# Patient Record
Sex: Female | Born: 1965 | Race: White | Hispanic: No | Marital: Married | Smoking: Former smoker
Health system: Southern US, Community
[De-identification: ages and names within clinical notes are randomized; demographics above are authoritative.]

## PROBLEM LIST (undated history)

## (undated) DIAGNOSIS — Z9889 Other specified postprocedural states: Secondary | ICD-10-CM

## (undated) DIAGNOSIS — M1990 Unspecified osteoarthritis, unspecified site: Secondary | ICD-10-CM

## (undated) DIAGNOSIS — K219 Gastro-esophageal reflux disease without esophagitis: Secondary | ICD-10-CM

## (undated) DIAGNOSIS — T4145XA Adverse effect of unspecified anesthetic, initial encounter: Secondary | ICD-10-CM

## (undated) DIAGNOSIS — R112 Nausea with vomiting, unspecified: Secondary | ICD-10-CM

## (undated) DIAGNOSIS — F32A Depression, unspecified: Secondary | ICD-10-CM

## (undated) DIAGNOSIS — B019 Varicella without complication: Secondary | ICD-10-CM

## (undated) DIAGNOSIS — F329 Major depressive disorder, single episode, unspecified: Secondary | ICD-10-CM

## (undated) DIAGNOSIS — T8859XA Other complications of anesthesia, initial encounter: Secondary | ICD-10-CM

## (undated) DIAGNOSIS — R05 Cough: Secondary | ICD-10-CM

## (undated) DIAGNOSIS — G43909 Migraine, unspecified, not intractable, without status migrainosus: Secondary | ICD-10-CM

## (undated) HISTORY — DX: Varicella without complication: B01.9

## (undated) HISTORY — DX: Unspecified osteoarthritis, unspecified site: M19.90

## (undated) HISTORY — DX: Migraine, unspecified, not intractable, without status migrainosus: G43.909

## (undated) HISTORY — PX: APPENDECTOMY: SHX54

## (undated) HISTORY — PX: TUBAL LIGATION: SHX77

---
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
## 1998-01-12 ENCOUNTER — Encounter: Payer: Self-pay | Admitting: Emergency Medicine

## 1998-01-12 ENCOUNTER — Emergency Department (HOSPITAL_COMMUNITY): Admission: EM | Admit: 1998-01-12 | Discharge: 1998-01-12 | Payer: Self-pay | Admitting: Emergency Medicine

## 2000-03-28 ENCOUNTER — Other Ambulatory Visit: Admission: RE | Admit: 2000-03-28 | Discharge: 2000-03-28 | Payer: Self-pay | Admitting: Internal Medicine

## 2001-03-29 ENCOUNTER — Other Ambulatory Visit: Admission: RE | Admit: 2001-03-29 | Discharge: 2001-03-29 | Payer: Self-pay | Admitting: Internal Medicine

## 2002-09-26 ENCOUNTER — Other Ambulatory Visit: Admission: RE | Admit: 2002-09-26 | Discharge: 2002-09-26 | Payer: Self-pay | Admitting: Internal Medicine

## 2003-09-30 ENCOUNTER — Emergency Department (HOSPITAL_COMMUNITY): Admission: EM | Admit: 2003-09-30 | Discharge: 2003-09-30 | Payer: Self-pay | Admitting: Emergency Medicine

## 2003-11-27 ENCOUNTER — Other Ambulatory Visit: Admission: RE | Admit: 2003-11-27 | Discharge: 2003-11-27 | Payer: Self-pay | Admitting: Internal Medicine

## 2004-01-07 ENCOUNTER — Encounter: Admission: RE | Admit: 2004-01-07 | Discharge: 2004-01-07 | Payer: Self-pay | Admitting: Internal Medicine

## 2004-05-21 ENCOUNTER — Ambulatory Visit (HOSPITAL_COMMUNITY): Admission: RE | Admit: 2004-05-21 | Discharge: 2004-05-21 | Payer: Self-pay | Admitting: Obstetrics and Gynecology

## 2004-06-20 ENCOUNTER — Emergency Department (HOSPITAL_COMMUNITY): Admission: EM | Admit: 2004-06-20 | Discharge: 2004-06-20 | Payer: Self-pay | Admitting: Emergency Medicine

## 2004-08-02 ENCOUNTER — Emergency Department (HOSPITAL_COMMUNITY): Admission: EM | Admit: 2004-08-02 | Discharge: 2004-08-02 | Payer: Self-pay | Admitting: Emergency Medicine

## 2005-03-01 HISTORY — PX: CERVICAL ABLATION: SHX5771

## 2005-08-03 ENCOUNTER — Other Ambulatory Visit: Admission: RE | Admit: 2005-08-03 | Discharge: 2005-08-03 | Payer: Self-pay | Admitting: Internal Medicine

## 2005-08-30 ENCOUNTER — Other Ambulatory Visit: Admission: RE | Admit: 2005-08-30 | Discharge: 2005-08-30 | Payer: Self-pay | Admitting: Obstetrics and Gynecology

## 2005-10-03 ENCOUNTER — Emergency Department (HOSPITAL_COMMUNITY): Admission: EM | Admit: 2005-10-03 | Discharge: 2005-10-03 | Payer: Self-pay | Admitting: Emergency Medicine

## 2006-10-19 ENCOUNTER — Other Ambulatory Visit: Admission: RE | Admit: 2006-10-19 | Discharge: 2006-10-19 | Payer: Self-pay | Admitting: Internal Medicine

## 2007-07-11 ENCOUNTER — Encounter: Admission: RE | Admit: 2007-07-11 | Discharge: 2007-07-11 | Payer: Self-pay | Admitting: Internal Medicine

## 2007-09-27 ENCOUNTER — Encounter: Admission: RE | Admit: 2007-09-27 | Discharge: 2007-09-27 | Payer: Self-pay | Admitting: Internal Medicine

## 2007-10-10 ENCOUNTER — Encounter: Admission: RE | Admit: 2007-10-10 | Discharge: 2007-10-10 | Payer: Self-pay | Admitting: Internal Medicine

## 2007-11-20 ENCOUNTER — Emergency Department (HOSPITAL_COMMUNITY): Admission: EM | Admit: 2007-11-20 | Discharge: 2007-11-20 | Payer: Self-pay | Admitting: Emergency Medicine

## 2008-08-05 ENCOUNTER — Encounter: Admission: RE | Admit: 2008-08-05 | Discharge: 2008-08-05 | Payer: Self-pay | Admitting: Internal Medicine

## 2008-11-14 ENCOUNTER — Ambulatory Visit (HOSPITAL_BASED_OUTPATIENT_CLINIC_OR_DEPARTMENT_OTHER): Admission: RE | Admit: 2008-11-14 | Discharge: 2008-11-14 | Payer: Self-pay | Admitting: Urology

## 2009-01-06 ENCOUNTER — Emergency Department (HOSPITAL_BASED_OUTPATIENT_CLINIC_OR_DEPARTMENT_OTHER): Admission: EM | Admit: 2009-01-06 | Discharge: 2009-01-06 | Payer: Self-pay | Admitting: Emergency Medicine

## 2009-07-09 ENCOUNTER — Other Ambulatory Visit: Admission: RE | Admit: 2009-07-09 | Discharge: 2009-07-09 | Payer: Self-pay | Admitting: Internal Medicine

## 2010-02-09 ENCOUNTER — Encounter
Admission: RE | Admit: 2010-02-09 | Discharge: 2010-02-09 | Payer: Self-pay | Source: Home / Self Care | Attending: Internal Medicine | Admitting: Internal Medicine

## 2010-03-21 ENCOUNTER — Encounter: Payer: Self-pay | Admitting: Internal Medicine

## 2010-03-22 ENCOUNTER — Encounter: Payer: Self-pay | Admitting: Internal Medicine

## 2010-07-14 ENCOUNTER — Other Ambulatory Visit: Payer: Self-pay | Admitting: Internal Medicine

## 2010-07-14 ENCOUNTER — Other Ambulatory Visit (HOSPITAL_COMMUNITY)
Admission: RE | Admit: 2010-07-14 | Discharge: 2010-07-14 | Disposition: A | Discharge status: Home / Self Care | Payer: Self-pay | Source: Ambulatory Visit | Attending: Internal Medicine | Admitting: Internal Medicine

## 2010-07-14 DIAGNOSIS — Z01419 Encounter for gynecological examination (general) (routine) without abnormal findings: Secondary | ICD-10-CM | POA: Insufficient documentation

## 2010-07-17 NOTE — Op Note (Signed)
NAMEFLAVIA, BRUSS                ACCOUNT NO.:  [id number]   MEDICAL RECORD NO.:  [id number]          PATIENT TYPE:  AMB   LOCATION:  SDC                           FACILITY:  [facility]   PHYSICIAN:  Charles A. Delcambre, MDDATE OF BIRTH:  [birth year]   DATE OF PROCEDURE:  05/21/2004  DATE OF DISCHARGE:                                 OPERATIVE REPORT   PREOPERATIVE DIAGNOSIS:  Menorrhagia.   POSTOPERATIVE DIAGNOSIS:  Menorrhagia.   PROCEDURE:  Thermachoice endometrial ablation.   SURGEON:  Dr. Sydnee Cabal   ASSISTANT:  None.   COMPLICATIONS:  None.   ESTIMATED BLOOD LOSS:  Minimal.   SPECIMENS:  None.   ANESTHESIA:  General via endotracheal route.   COUNTS:  Sponge and instrument count correct.   DESCRIPTION OF PROCEDURE:  The patient was taken to the operating room and  placed in supine position and anesthesia given without difficulty.  She was  then placed in dorsolithotomy position in universal stirrups, and sterile  prep and draping was done.  A single-tooth tenaculum was placed on the  anterior lip of the cervix.  A weighted speculum was placed in the vagina.  Sound was noted to 8 cm.  Thermochoice instrument was used with Thermochoice  protocol.  With pressures between 160 and 180 mmHg, heating cycle was  followed, and then 8 minutes of treatment cycle was followed with pressures  noted to be maintained above 150 mmHg during this cycle.  The procedure was  terminated.  Cool down cycle was followed, and instruments were removed.  There was no evidence of perforation during sounding or treatment with the  Thermochoice.  All instruments were removed, and patient was awakened and  taken to recovery room with the physician in attendance, having tolerated  the procedure well.      CAD/MEDQ  D:  05/21/2004  T:  05/21/2004  Job:  [id number]

## 2010-07-17 NOTE — H&P (Signed)
Brittany Pham, Brittany Pham                ACCOUNT NO.:  [id number]   MEDICAL RECORD NO.:  [id number]           PATIENT TYPE:   LOCATION:                                 FACILITY:   PHYSICIAN:  Charles A. Delcambre, MDDATE OF BIRTH:  August 04, 1965   DATE OF ADMISSION:  05/21/2004  DATE OF DISCHARGE:                                HISTORY & PHYSICAL   REASON FOR ADMISSION:  ThermaChoice endometrial ablation for menorrhagia.   HISTORY OF PRESENT ILLNESS:  She is a 45 year old gravida 4, para 2-0-2-2,  periods regular every 28 days, gone to 21 days now over the last several  months and very heavy over the last two months.  She had two periods in  January, and then again after about 10-15 days in February lasting 9 days.  She has become increasingly fatigued over the last several months.  Hemoglobin 04/30/2004, however, was 14.1.  TSH was 1.48.   PAST MEDICAL HISTORY:  1.  Depression.  2.  Anxiety.  3.  Chronic back aches.   SURGICAL HISTORY:  Tubal ligation, nose surgery.   OBSTETRICAL HISTORY:  SVD x2.   MEDICATIONS:  1.  Citalopram 20 mg daily for depression.  2.  Xanax p.r.n., dose not specified.  3.  Ibuprofen p.r.n. for back ache.   ALLERGIES:  No known drug allergies.   SOCIAL HISTORY:  One half pack per day [place] for four years.  Denies  alcohol or drug use or STD exposure in the past.  Does not state sexual  activity or spouse.   FAMILY HISTORY:  Father deceased of colon question, questionable  hypertension.  Deceased, she states now, myocardial infarction, but also  colon cancer in the mid 72's.  Hypertension also.  Mother 78 with ruptured  disks in the back.  Sister 99 in good health.  Otherwise, negative family  history.   REVIEW OF SYSTEMS:  Some hot flashes and malaise in addition to things noted  positive above.  Otherwise, no fever, chills, rashes, lesions, headaches,  dizziness, chest pain, shortness of breath, wheezing, diarrhea,  constipation, bleeding,  melena, hematochezia, urgency, frequency, dysuria,  incontinence, hematuria, galacturia, emotional changes.   PHYSICAL EXAMINATION:  GENERAL APPEARANCE:  Alert and oriented x3.  Blood  pressure 110/70, respirations 16, pulse 76.  Weight 149 pounds, height 5  feet 4-1/2 inches.  HEENT:  Grossly within normal limits.  NECK:  Without thyromegaly or adenopathy.  LUNGS:  Clear bilaterally.  HEART:  Regular rate without murmurs, rubs or gallops.  BREASTS:  Symmetrical, otherwise, deferred.  Reports normal.  Exam last fall  with PCP on 10/2003, she has not had her mammogram.  ABDOMEN:  Soft but nontender.  No hepatosplenomegaly.  No other masses  noted.  PELVIC:  Normal external female genitalia.  Bartholin, urethral, Skene  normal.  Vault without discharge lesions.  No cervical motion tenderness  present.  Multipara cervix.  Uterus anteverted, not enlarged.  Adnexa not  tender without masses.  Bilateral ovaries normal size.  Ultrasound  examination consistent with physical findings.  Small follicular cyst.  Less  than 1.5  cm of the right ovary and left less than 1 cm.  Endometrial  thickness, 0.5 cm now in the week after menstrual cycle.   ASSESSMENT:  Menorrhagia.   PLAN:  ThermaChoice endometrial ablation.  Informed consent was given.  She  accepts risks of infection, bleeding, uterine perforation, failure of  endometrial ablation.  Failure of procedure, as well as, failure to control  heavy bleeding.  Preoperative UCG will be done.  All questions were  answered.  She will remain n.p.o. past midnight for solid foods.  Otherwise,  clear liquids have until 500.  Procedure scheduled for 05/21/2004.  ThermaChoice literature was given.  Questions were answered, and we will  proceed as outlined.  Endometrial biopsy is done today per protocol.   ADDENDUM:  Endometrial biopsy is done today per protocol.  Sound was to 8  cm.  No evidence perforation.  Patient tolerated procedure well.       CAD/MEDQ  D:  05/14/2004  T:  05/14/2004  Job:  [id number]

## 2010-11-30 LAB — URINALYSIS, ROUTINE W REFLEX MICROSCOPIC
Glucose, UA: NEGATIVE
Nitrite: NEGATIVE
pH: 6

## 2010-11-30 LAB — COMPREHENSIVE METABOLIC PANEL
ALT: 17
AST: 17
Alkaline Phosphatase: 53
CO2: 25
GFR calc Af Amer: >60
Glucose, Bld: 109 — ABNORMAL HIGH
Potassium: 3.5
Sodium: 133 — ABNORMAL LOW
Total Protein: 6.5

## 2010-11-30 LAB — CBC
Hemoglobin: 14.6
RBC: 4.64
RDW: 13.5

## 2010-11-30 LAB — DIFFERENTIAL
Basophils Relative: 0
Eosinophils Absolute: 0.1
Eosinophils Relative: 1
Lymphs Abs: 0.7
Monocytes Relative: 1 — ABNORMAL LOW
Neutrophils Relative %: 93 — ABNORMAL HIGH

## 2010-11-30 LAB — PREGNANCY, URINE: Preg Test, Ur: NEGATIVE

## 2011-02-17 ENCOUNTER — Other Ambulatory Visit: Payer: Self-pay | Admitting: Internal Medicine

## 2011-02-17 DIAGNOSIS — Z1231 Encounter for screening mammogram for malignant neoplasm of breast: Secondary | ICD-10-CM

## 2011-03-08 ENCOUNTER — Ambulatory Visit
Admission: RE | Admit: 2011-03-08 | Discharge: 2011-03-08 | Disposition: A | Discharge status: Home / Self Care | Payer: BC Managed Care – PPO | Source: Ambulatory Visit | Attending: Internal Medicine | Admitting: Internal Medicine

## 2011-03-08 DIAGNOSIS — Z1231 Encounter for screening mammogram for malignant neoplasm of breast: Secondary | ICD-10-CM

## 2011-09-09 ENCOUNTER — Other Ambulatory Visit: Payer: Self-pay | Admitting: Internal Medicine

## 2011-09-09 ENCOUNTER — Other Ambulatory Visit (HOSPITAL_COMMUNITY)
Admission: RE | Admit: 2011-09-09 | Discharge: 2011-09-09 | Disposition: A | Discharge status: Home / Self Care | Payer: BC Managed Care – PPO | Source: Ambulatory Visit | Attending: Internal Medicine | Admitting: Internal Medicine

## 2011-09-09 DIAGNOSIS — Z01419 Encounter for gynecological examination (general) (routine) without abnormal findings: Secondary | ICD-10-CM | POA: Insufficient documentation

## 2011-09-09 DIAGNOSIS — K30 Functional dyspepsia: Secondary | ICD-10-CM

## 2011-09-16 ENCOUNTER — Inpatient Hospital Stay: Admission: RE | Admit: 2011-09-16 | Payer: BC Managed Care – PPO | Source: Ambulatory Visit

## 2012-03-01 HISTORY — PX: CERVICAL SPINE SURGERY: SHX589

## 2012-04-20 ENCOUNTER — Other Ambulatory Visit: Payer: Self-pay | Admitting: Surgery

## 2012-06-19 ENCOUNTER — Other Ambulatory Visit: Payer: Self-pay

## 2012-06-19 DIAGNOSIS — Z1231 Encounter for screening mammogram for malignant neoplasm of breast: Secondary | ICD-10-CM

## 2012-07-14 ENCOUNTER — Ambulatory Visit
Admission: RE | Admit: 2012-07-14 | Discharge: 2012-07-14 | Disposition: A | Discharge status: Home / Self Care | Payer: BC Managed Care – PPO | Source: Ambulatory Visit

## 2012-07-14 DIAGNOSIS — Z1231 Encounter for screening mammogram for malignant neoplasm of breast: Secondary | ICD-10-CM

## 2012-09-14 ENCOUNTER — Other Ambulatory Visit (HOSPITAL_COMMUNITY)
Admission: RE | Admit: 2012-09-14 | Discharge: 2012-09-14 | Disposition: A | Discharge status: Home / Self Care | Payer: BC Managed Care – PPO | Source: Ambulatory Visit | Attending: Internal Medicine | Admitting: Internal Medicine

## 2012-09-14 ENCOUNTER — Other Ambulatory Visit: Payer: Self-pay | Admitting: Internal Medicine

## 2012-09-14 DIAGNOSIS — Z1151 Encounter for screening for human papillomavirus (HPV): Secondary | ICD-10-CM | POA: Insufficient documentation

## 2012-09-14 DIAGNOSIS — Z01419 Encounter for gynecological examination (general) (routine) without abnormal findings: Secondary | ICD-10-CM | POA: Insufficient documentation

## 2012-09-22 ENCOUNTER — Ambulatory Visit
Admission: RE | Admit: 2012-09-22 | Discharge: 2012-09-22 | Disposition: A | Discharge status: Home / Self Care | Payer: BC Managed Care – PPO | Source: Ambulatory Visit | Attending: Internal Medicine | Admitting: Internal Medicine

## 2012-09-22 DIAGNOSIS — K30 Functional dyspepsia: Secondary | ICD-10-CM

## 2012-12-25 ENCOUNTER — Emergency Department (HOSPITAL_BASED_OUTPATIENT_CLINIC_OR_DEPARTMENT_OTHER)
Admission: EM | Admit: 2012-12-25 | Discharge: 2012-12-25 | Disposition: A | Discharge status: Home / Self Care | Payer: BC Managed Care – PPO | Attending: Emergency Medicine | Admitting: Emergency Medicine

## 2012-12-25 ENCOUNTER — Emergency Department (HOSPITAL_BASED_OUTPATIENT_CLINIC_OR_DEPARTMENT_OTHER): Payer: BC Managed Care – PPO

## 2012-12-25 ENCOUNTER — Encounter (HOSPITAL_BASED_OUTPATIENT_CLINIC_OR_DEPARTMENT_OTHER): Payer: Self-pay | Admitting: Emergency Medicine

## 2012-12-25 DIAGNOSIS — R5381 Other malaise: Secondary | ICD-10-CM | POA: Insufficient documentation

## 2012-12-25 DIAGNOSIS — F3289 Other specified depressive episodes: Secondary | ICD-10-CM | POA: Insufficient documentation

## 2012-12-25 DIAGNOSIS — R531 Weakness: Secondary | ICD-10-CM

## 2012-12-25 DIAGNOSIS — R61 Generalized hyperhidrosis: Secondary | ICD-10-CM | POA: Insufficient documentation

## 2012-12-25 DIAGNOSIS — F17200 Nicotine dependence, unspecified, uncomplicated: Secondary | ICD-10-CM | POA: Insufficient documentation

## 2012-12-25 DIAGNOSIS — Z79899 Other long term (current) drug therapy: Secondary | ICD-10-CM | POA: Insufficient documentation

## 2012-12-25 DIAGNOSIS — R110 Nausea: Secondary | ICD-10-CM | POA: Insufficient documentation

## 2012-12-25 DIAGNOSIS — IMO0002 Reserved for concepts with insufficient information to code with codable children: Secondary | ICD-10-CM | POA: Insufficient documentation

## 2012-12-25 DIAGNOSIS — F329 Major depressive disorder, single episode, unspecified: Secondary | ICD-10-CM | POA: Insufficient documentation

## 2012-12-25 HISTORY — DX: Major depressive disorder, single episode, unspecified: F32.9

## 2012-12-25 HISTORY — DX: Depression, unspecified: F32.A

## 2012-12-25 LAB — URINE MICROSCOPIC-ADD ON

## 2012-12-25 LAB — CBC WITH DIFFERENTIAL/PLATELET
Basophils Absolute: 0 10*3/uL (ref 0.0–0.1)
Lymphs Abs: 6 10*3/uL — ABNORMAL HIGH (ref 0.7–4.0)
MCV: 89.6 fL (ref 78.0–100.0)
Monocytes Absolute: 0.9 10*3/uL (ref 0.1–1.0)
Monocytes Relative: 8 % (ref 3–12)
Neutrophils Relative %: 39 % — ABNORMAL LOW (ref 43–77)
Platelets: 403 10*3/uL — ABNORMAL HIGH (ref 150–400)
RDW: 13.7 % (ref 11.5–15.5)
WBC: 11.5 10*3/uL — ABNORMAL HIGH (ref 4.0–10.5)

## 2012-12-25 LAB — BASIC METABOLIC PANEL
BUN: 20 mg/dL (ref 6–23)
Calcium: 9.2 mg/dL (ref 8.4–10.5)
Chloride: 102 mEq/L (ref 96–112)
Creatinine, Ser: 1 mg/dL (ref 0.50–1.10)
GFR calc Af Amer: 78 mL/min — ABNORMAL LOW (ref >90)
GFR calc non Af Amer: 67 mL/min — ABNORMAL LOW (ref >90)
Potassium: 3.9 mEq/L (ref 3.5–5.1)
Sodium: 138 mEq/L (ref 135–145)

## 2012-12-25 LAB — URINALYSIS, ROUTINE W REFLEX MICROSCOPIC
Hgb urine dipstick: NEGATIVE
Protein, ur: NEGATIVE mg/dL
Urobilinogen, UA: 0.2 mg/dL (ref 0.0–1.0)
pH: 6 (ref 5.0–8.0)

## 2012-12-25 LAB — GLUCOSE, CAPILLARY: Glucose-Capillary: 69 mg/dL — ABNORMAL LOW (ref 70–99)

## 2012-12-25 MED ORDER — SODIUM CHLORIDE 0.9 % IV SOLN: 1000.0000 mL | INTRAVENOUS | Status: DC

## 2012-12-25 MED ORDER — SODIUM CHLORIDE 0.9 % IV SOLN
1000.0000 mL | Freq: Once | INTRAVENOUS | Status: AC
  Administered 2012-12-25: 1000 mL via INTRAVENOUS

## 2012-12-25 NOTE — ED Notes (Signed)
Patient transported to X-ray 

## 2012-12-25 NOTE — ED Notes (Signed)
Per EMS:  Pt went outside to smoke.  Got diaphoretic, dizzy.  Lasted about an hour.  Denies syncopal episode.  Pt being treated for sinus infection.  Pt is nauseated and has dry heaved.

## 2012-12-25 NOTE — ED Provider Notes (Signed)
CSN: [id number]     Arrival date & time 12/25/12  1036 History   First MD Initiated Contact with Patient 12/25/12 1145     Chief Complaint  Patient presents with  . Dizziness   (Consider location/radiation/quality/duration/timing/severity/associated sxs/prior Treatment) HPI A 47 year old female presents today after an episode of feeling lightheaded and sweaty. She has had upper respiratory infection symptoms have been treated for a sinus infection with moxifloxacin. She was at work today when she went outside to smoke and then began to feel generally weak, slightly nauseated, and became somewhat sweaty. She feels somewhat improved here but still feels generally weak. She's had no vision changes, difficulty speaking, lateralized weakness. She has no history of similar symptoms. Denies fever, chills, chest pain, dyspnea, or abdominal pain. Past Medical History  Diagnosis Date  . Depression    History reviewed. No pertinent past surgical history. History reviewed. No pertinent family history. History  Substance Use Topics  . Smoking status: Current Every Day Smoker  . Smokeless tobacco: Not on file  . Alcohol Use: No   OB History   Grav Para Term Preterm Abortions TAB SAB Ect Mult Living                 Review of Systems  All other systems reviewed and are negative.    Allergies  Review of patient's allergies indicates no known allergies.  Home Medications   Current Outpatient Rx  Name  Route  Sig  Dispense  Refill  . DULoxetine (CYMBALTA) 20 MG capsule   Oral   Take 20 mg by mouth daily.         . predniSONE (STERAPRED UNI-PAK) 10 MG tablet   Oral   Take 10 mg by mouth daily.          BP 115/79  Pulse 95  Temp(Src) 98 F (36.7 C) (Oral)  Resp 20  SpO2 98% Physical Exam  Nursing note and vitals reviewed. Constitutional: She is oriented to person, place, and time. She appears well-developed and well-nourished.  HENT:  Head: Normocephalic and atraumatic.   Right Ear: Tympanic membrane and external ear normal.  Left Ear: Tympanic membrane and external ear normal.  Nose: Nose normal. Right sinus exhibits no maxillary sinus tenderness and no frontal sinus tenderness. Left sinus exhibits no maxillary sinus tenderness and no frontal sinus tenderness.  Eyes: Conjunctivae and EOM are normal. Pupils are equal, round, and reactive to light. Right eye exhibits no nystagmus. Left eye exhibits no nystagmus.  Neck: Normal range of motion. Neck supple.  Cardiovascular: Normal rate, regular rhythm, normal heart sounds and intact distal pulses.   Pulmonary/Chest: Effort normal and breath sounds normal. No respiratory distress. She exhibits no tenderness.  Abdominal: Soft. Bowel sounds are normal. She exhibits no distension and no mass. There is no tenderness.  Musculoskeletal: Normal range of motion. She exhibits no edema and no tenderness.  Neurological: She is alert and oriented to person, place, and time. She has normal strength and normal reflexes. No sensory deficit. She displays a negative Romberg sign. GCS eye subscore is 4. GCS verbal subscore is 5. GCS motor subscore is 6.  Reflex Scores:      Tricep reflexes are 2+ on the right side and 2+ on the left side.      Bicep reflexes are 2+ on the right side and 2+ on the left side.      Brachioradialis reflexes are 2+ on the right side and 2+ on the left side.  Patellar reflexes are 2+ on the right side and 2+ on the left side.      Achilles reflexes are 2+ on the right side and 2+ on the left side. Patient with normal gait without ataxia, shuffling, spasm, or antalgia. Speech is normal without dysarthria, dysphasia, or aphasia. Muscle strength is 5/5 in bilateral shoulders, elbow flexor and extensors, wrist flexor and extensors, and intrinsic hand muscles. 5/5 bilateral lower extremity hip flexors, extensors, knee flexors and extensors, and ankle dorsi and plantar flexors.    Skin: Skin is warm and dry.  No rash noted.  Psychiatric: She has a normal mood and affect. Her behavior is normal. Judgment and thought content normal.    ED Course  Procedures (including critical care time) Labs Review Labs Reviewed  URINALYSIS, ROUTINE W REFLEX MICROSCOPIC - Abnormal; Notable for the following:    APPearance CLOUDY (*)    Leukocytes, UA LARGE (*)    All other components within normal limits  CBC WITH DIFFERENTIAL - Abnormal; Notable for the following:    WBC 11.5 (*)    Platelets 403 (*)    Neutrophils Relative % 39 (*)    Lymphocytes Relative 52 (*)    Lymphs Abs 6.0 (*)    All other components within normal limits  BASIC METABOLIC PANEL - Abnormal; Notable for the following:    GFR calc non Af Amer 67 (*)    GFR calc Af Amer 78 (*)    All other components within normal limits  GLUCOSE, CAPILLARY - Abnormal; Notable for the following:    Glucose-Capillary 69 (*)    All other components within normal limits  URINE MICROSCOPIC-ADD ON - Abnormal; Notable for the following:    Squamous Epithelial / LPF MANY (*)    Bacteria, UA FEW (*)    All other components within normal limits  URINE CULTURE  CBC  PATHOLOGIST SMEAR REVIEW  POCT CBG (FASTING - GLUCOSE)-MANUAL ENTRY   Imaging Review No results found.  EKG Interpretation     Ventricular Rate:  72 PR Interval:  132 QRS Duration: 78 QT Interval:  394 QTC Calculation: 431 R Axis:   27 Text Interpretation:  Normal sinus rhythm Normal ECG            MDM  No diagnosis found. Patient with generalized weakness with some orthostatic changes. EKG and labs all appear basically normal. The patient has received a normal saline and continues to feel improved from her prior episode although not her usual self. She denies any specific pain and has a normal neurological exam. He does not have any UTI symptoms and is on the seventh day of moxifloxacin. She's advised return if she feels worse anytime and to followup with her primary care  Dr. Given her atypical lymphocytosis I think this is possibly a viral syndrome, but again she is advised to return or she is worse at any time.   Hilario Quarry, MD 12/25/12 [phone]

## 2012-12-26 LAB — URINE CULTURE: Colony Count: 8000

## 2012-12-27 LAB — PATHOLOGIST SMEAR REVIEW

## 2013-03-28 ENCOUNTER — Other Ambulatory Visit: Payer: Self-pay | Admitting: Chiropractic Medicine

## 2013-03-28 ENCOUNTER — Ambulatory Visit
Admission: RE | Admit: 2013-03-28 | Discharge: 2013-03-28 | Disposition: A | Discharge status: Home / Self Care | Payer: BC Managed Care – PPO | Source: Ambulatory Visit | Attending: Chiropractic Medicine | Admitting: Chiropractic Medicine

## 2013-03-28 DIAGNOSIS — M542 Cervicalgia: Secondary | ICD-10-CM

## 2013-09-19 ENCOUNTER — Other Ambulatory Visit: Payer: Self-pay

## 2013-09-19 DIAGNOSIS — Z1231 Encounter for screening mammogram for malignant neoplasm of breast: Secondary | ICD-10-CM

## 2013-09-24 ENCOUNTER — Ambulatory Visit
Admission: RE | Admit: 2013-09-24 | Discharge: 2013-09-24 | Disposition: A | Discharge status: Home / Self Care | Payer: BC Managed Care – PPO | Source: Ambulatory Visit

## 2013-09-24 DIAGNOSIS — Z1231 Encounter for screening mammogram for malignant neoplasm of breast: Secondary | ICD-10-CM

## 2014-09-08 ENCOUNTER — Encounter (HOSPITAL_BASED_OUTPATIENT_CLINIC_OR_DEPARTMENT_OTHER): Payer: Self-pay | Admitting: Emergency Medicine

## 2014-09-08 ENCOUNTER — Emergency Department (HOSPITAL_BASED_OUTPATIENT_CLINIC_OR_DEPARTMENT_OTHER): Payer: BLUE CROSS/BLUE SHIELD

## 2014-09-08 ENCOUNTER — Emergency Department (HOSPITAL_BASED_OUTPATIENT_CLINIC_OR_DEPARTMENT_OTHER)
Admission: EM | Admit: 2014-09-08 | Discharge: 2014-09-08 | Disposition: A | Discharge status: Home / Self Care | Payer: BLUE CROSS/BLUE SHIELD | Attending: Emergency Medicine | Admitting: Emergency Medicine

## 2014-09-08 DIAGNOSIS — F329 Major depressive disorder, single episode, unspecified: Secondary | ICD-10-CM | POA: Diagnosis not present

## 2014-09-08 DIAGNOSIS — Z720 Tobacco use: Secondary | ICD-10-CM | POA: Insufficient documentation

## 2014-09-08 DIAGNOSIS — R55 Syncope and collapse: Secondary | ICD-10-CM | POA: Insufficient documentation

## 2014-09-08 DIAGNOSIS — R197 Diarrhea, unspecified: Secondary | ICD-10-CM

## 2014-09-08 DIAGNOSIS — K529 Noninfective gastroenteritis and colitis, unspecified: Secondary | ICD-10-CM

## 2014-09-08 DIAGNOSIS — R109 Unspecified abdominal pain: Secondary | ICD-10-CM | POA: Diagnosis present

## 2014-09-08 DIAGNOSIS — R05 Cough: Secondary | ICD-10-CM | POA: Insufficient documentation

## 2014-09-08 DIAGNOSIS — E869 Volume depletion, unspecified: Secondary | ICD-10-CM | POA: Insufficient documentation

## 2014-09-08 DIAGNOSIS — Z7952 Long term (current) use of systemic steroids: Secondary | ICD-10-CM | POA: Insufficient documentation

## 2014-09-08 DIAGNOSIS — Z79899 Other long term (current) drug therapy: Secondary | ICD-10-CM | POA: Diagnosis not present

## 2014-09-08 DIAGNOSIS — R059 Cough, unspecified: Secondary | ICD-10-CM

## 2014-09-08 DIAGNOSIS — R112 Nausea with vomiting, unspecified: Secondary | ICD-10-CM

## 2014-09-08 LAB — CBC WITH DIFFERENTIAL/PLATELET
BASOS PCT: 0 % (ref 0–1)
Basophils Absolute: 0 10*3/uL (ref 0.0–0.1)
Eosinophils Absolute: 0.4 10*3/uL (ref 0.0–0.7)
Eosinophils Relative: 5 % (ref 0–5)
HEMATOCRIT: 41.7 % (ref 36.0–46.0)
Hemoglobin: 14.1 g/dL (ref 12.0–15.0)
Lymphocytes Relative: 39 % (ref 12–46)
Lymphs Abs: 3.1 10*3/uL (ref 0.7–4.0)
MCH: 30.2 pg (ref 26.0–34.0)
MCHC: 33.8 g/dL (ref 30.0–36.0)
MCV: 89.3 fL (ref 78.0–100.0)
MONO ABS: 0.7 10*3/uL (ref 0.1–1.0)
Monocytes Relative: 8 % (ref 3–12)
Neutro Abs: 3.8 10*3/uL (ref 1.7–7.7)
Neutrophils Relative %: 48 % (ref 43–77)
Platelets: 347 10*3/uL (ref 150–400)
RBC: 4.67 MIL/uL (ref 3.87–5.11)
RDW: 12.9 % (ref 11.5–15.5)
WBC: 8 10*3/uL (ref 4.0–10.5)

## 2014-09-08 LAB — BASIC METABOLIC PANEL
Anion gap: 8 (ref 5–15)
BUN: 6 mg/dL (ref 6–20)
CALCIUM: 8.1 mg/dL — AB (ref 8.9–10.3)
CO2: 25 mmol/L (ref 22–32)
Chloride: 105 mmol/L (ref 101–111)
Creatinine, Ser: 0.77 mg/dL (ref 0.44–1.00)
GFR calc Af Amer: >60 mL/min (ref >60)
GFR calc non Af Amer: >60 mL/min (ref >60)
Glucose, Bld: 108 mg/dL — ABNORMAL HIGH (ref 65–99)
Potassium: 3.6 mmol/L (ref 3.5–5.1)
Sodium: 138 mmol/L (ref 135–145)

## 2014-09-08 MED ORDER — MORPHINE SULFATE 2 MG/ML IJ SOLN
2.0000 mg | Freq: Once | INTRAMUSCULAR | Status: AC
  Administered 2014-09-08: 2 mg via INTRAVENOUS
  Filled 2014-09-08: qty 1

## 2014-09-08 MED ORDER — SODIUM CHLORIDE 0.9 % IV BOLUS (SEPSIS)
1000.0000 mL | Freq: Once | INTRAVENOUS | Status: AC
  Administered 2014-09-08: 1000 mL via INTRAVENOUS

## 2014-09-08 MED ORDER — METOCLOPRAMIDE HCL 5 MG/ML IJ SOLN
10.0000 mg | Freq: Once | INTRAMUSCULAR | Status: AC
  Administered 2014-09-08: 10 mg via INTRAVENOUS
  Filled 2014-09-08: qty 2

## 2014-09-08 MED ORDER — PROMETHAZINE HCL 25 MG PO TABS: 25.0000 mg | ORAL_TABLET | Freq: Four times a day (QID) | ORAL | Status: DC | PRN

## 2014-09-08 MED ORDER — ONDANSETRON HCL 4 MG/2ML IJ SOLN
4.0000 mg | Freq: Once | INTRAMUSCULAR | Status: AC
  Administered 2014-09-08: 4 mg via INTRAVENOUS
  Filled 2014-09-08: qty 2

## 2014-09-08 NOTE — ED Notes (Signed)
Pt updated, denies pain or nausea at this time, pt alert, NAD, calm, interactive, no dyspnea noted, returned to monitor, VSS, family at [facility].

## 2014-09-08 NOTE — ED Provider Notes (Signed)
CSN: [id number]     Arrival date & time 09/08/14  1497 History  This chart was scribed for Pattricia Boss, MD by Chester Holstein, ED Scribe. This patient was seen in room MH11/MH11 and the patient's care was started at 6:50 PM.    Chief Complaint  Patient presents with  . Abdominal Pain     The history is provided by the patient and the spouse. No language interpreter was used.   HPI Comments: AUNIKA KIRSTEN is a 49 y.o. female who presents to the Emergency Department complaining of abdominal pain with onset 2 days ago. She notes productive cough with green sputum and sore throat with onset 4 days ago. Pt was seen at an urgent care 2 days ago and treated for URI with prednisone, inhaler, and Amoxicillin with resolution of sore throat at that time.  Husband reports syncopal episode following administration of prednisone. Pt presenting today with dysphonia, nausea, vomiting, and diarrhea with onset 2 days ago. Pt is a smoker. Pt denies any other recent Abx use. She denies fever and chills. No recent sick contacts.    Past Medical History  Diagnosis Date  . Depression    History reviewed. No pertinent past surgical history. History reviewed. No pertinent family history. History  Substance Use Topics  . Smoking status: Current Every Day Smoker  . Smokeless tobacco: Not on file  . Alcohol Use: No   OB History    No data available     Review of Systems  Constitutional: Negative for fever and chills.  HENT: Positive for sore throat (resolved) and voice change.   Respiratory: Positive for cough.   Gastrointestinal: Positive for nausea, vomiting, abdominal pain and diarrhea.  Neurological: Positive for syncope.  All other systems reviewed and are negative.     Allergies  Erythromycin and Prednisone  Home Medications   Prior to Admission medications   Medication Sig Start Date End Date Taking? Authorizing Provider  DULoxetine (CYMBALTA) 20 MG capsule Take 20 mg by mouth daily.     Historical Provider, MD  predniSONE (STERAPRED UNI-PAK) 10 MG tablet Take 10 mg by mouth daily.    Historical Provider, MD   BP 115/91 mmHg  Pulse 115  Temp(Src) 98.2 F (36.8 C) (Oral)  Resp 18  SpO2 97% Physical Exam  Constitutional: She is oriented to person, place, and time. She appears well-developed and well-nourished.  HENT:  Head: Normocephalic.  Eyes: Conjunctivae are normal.  Neck: Normal range of motion. Neck supple.  Pulmonary/Chest: Effort normal.  Musculoskeletal: Normal range of motion.  Neurological: She is alert and oriented to person, place, and time.  Skin: Skin is warm and dry.  Psychiatric: She has a normal mood and affect. Her behavior is normal.  Nursing note and vitals reviewed.   ED Course  Procedures (including critical care time) DIAGNOSTIC STUDIES: Oxygen Saturation is 97% on room air, normal by my interpretation.    COORDINATION OF CARE: 6:57 PM Discussed treatment plan with patient at beside, the patient agrees with the plan and has no further questions at this time.   Labs Review Labs Reviewed  CBC WITH DIFFERENTIAL/PLATELET  BASIC METABOLIC PANEL    Imaging Review No results found.   EKG Interpretation None      MDM   Final diagnoses:  Cough  Nausea vomiting and diarrhea  Gastroenteritis  Volume depletion   49 year old female who comes in today stating that she has had URI symptoms began on Thursday followed by nausea vomiting or  diarrhea. She was seen on Friday and started on amoxicillin. She has vomited multiple times today and had multiple episodes diarrhea. She feels generally weak. She reports a syncopal episode on Friday. She is given IV hydration here has labs and chest x-Tattiana Fakhouri that are normal. She is advised on oral rehydration, follow-up, and return precautions and voices understanding. I personally performed the services described in this documentation, which was scribed in my presence. The recorded information has been  reviewed and considered. t}     Pattricia Boss, MD 09/08/14 2249

## 2014-09-08 NOTE — ED Notes (Signed)
Up to b/r, steady gait, denies pain nausea dizziness or other sx. No changes, family at [facility], VSS.

## 2014-09-08 NOTE — ED Notes (Signed)
patioent was treated at an urgent care on Friday for an URI and syncopal episodes. The patient reports that she feels weak all over and is unable to "keep anything down"

## 2014-09-08 NOTE — Discharge Instructions (Signed)
Viral Gastroenteritis Viral gastroenteritis is also called stomach flu. This illness is caused by a certain type of germ (virus). It can cause sudden watery poop (diarrhea) and throwing up (vomiting). This can cause you to lose body fluids (dehydration). This illness usually lasts for 3 to 8 days. It usually goes away on its own. HOME CARE   Drink enough fluids to keep your pee (urine) clear or pale yellow. Drink small amounts of fluids often.  Ask your doctor how to replace body fluid losses (rehydration).  Avoid:  Foods high in sugar.  Alcohol.  Bubbly (carbonated) drinks.  Tobacco.  Juice.  Caffeine drinks.  Very hot or cold fluids.  Fatty, greasy foods.  Eating too much at one time.  Dairy products until 24 to 48 hours after your watery poop stops.  You may eat foods with active cultures (probiotics). They can be found in some yogurts and supplements.  Wash your hands well to avoid spreading the illness.  Only take medicines as told by your doctor. Do not give aspirin to children. Do not take medicines for watery poop (antidiarrheals).  Ask your doctor if you should keep taking your regular medicines.  Keep all doctor visits as told. GET HELP RIGHT AWAY IF:   You cannot keep fluids down.  You do not pee at least once every 6 to 8 hours.  You are short of breath.  You see blood in your poop or throw up. This may look like coffee grounds.  You have belly (abdominal) pain that gets worse or is just in one small spot (localized).  You keep throwing up or having watery poop.  You have a fever.  The patient is a child younger than 3 months, and he or she has a fever.  The patient is a child older than 3 months, and he or she has a fever and problems that do not go away.  The patient is a child older than 3 months, and he or she has a fever and problems that suddenly get worse.  The patient is a baby, and he or she has no tears when crying. MAKE SURE YOU:     Understand these instructions.  Will watch your condition.  Will get help right away if you are not doing well or get worse. Document Released: 08/04/2007 Document Revised: 05/10/2011 Document Reviewed: 12/02/2010 [facility] Patient Information 2015 [place], [place]. This information is not intended to replace advice given to you by your health care provider. Make sure you discuss any questions you have with your health care provider.

## 2014-09-08 NOTE — ED Notes (Signed)
Dr. Jeanell Sparrow at [facility].

## 2015-10-22 ENCOUNTER — Other Ambulatory Visit (HOSPITAL_COMMUNITY)
Admission: RE | Admit: 2015-10-22 | Discharge: 2015-10-22 | Disposition: A | Discharge status: Home / Self Care | Payer: Managed Care, Other (non HMO) | Source: Ambulatory Visit | Attending: Internal Medicine | Admitting: Internal Medicine

## 2015-10-22 ENCOUNTER — Other Ambulatory Visit: Payer: Self-pay | Admitting: Internal Medicine

## 2015-10-22 DIAGNOSIS — Z01419 Encounter for gynecological examination (general) (routine) without abnormal findings: Secondary | ICD-10-CM | POA: Diagnosis present

## 2015-10-22 DIAGNOSIS — Z1151 Encounter for screening for human papillomavirus (HPV): Secondary | ICD-10-CM | POA: Insufficient documentation

## 2015-10-24 LAB — CYTOLOGY - PAP

## 2016-08-27 ENCOUNTER — Other Ambulatory Visit: Payer: Self-pay | Admitting: Internal Medicine

## 2016-08-27 DIAGNOSIS — Z1231 Encounter for screening mammogram for malignant neoplasm of breast: Secondary | ICD-10-CM

## 2016-09-02 ENCOUNTER — Ambulatory Visit
Admission: RE | Admit: 2016-09-02 | Discharge: 2016-09-02 | Disposition: A | Discharge status: Home / Self Care | Payer: Commercial Managed Care - PPO | Source: Ambulatory Visit | Attending: Internal Medicine | Admitting: Internal Medicine

## 2016-09-02 DIAGNOSIS — Z1231 Encounter for screening mammogram for malignant neoplasm of breast: Secondary | ICD-10-CM

## 2016-09-06 ENCOUNTER — Other Ambulatory Visit: Payer: Self-pay | Admitting: Internal Medicine

## 2016-09-06 DIAGNOSIS — R928 Other abnormal and inconclusive findings on diagnostic imaging of breast: Secondary | ICD-10-CM

## 2016-09-07 ENCOUNTER — Other Ambulatory Visit: Payer: Self-pay | Admitting: Internal Medicine

## 2016-09-07 DIAGNOSIS — R928 Other abnormal and inconclusive findings on diagnostic imaging of breast: Secondary | ICD-10-CM

## 2016-09-08 ENCOUNTER — Other Ambulatory Visit: Payer: Commercial Managed Care - PPO

## 2016-09-08 ENCOUNTER — Ambulatory Visit
Admission: RE | Admit: 2016-09-08 | Discharge: 2016-09-08 | Disposition: A | Discharge status: Home / Self Care | Payer: Commercial Managed Care - PPO | Source: Ambulatory Visit | Attending: Internal Medicine | Admitting: Internal Medicine

## 2016-09-08 DIAGNOSIS — R928 Other abnormal and inconclusive findings on diagnostic imaging of breast: Secondary | ICD-10-CM

## 2016-09-14 ENCOUNTER — Other Ambulatory Visit: Payer: Self-pay | Admitting: Internal Medicine

## 2016-09-22 ENCOUNTER — Ambulatory Visit (INDEPENDENT_AMBULATORY_CARE_PROVIDER_SITE_OTHER): Payer: Commercial Managed Care - PPO | Admitting: Primary Care

## 2016-09-22 ENCOUNTER — Ambulatory Visit (INDEPENDENT_AMBULATORY_CARE_PROVIDER_SITE_OTHER)
Admission: RE | Admit: 2016-09-22 | Discharge: 2016-09-22 | Disposition: A | Discharge status: Home / Self Care | Payer: Commercial Managed Care - PPO | Source: Ambulatory Visit | Attending: Primary Care | Admitting: Primary Care

## 2016-09-22 ENCOUNTER — Encounter: Payer: Self-pay | Admitting: Primary Care

## 2016-09-22 VITALS — BP 120/78 | HR 72 | Temp 98.0°F | Ht 62.5 in | Wt 187.8 lb

## 2016-09-22 DIAGNOSIS — G43709 Chronic migraine without aura, not intractable, without status migrainosus: Secondary | ICD-10-CM

## 2016-09-22 DIAGNOSIS — G43909 Migraine, unspecified, not intractable, without status migrainosus: Secondary | ICD-10-CM | POA: Insufficient documentation

## 2016-09-22 DIAGNOSIS — Z0001 Encounter for general adult medical examination with abnormal findings: Secondary | ICD-10-CM | POA: Insufficient documentation

## 2016-09-22 DIAGNOSIS — Z0000 Encounter for general adult medical examination without abnormal findings: Secondary | ICD-10-CM

## 2016-09-22 DIAGNOSIS — G8929 Other chronic pain: Secondary | ICD-10-CM | POA: Diagnosis not present

## 2016-09-22 DIAGNOSIS — M542 Cervicalgia: Secondary | ICD-10-CM

## 2016-09-22 DIAGNOSIS — F419 Anxiety disorder, unspecified: Secondary | ICD-10-CM | POA: Diagnosis not present

## 2016-09-22 DIAGNOSIS — F329 Major depressive disorder, single episode, unspecified: Secondary | ICD-10-CM | POA: Diagnosis not present

## 2016-09-22 DIAGNOSIS — F32A Depression, unspecified: Secondary | ICD-10-CM

## 2016-09-22 DIAGNOSIS — IMO0002 Reserved for concepts with insufficient information to code with codable children: Secondary | ICD-10-CM

## 2016-09-22 MED ORDER — SUMATRIPTAN SUCCINATE 50 MG PO TABS: ORAL_TABLET | ORAL | 3 refills | Status: DC

## 2016-09-22 MED ORDER — DULOXETINE HCL 30 MG PO CPEP: 30.0000 mg | ORAL_CAPSULE | Freq: Every day | ORAL | 3 refills | Status: DC

## 2016-09-22 NOTE — Assessment & Plan Note (Signed)
Well managed on Cymbalta, refills provided today.

## 2016-09-22 NOTE — Patient Instructions (Addendum)
Complete lab work and xray prior to leaving today.   I sent refills of your Cymbalta and sumatriptan to your pharmacy.  It's important to improve your diet by reducing consumption of fast food, fried food, processed snack foods, sugary drinks. Increase consumption of fresh vegetables and fruits, whole grains, water.  Ensure you are drinking 64 ounces of water daily.  Start exercising. You should be getting 150 minutes of moderate intensity exercise weekly.  We will be in touch once we receive your lab results.  It was a pleasure to meet you today! Please don't hesitate to call me with any questions. Welcome to [facility]!

## 2016-09-22 NOTE — Assessment & Plan Note (Signed)
Immunizations UTD. Pap and mammogram UTD. Discussed the importance of a healthy diet and regular exercise in order for weight loss, and to reduce the risk of other medical problems. Exam overall unremarkable. Labs pending. Follow up in 1 year for annual exam.

## 2016-09-22 NOTE — Assessment & Plan Note (Signed)
Chronic. Muscle tension noted to left posterior neck. No spinal tenderness. Will check xray today to rule out abnormality of hardware. Recommended she follow up with her surgeon.

## 2016-09-22 NOTE — Assessment & Plan Note (Signed)
Using sumatriptan 3-4 times monthly, excedrin migraine weekly. Has had increased stress with family issues. Neuro exam today unremarkable.

## 2016-09-22 NOTE — Progress Notes (Signed)
Subjective:    Patient ID: Brittany Pham, female    DOB: 1966/01/19, 51 y.o.   MRN: [id number]  HPI  Brittany Pham is a 51 year old female who presents today to establish care, for complete physical, and discuss the problems mentioned below. Will obtain old records.  1) Migraines: Currently managed on Excedrin migraines and Sumatriptan. She takes the Sumatriptan 3-4 times monthly on average, sometimes takes the second dose. She has weekly headaches and will use the Excedrin migraine.   2) Anxiety and Depression: Currently managed on Cymbalta 30 mg for which she's taken for years. Overall feels well managed. Denies SI/HI.   Immunizations: -Tetanus: Completed in 2016 -Influenza: Did complete last season.   Diet: She endorses a poor diet. Breakfast: Skips Lunch: Skips mostly, crackers Dinner: Meat, vegetable, starch, cereal  Snacks: Protein bar Desserts: Daily, ice cream Beverages: Coffee, Coke-working on cutting back, some water.  Exercise: She is not exercising.  Eye exam: Completed in May 2018 Dental exam: Completes semi-annually Pap Smear: Completed in 2017, negative Mammogram: Completed in 2018, ultrasound completed. Will repeat in 6 months.   Review of Systems  Constitutional: Negative for unexpected weight change.  HENT: Negative for rhinorrhea.   Respiratory: Negative for cough and shortness of breath.   Cardiovascular: Negative for chest pain.  Gastrointestinal: Negative for constipation and diarrhea.  Genitourinary: Negative for difficulty urinating and menstrual problem.       Hot flashes  Musculoskeletal: Positive for arthralgias, myalgias and neck pain.       History of cervical neck surgery with plates and screws. Chronic neck pain since. Muscle tension.  Skin: Negative for rash.  Allergic/Immunologic: Negative for environmental allergies.  Neurological: Negative for dizziness, numbness and headaches.  Psychiatric/Behavioral:       Doing well on Cymbalta        Past Medical History:  Diagnosis Date  . Arthritis   . Chickenpox   . Depression   . Migraines      Social History   Social History  . Marital status: Married    Spouse name: N/A  . Number of children: N/A  . Years of education: N/A   Occupational History  . Not on file.   Social History Main Topics  . Smoking status: Current Every Day Smoker  . Smokeless tobacco: Never Used  . Alcohol use No  . Drug use: No  . Sexual activity: Not on file   Other Topics Concern  . Not on file   Social History Narrative   Married.   2 children, 4 step children, 1 grandchild.    Works in [employer].    Enjoys swimming, spending time with family.     Past Surgical History:  Procedure Laterality Date  . CERVICAL ABLATION  2007  . CERVICAL SPINE SURGERY  2014    Family History  Problem Relation Age of Onset  . Arthritis Mother   . Breast cancer Mother   . Heart disease Father   . Arthritis Maternal Grandfather   . Lung cancer Maternal Grandfather     Allergies  Allergen Reactions  . Erythromycin Nausea And Vomiting  . Prednisone Other (See Comments)    Passes out     No current outpatient prescriptions on file prior to visit.   No current facility-administered medications on file prior to visit.     BP 120/78   Pulse 72   Temp 98 F (36.7 C) (Oral)   Ht 5' 2.5" (1.588 m)   Wt  187 lb 12.8 oz (85.2 kg)   BMI 33.80 kg/m    Objective:   Physical Exam  Constitutional: She is oriented to person, place, and time. She appears well-nourished.  HENT:  Right Ear: Tympanic membrane and ear canal normal.  Left Ear: Tympanic membrane and ear canal normal.  Nose: Nose normal.  Mouth/Throat: Oropharynx is clear and moist.  Eyes: Pupils are equal, round, and reactive to light. Conjunctivae and EOM are normal.  Neck: Neck supple. No thyromegaly present.    Tenderness to left posterior neck. No spinal tenderness.  Cardiovascular: Normal rate and regular rhythm.    No murmur heard. Pulmonary/Chest: Effort normal and breath sounds normal. She has no rales.  Abdominal: Soft. Bowel sounds are normal. There is no tenderness.  Musculoskeletal: Normal range of motion.  Lymphadenopathy:    She has no cervical adenopathy.  Neurological: She is alert and oriented to person, place, and time. She has normal reflexes. No cranial nerve deficit.  Skin: Skin is warm and dry.  Psychiatric: She has a normal mood and affect.          Assessment & Plan:

## 2016-09-23 LAB — COMPREHENSIVE METABOLIC PANEL
ALBUMIN: 4.4 g/dL (ref 3.5–5.2)
ALK PHOS: 43 U/L (ref 39–117)
ALT: 17 U/L (ref 0–35)
AST: 14 U/L (ref 0–37)
BUN: 10 mg/dL (ref 6–23)
CALCIUM: 9.4 mg/dL (ref 8.4–10.5)
CO2: 26 mEq/L (ref 19–32)
Chloride: 105 mEq/L (ref 96–112)
Creatinine, Ser: 0.99 mg/dL (ref 0.40–1.20)
GFR: 63.28 mL/min (ref >60.00)
GLUCOSE: 82 mg/dL (ref 70–99)
POTASSIUM: 4.2 meq/L (ref 3.5–5.1)
Sodium: 137 mEq/L (ref 135–145)
TOTAL PROTEIN: 7.1 g/dL (ref 6.0–8.3)
Total Bilirubin: 0.3 mg/dL (ref 0.2–1.2)

## 2016-09-23 LAB — LIPID PANEL
CHOLESTEROL: 194 mg/dL (ref 0–200)
HDL: 46.3 mg/dL (ref >39.00)
LDL Cholesterol: 127 mg/dL — ABNORMAL HIGH (ref 0–99)
NonHDL: 148.01
TRIGLYCERIDES: 106 mg/dL (ref 0.0–149.0)
Total CHOL/HDL Ratio: 4
VLDL: 21.2 mg/dL (ref 0.0–40.0)

## 2016-09-23 LAB — HEMOGLOBIN A1C: Hgb A1c MFr Bld: 6 % (ref 4.6–6.5)

## 2016-09-24 ENCOUNTER — Encounter: Payer: Self-pay | Admitting: *Deleted

## 2016-09-27 ENCOUNTER — Encounter: Payer: Self-pay | Admitting: *Deleted

## 2017-01-07 ENCOUNTER — Ambulatory Visit: Payer: Self-pay

## 2017-01-07 NOTE — Telephone Encounter (Signed)
I spoke with Mr Brittany Pham and pt is dressing to go to ED now.FYI to Avie Echevaria NP.

## 2017-01-07 NOTE — Telephone Encounter (Signed)
Pt is wheezing, has central chest pain and cough and congestion has lasted >2 weeks.  Reason for Disposition . Chest pain  (Exception: MILD central chest pain, present only when coughing)  Answer Assessment - Initial Assessment Questions 1. ONSET: "When did the cough begin?"      2 weeks ago 2. SEVERITY: "How bad is the cough today?"      Severe cough with wheezing 3. RESPIRATORY DISTRESS: "Describe your breathing."      Husband thinks she is breathing faster wheezing 4. FEVER: "Do you have a fever?" If so, ask: "What is your temperature, how was it measured, and when did it start?"     No buts feels clammy 5. SPUTUM: "Describe the color of your sputum" (clear, white, yellow, green)     yellow 6. HEMOPTYSIS: "Are you coughing up any blood?" If so ask: "How much?" (flecks, streaks, tablespoons, etc.)     no 7. CARDIAC HISTORY: "Do you have any history of heart disease?" (e.g., heart attack, congestive heart failure)      no 8. LUNG HISTORY: "Do you have any history of lung disease?"  (e.g., pulmonary embolus, asthma, emphysema)     No  9. PE RISK FACTORS: "Do you have a history of blood clots?" (or: recent major surgery, recent prolonged travel, bedridden )     no 10. OTHER SYMPTOMS: "Do you have any other symptoms?" (e.g., runny nose, wheezing, chest pain)       Runny nose wheezing chest pain with cough and without coughing 11. PREGNANCY: "Is there any chance you are pregnant?" "When was your last menstrual period?"       No N/a 12. TRAVEL: "Have you traveled out of the country in the last month?" (e.g., travel history, exposures)       no  Protocols used: [place]

## 2017-07-04 DIAGNOSIS — M5414 Radiculopathy, thoracic region: Secondary | ICD-10-CM | POA: Diagnosis not present

## 2017-07-04 DIAGNOSIS — M791 Myalgia, unspecified site: Secondary | ICD-10-CM | POA: Diagnosis not present

## 2017-07-08 DIAGNOSIS — M791 Myalgia, unspecified site: Secondary | ICD-10-CM | POA: Diagnosis not present

## 2017-07-08 DIAGNOSIS — M5414 Radiculopathy, thoracic region: Secondary | ICD-10-CM | POA: Diagnosis not present

## 2017-07-14 ENCOUNTER — Other Ambulatory Visit: Payer: Self-pay | Admitting: Primary Care

## 2017-07-14 DIAGNOSIS — IMO0002 Reserved for concepts with insufficient information to code with codable children: Secondary | ICD-10-CM

## 2017-07-14 DIAGNOSIS — G43709 Chronic migraine without aura, not intractable, without status migrainosus: Secondary | ICD-10-CM

## 2017-07-18 ENCOUNTER — Other Ambulatory Visit: Payer: Self-pay | Admitting: Primary Care

## 2017-07-18 DIAGNOSIS — M5414 Radiculopathy, thoracic region: Secondary | ICD-10-CM | POA: Diagnosis not present

## 2017-07-18 DIAGNOSIS — N6310 Unspecified lump in the right breast, unspecified quadrant: Secondary | ICD-10-CM

## 2017-07-18 DIAGNOSIS — M791 Myalgia, unspecified site: Secondary | ICD-10-CM | POA: Diagnosis not present

## 2017-07-27 DIAGNOSIS — M791 Myalgia, unspecified site: Secondary | ICD-10-CM | POA: Diagnosis not present

## 2017-07-27 DIAGNOSIS — M5414 Radiculopathy, thoracic region: Secondary | ICD-10-CM | POA: Diagnosis not present

## 2017-08-01 DIAGNOSIS — M791 Myalgia, unspecified site: Secondary | ICD-10-CM | POA: Diagnosis not present

## 2017-08-01 DIAGNOSIS — M5414 Radiculopathy, thoracic region: Secondary | ICD-10-CM | POA: Diagnosis not present

## 2017-08-12 DIAGNOSIS — M791 Myalgia, unspecified site: Secondary | ICD-10-CM | POA: Diagnosis not present

## 2017-08-12 DIAGNOSIS — M5414 Radiculopathy, thoracic region: Secondary | ICD-10-CM | POA: Diagnosis not present

## 2017-08-19 DIAGNOSIS — M5414 Radiculopathy, thoracic region: Secondary | ICD-10-CM | POA: Diagnosis not present

## 2017-08-19 DIAGNOSIS — M791 Myalgia, unspecified site: Secondary | ICD-10-CM | POA: Diagnosis not present

## 2017-08-27 ENCOUNTER — Other Ambulatory Visit: Payer: Self-pay | Admitting: Primary Care

## 2017-08-27 DIAGNOSIS — IMO0002 Reserved for concepts with insufficient information to code with codable children: Secondary | ICD-10-CM

## 2017-08-27 DIAGNOSIS — G43709 Chronic migraine without aura, not intractable, without status migrainosus: Secondary | ICD-10-CM

## 2017-08-29 DIAGNOSIS — M791 Myalgia, unspecified site: Secondary | ICD-10-CM | POA: Diagnosis not present

## 2017-08-29 DIAGNOSIS — M5414 Radiculopathy, thoracic region: Secondary | ICD-10-CM | POA: Diagnosis not present

## 2017-08-29 NOTE — Telephone Encounter (Signed)
Refilled times one in pcp absence  

## 2017-08-29 NOTE — Telephone Encounter (Signed)
[facility] pt, she is out of the office... Rx last filled 07/15/2017 #10... Please advise

## 2017-09-05 DIAGNOSIS — M5414 Radiculopathy, thoracic region: Secondary | ICD-10-CM | POA: Diagnosis not present

## 2017-09-05 DIAGNOSIS — M791 Myalgia, unspecified site: Secondary | ICD-10-CM | POA: Diagnosis not present

## 2017-09-06 ENCOUNTER — Ambulatory Visit
Admission: RE | Admit: 2017-09-06 | Discharge: 2017-09-06 | Disposition: A | Discharge status: Home / Self Care | Payer: BLUE CROSS/BLUE SHIELD | Source: Ambulatory Visit | Attending: Primary Care | Admitting: Primary Care

## 2017-09-06 DIAGNOSIS — N6310 Unspecified lump in the right breast, unspecified quadrant: Secondary | ICD-10-CM

## 2017-09-06 DIAGNOSIS — R928 Other abnormal and inconclusive findings on diagnostic imaging of breast: Secondary | ICD-10-CM | POA: Diagnosis not present

## 2017-09-19 ENCOUNTER — Ambulatory Visit (INDEPENDENT_AMBULATORY_CARE_PROVIDER_SITE_OTHER): Payer: BLUE CROSS/BLUE SHIELD | Admitting: Primary Care

## 2017-09-19 ENCOUNTER — Encounter: Payer: Commercial Managed Care - PPO | Admitting: Primary Care

## 2017-09-19 ENCOUNTER — Other Ambulatory Visit: Payer: Self-pay | Admitting: Primary Care

## 2017-09-19 ENCOUNTER — Encounter: Payer: Self-pay | Admitting: Primary Care

## 2017-09-19 VITALS — BP 118/78 | HR 82 | Temp 98.1°F | Ht 62.5 in | Wt 202.5 lb

## 2017-09-19 DIAGNOSIS — R7303 Prediabetes: Secondary | ICD-10-CM

## 2017-09-19 DIAGNOSIS — IMO0002 Reserved for concepts with insufficient information to code with codable children: Secondary | ICD-10-CM

## 2017-09-19 DIAGNOSIS — F419 Anxiety disorder, unspecified: Secondary | ICD-10-CM

## 2017-09-19 DIAGNOSIS — M791 Myalgia, unspecified site: Secondary | ICD-10-CM | POA: Diagnosis not present

## 2017-09-19 DIAGNOSIS — Z1211 Encounter for screening for malignant neoplasm of colon: Secondary | ICD-10-CM

## 2017-09-19 DIAGNOSIS — F329 Major depressive disorder, single episode, unspecified: Secondary | ICD-10-CM

## 2017-09-19 DIAGNOSIS — M5414 Radiculopathy, thoracic region: Secondary | ICD-10-CM | POA: Diagnosis not present

## 2017-09-19 DIAGNOSIS — M542 Cervicalgia: Secondary | ICD-10-CM

## 2017-09-19 DIAGNOSIS — G43709 Chronic migraine without aura, not intractable, without status migrainosus: Secondary | ICD-10-CM | POA: Diagnosis not present

## 2017-09-19 DIAGNOSIS — R519 Headache, unspecified: Secondary | ICD-10-CM | POA: Insufficient documentation

## 2017-09-19 DIAGNOSIS — Z0000 Encounter for general adult medical examination without abnormal findings: Secondary | ICD-10-CM | POA: Diagnosis not present

## 2017-09-19 DIAGNOSIS — F32A Depression, unspecified: Secondary | ICD-10-CM

## 2017-09-19 DIAGNOSIS — G8929 Other chronic pain: Secondary | ICD-10-CM

## 2017-09-19 DIAGNOSIS — R51 Headache: Secondary | ICD-10-CM

## 2017-09-19 DIAGNOSIS — A6000 Herpesviral infection of urogenital system, unspecified: Secondary | ICD-10-CM

## 2017-09-19 DIAGNOSIS — N393 Stress incontinence (female) (male): Secondary | ICD-10-CM

## 2017-09-19 DIAGNOSIS — N951 Menopausal and female climacteric states: Secondary | ICD-10-CM | POA: Insufficient documentation

## 2017-09-19 LAB — COMPREHENSIVE METABOLIC PANEL
ALT: 22 U/L (ref 0–35)
AST: 13 U/L (ref 0–37)
Albumin: 4.3 g/dL (ref 3.5–5.2)
Alkaline Phosphatase: 50 U/L (ref 39–117)
BUN: 13 mg/dL (ref 6–23)
CO2: 29 mEq/L (ref 19–32)
Calcium: 9.3 mg/dL (ref 8.4–10.5)
Chloride: 103 mEq/L (ref 96–112)
Creatinine, Ser: 0.84 mg/dL (ref 0.40–1.20)
GFR: 76.18 mL/min (ref >60.00)
GLUCOSE: 106 mg/dL — AB (ref 70–99)
POTASSIUM: 4.2 meq/L (ref 3.5–5.1)
SODIUM: 139 meq/L (ref 135–145)
Total Bilirubin: 0.4 mg/dL (ref 0.2–1.2)
Total Protein: 7.2 g/dL (ref 6.0–8.3)

## 2017-09-19 LAB — TSH: TSH: 1.01 u[IU]/mL (ref 0.35–4.50)

## 2017-09-19 LAB — LIPID PANEL
CHOL/HDL RATIO: 5
Cholesterol: 202 mg/dL — ABNORMAL HIGH (ref 0–200)
HDL: 43.5 mg/dL (ref >39.00)
LDL CALC: 136 mg/dL — AB (ref 0–99)
NONHDL: 158.29
Triglycerides: 113 mg/dL (ref 0.0–149.0)
VLDL: 22.6 mg/dL (ref 0.0–40.0)

## 2017-09-19 LAB — HEMOGLOBIN A1C: HEMOGLOBIN A1C: 6.2 % (ref 4.6–6.5)

## 2017-09-19 MED ORDER — ACYCLOVIR 400 MG PO TABS
ORAL_TABLET | ORAL | 0 refills | Status: DC
Start: 2017-09-19 — End: 2018-01-21

## 2017-09-19 MED ORDER — TOPIRAMATE 50 MG PO TABS: ORAL_TABLET | ORAL | 1 refills | Status: DC

## 2017-09-19 MED ORDER — DULOXETINE HCL 30 MG PO CPEP: 30.0000 mg | ORAL_CAPSULE | Freq: Every day | ORAL | 3 refills | Status: DC

## 2017-09-19 NOTE — Assessment & Plan Note (Addendum)
Triggered by chronic neck pain. Located to left occipatal and behind left eye. Occurring 3-4 times monthly, using Sumatriptan once weekly. Daily headaches. Has never been on preventative treatment for headaches.    Rx for Topamax 50 mg tablets for preventative treatment provided today. Discussed potential side effects and directions for administration. She will update in a few weeks.

## 2017-09-19 NOTE — Assessment & Plan Note (Addendum)
Typical outbreaks occur every 6-8 months on average. Using acyclovir PRN. Refill sent to pharmacy.

## 2017-09-19 NOTE — Assessment & Plan Note (Signed)
History of bladder tacking several years ago, does experience breakthrough incontinence. Referral to GYN to discuss pessary/other treatment.

## 2017-09-19 NOTE — Assessment & Plan Note (Signed)
Hot flashes and decreased libido likely secondary to perimenopause. Discussed triggers of hot flashes and avoidance. She will discuss with GYN during her upcoming appointment.

## 2017-09-19 NOTE — Assessment & Plan Note (Signed)
Daily headaches with migraines once weekly. Discussed treatment options and will start with Topamax 50 mg once daily. Rx sent to pharmacy, she will update in a few weeks.

## 2017-09-19 NOTE — Assessment & Plan Note (Addendum)
Working with chiropractor, using Ibuprofen as needed and Tumeric. Triggering headaches.

## 2017-09-19 NOTE — Assessment & Plan Note (Signed)
Overall doing well on Cymbalta despite increased stress with personal life. Continue same.

## 2017-09-19 NOTE — Patient Instructions (Addendum)
Stop by the lab prior to leaving today. I will notify you of your results once received.   You will be contacted regarding your referral to GI for the colonoscopy and also Gynecology for urinary incontinence and hot flashes.  Please let us know if you have not been contacted within one week.   Work on [employer] as discussed. Limit soda, pasta, take out food. Increase vegetables, fruit, whole grains, lean protein.  Ensure you are consuming 64 ounces of water daily.  Start exercising. You should be getting 150 minutes of moderate intensity exercise weekly.  Start topiramate 50 mg tablets for migraines and headache prevention. Take 1 tablet by mouth every night at bedtime. Please update me in a few weeks.  Follow up in 1 year for your annual exam or sooner if needed.  It was a pleasure to see you today!   Preventive Care 40-64 Years, Female Preventive care refers to lifestyle choices and visits with your health care provider that can promote health and wellness. What does preventive care include?  A yearly physical exam. This is also called an annual well check.  Dental exams once or twice a year.  Routine eye exams. Ask your health care provider how often you should have your eyes checked.  Personal lifestyle choices, including: ? Daily care of your teeth and gums. ? Regular physical activity. ? Eating a healthy diet. ? Avoiding tobacco and drug use. ? Limiting alcohol use. ? Practicing safe sex. ? Taking low-dose aspirin daily starting at age 21. ? Taking vitamin and mineral supplements as recommended by your health care provider. What happens during an annual well check? The services and screenings done by your health care provider during your annual well check will depend on your age, overall health, lifestyle risk factors, and family history of disease. Counseling Your health care provider may ask you questions about your:  Alcohol use.  Tobacco use.  Drug  use.  Emotional well-being.  Home and relationship well-being.  Sexual activity.  Eating habits.  Work and work Statistician.  Method of birth control.  Menstrual cycle.  Pregnancy history.  Screening You may have the following tests or measurements:  Height, weight, and BMI.  Blood pressure.  Lipid and cholesterol levels. These may be checked every 5 years, or more frequently if you are over 28 years old.  Skin check.  Lung cancer screening. You may have this screening every year starting at age 23 if you have a 30-pack-year history of smoking and currently smoke or have quit within the past 15 years.  Fecal occult blood test (FOBT) of the stool. You may have this test every year starting at age 23.  Flexible sigmoidoscopy or colonoscopy. You may have a sigmoidoscopy every 5 years or a colonoscopy every 10 years starting at age 53.  Hepatitis C blood test.  Hepatitis B blood test.  Sexually transmitted disease (STD) testing.  Diabetes screening. This is done by checking your blood sugar (glucose) after you have not eaten for a while (fasting). You may have this done every 1-3 years.  Mammogram. This may be done every 1-2 years. Talk to your health care provider about when you should start having regular mammograms. This may depend on whether you have a family history of breast cancer.  BRCA-related cancer screening. This may be done if you have a family history of breast, ovarian, tubal, or peritoneal cancers.  Pelvic exam and Pap test. This may be done every 3 years starting  at age 34. Starting at age 84, this may be done every 5 years if you have a Pap test in combination with an HPV test.  Bone density scan. This is done to screen for osteoporosis. You may have this scan if you are at high risk for osteoporosis.  Discuss your test results, treatment options, and if necessary, the need for more tests with your health care provider. Vaccines Your health care  provider may recommend certain vaccines, such as:  Influenza vaccine. This is recommended every year.  Tetanus, diphtheria, and acellular pertussis (Tdap, Td) vaccine. You may need a Td booster every 10 years.  Varicella vaccine. You may need this if you have not been vaccinated.  Zoster vaccine. You may need this after age 45.  Measles, mumps, and rubella (MMR) vaccine. You may need at least one dose of MMR if you were born in 1957 or later. You may also need a second dose.  Pneumococcal 13-valent conjugate (PCV13) vaccine. You may need this if you have certain conditions and were not previously vaccinated.  Pneumococcal polysaccharide (PPSV23) vaccine. You may need one or two doses if you smoke cigarettes or if you have certain conditions.  Meningococcal vaccine. You may need this if you have certain conditions.  Hepatitis A vaccine. You may need this if you have certain conditions or if you travel or work in places where you may be exposed to hepatitis A.  Hepatitis B vaccine. You may need this if you have certain conditions or if you travel or work in places where you may be exposed to hepatitis B.  Haemophilus influenzae type b (Hib) vaccine. You may need this if you have certain conditions.  Talk to your health care provider about which screenings and vaccines you need and how often you need them. This information is not intended to replace advice given to you by your health care provider. Make sure you discuss any questions you have with your health care provider. Document Released: 03/14/2015 Document Revised: 11/05/2015 Document Reviewed: 12/17/2014 Elsevier Interactive Patient Education  [employer].

## 2017-09-19 NOTE — Assessment & Plan Note (Signed)
Repeat A1C pending. Discussed dietary changes and the importance of regular exercise.

## 2017-09-19 NOTE — Assessment & Plan Note (Signed)
Immunizations UTD. Pap smear UTD. Mammogram due, orders placed. Colonoscopy due, referral placed to GI. Discussed the importance of a healthy diet and regular exercise in order for weight loss, and to reduce the risk of any potential medical problems. Exam unremarkable. Labs pending. Follow up in 1 year for CPE.

## 2017-09-19 NOTE — Progress Notes (Signed)
Subjective:    Patient ID: Brittany Pham, female    DOB: [birth year], 52 y.o.   MRN: [id number]  HPI  Brittany Pham is a 52 year old female who presents today for complete physical.  She has noticed symptoms of menopause including decreased sexual interest, night sweats. She underwent uterine ablation years ago, thinks she might be near menopause. She is using [employer] OTC now, she does not follow with GYN.   Has undergone bladder tacking procedure 8-10 years ago. She experiences urinary leakage with cough, laugh and is interested in further treatment with pessary.   Immunizations: -Tetanus: Completed  -Influenza: Did not complete last season   Diet: She endorses a fair diet. She endorses weight gain over time. She changed her diet several months ago.  Breakfast: Skips Lunch: Take out (salads), Lean Cuisine  Dinner: Pasta, vegetables, steak, chicken Snacks: Occasionally, cheese crackers Desserts: Once weekly Beverages: Coffee with milk, soda, water, occasional sweet tea  Exercise: She is not exercise due to chronic neck pain.  Eye exam: Due. Dental exam: Completes annually  Colonoscopy: Completed in her early 60's.  Pap Smear: Completed in 2017.  Mammogram: Completed in July 2017.  Wt Readings from Last 3 Encounters:  09/19/17 202 lb 8 oz (91.9 kg)  09/22/16 187 lb 12.8 oz (85.2 kg)   BP Readings from Last 3 Encounters:  09/19/17 118/78  09/22/16 120/78  09/08/14 102/76     Review of Systems  Constitutional: Negative for unexpected weight change.  HENT: Negative for rhinorrhea.   Respiratory: Negative for cough and shortness of breath.   Cardiovascular: Negative for chest pain.  Gastrointestinal: Negative for constipation and diarrhea.  Genitourinary:       Urinary stress incontinence. Hot flashes. Decrease sexual drive.  Musculoskeletal: Negative for arthralgias and myalgias.  Skin: Negative for rash.       Dry, irritation to palmer fingers. Itchy. She's  using benadryl liquid without improvement.   Allergic/Immunologic: Negative for environmental allergies.  Neurological: Positive for headaches. Negative for dizziness and numbness.  Psychiatric/Behavioral: The patient is not nervous/anxious.        Past Medical History:  Diagnosis Date  . Arthritis   . Chickenpox   . Depression   . Migraines      Social History   Socioeconomic History  . Marital status: Married    Spouse name: Not on file  . Number of children: Not on file  . Years of education: Not on file  . Highest education level: Not on file  Occupational History  . Not on file  Social Needs  . Financial resource strain: Not on file  . Food insecurity:    Worry: Not on file    Inability: Not on file  . Transportation needs:    Medical: Not on file    Non-medical: Not on file  Tobacco Use  . Smoking status: Light Tobacco Smoker  . Smokeless tobacco: Never Used  Substance and Sexual Activity  . Alcohol use: No  . Drug use: No  . Sexual activity: Not on file  Lifestyle  . Physical activity:    Days per week: Not on file    Minutes per session: Not on file  . Stress: Not on file  Relationships  . Social connections:    Talks on phone: Not on file    Gets together: Not on file    Attends religious service: Not on file    Active member of club or organization: Not on  file    Attends meetings of clubs or organizations: Not on file    Relationship status: Not on file  . Intimate partner violence:    Fear of current or ex partner: Not on file    Emotionally abused: Not on file    Physically abused: Not on file    Forced sexual activity: Not on file  Other Topics Concern  . Not on file  Social History Narrative   Married.   2 children, 4 step children, 1 grandchild.    Works in [employer].    Enjoys swimming, spending time with family.     Past Surgical History:  Procedure Laterality Date  . CERVICAL ABLATION  2007  . CERVICAL SPINE SURGERY  2014     Family History  Problem Relation Age of Onset  . Arthritis Mother   . Breast cancer Mother 31  . Heart disease Father   . Arthritis Maternal Grandfather   . Lung cancer Maternal Grandfather     Allergies  Allergen Reactions  . Erythromycin Nausea And Vomiting  . Prednisone Other (See Comments)    Passes out     Current Outpatient Medications on File Prior to Visit  Medication Sig Dispense Refill  . aspirin-acetaminophen-caffeine (EXCEDRIN MIGRAINE) 250-250-65 MG tablet Take 1 tablet by mouth every 6 (six) hours as needed for headache or migraine.    . SUMAtriptan (IMITREX) 50 MG tablet TAKE 1 TAB BY MOUTH AT MIGRAINE ONSET. MAY REPEAT IN 2HRS IR HEADACHE PERSISTS OR RECURS. 10 tablet 0   No current facility-administered medications on file prior to visit.     BP 118/78   Pulse 82   Temp 98.1 F (36.7 C) (Oral)   Ht 5' 2.5" (1.588 m)   Wt 202 lb 8 oz (91.9 kg)   SpO2 98%   BMI 36.45 kg/m    Objective:   Physical Exam  Constitutional: She is oriented to person, place, and time. She appears well-nourished.  HENT:  Mouth/Throat: No oropharyngeal exudate.  Eyes: Pupils are equal, round, and reactive to light. EOM are normal.  Neck: Neck supple. No thyromegaly present.  Cardiovascular: Normal rate and regular rhythm.  Respiratory: Effort normal and breath sounds normal.  GI: Soft. Bowel sounds are normal. There is no tenderness.  Musculoskeletal: Normal range of motion.  Neurological: She is alert and oriented to person, place, and time.  Skin: Skin is warm and dry.  Psychiatric: She has a normal mood and affect.           Assessment & Plan:

## 2017-09-20 ENCOUNTER — Encounter: Payer: Self-pay | Admitting: *Deleted

## 2017-09-23 ENCOUNTER — Other Ambulatory Visit: Payer: Self-pay

## 2017-09-23 ENCOUNTER — Telehealth: Payer: Self-pay

## 2017-09-23 NOTE — Telephone Encounter (Signed)
LVM for pt to call back to provide instructions and date for colonoscopy.  Triaged completed by Tati.  Thanks [employer]

## 2017-09-24 ENCOUNTER — Encounter: Payer: Self-pay | Admitting: Gynecology

## 2017-09-24 ENCOUNTER — Ambulatory Visit
Admission: EM | Admit: 2017-09-24 | Discharge: 2017-09-24 | Disposition: A | Discharge status: Home / Self Care | Payer: BLUE CROSS/BLUE SHIELD | Attending: Family Medicine | Admitting: Family Medicine

## 2017-09-24 ENCOUNTER — Other Ambulatory Visit: Payer: Self-pay

## 2017-09-24 DIAGNOSIS — R05 Cough: Secondary | ICD-10-CM | POA: Diagnosis not present

## 2017-09-24 DIAGNOSIS — J069 Acute upper respiratory infection, unspecified: Secondary | ICD-10-CM | POA: Diagnosis not present

## 2017-09-24 DIAGNOSIS — B9789 Other viral agents as the cause of diseases classified elsewhere: Secondary | ICD-10-CM

## 2017-09-24 MED ORDER — HYDROCOD POLST-CPM POLST ER 10-8 MG/5ML PO SUER: 5.0000 mL | Freq: Every evening | ORAL | 0 refills | Status: DC | PRN

## 2017-09-24 NOTE — ED Triage Notes (Signed)
Patient c/o cough and chest congestion x 3 days

## 2017-09-24 NOTE — ED Provider Notes (Signed)
[facility]    CSN: [id number] Arrival date & time: 09/24/17  1225     History   Chief Complaint Chief Complaint  Patient presents with  . Cough    HPI ELLIONNA BUCKBEE is a 52 y.o. female.   The history is provided by the patient.  Cough  Associated symptoms: myalgias and rhinorrhea   Associated symptoms: no wheezing   URI  Presenting symptoms: congestion, cough and rhinorrhea   Severity:  Moderate Onset quality:  Sudden Duration:  3 days Timing:  Constant Progression:  Worsening Chronicity:  New Relieved by:  None tried Ineffective treatments:  None tried Associated symptoms: myalgias   Associated symptoms: no sinus pain and no wheezing   Risk factors: not elderly, no chronic cardiac disease, no chronic kidney disease, no chronic respiratory disease, no diabetes mellitus, no immunosuppression, no recent illness and no recent travel     Past Medical History:  Diagnosis Date  . Arthritis   . Chickenpox   . Depression   . Migraines     Patient Active Problem List   Diagnosis Date Noted  . Genital herpes 09/19/2017  . Frequent headaches 09/19/2017  . Stress incontinence of urine 09/19/2017  . Prediabetes 09/19/2017  . Menopausal symptoms 09/19/2017  . Chronic migraine 09/22/2016  . Chronic neck pain 09/22/2016  . Anxiety and depression 09/22/2016  . Preventative health care 09/22/2016    Past Surgical History:  Procedure Laterality Date  . CERVICAL ABLATION  2007  . CERVICAL SPINE SURGERY  2014    OB History   None      Home Medications    Prior to Admission medications   Medication Sig Start Date End Date Taking? Authorizing Provider  acyclovir (ZOVIRAX) 400 MG tablet Take 1 tablet three times daily for 5 days as needed for outbreaks. 09/19/17  Yes Pleas Koch, NP  aspirin-acetaminophen-caffeine (EXCEDRIN MIGRAINE) [phone] MG tablet Take 1 tablet by mouth every 6 (six) hours as needed for headache or migraine.   Yes  [provider]  DULoxetine (CYMBALTA) 30 MG capsule Take 1 capsule (30 mg total) by mouth daily. 09/19/17  Yes Pleas Koch, NP  SUMAtriptan (IMITREX) 50 MG tablet TAKE 1 TAB BY MOUTH AT MIGRAINE ONSET. MAY REPEAT IN 2HRS IR HEADACHE PERSISTS OR RECURS. 08/29/17  Yes Tower, Wynelle Fanny, MD  topiramate (TOPAMAX) 50 MG tablet Take 1 tablet by mouth at bedtime for headache prevention. 09/19/17  Yes Pleas Koch, NP  chlorpheniramine-HYDROcodone (TUSSIONEX PENNKINETIC ER) 10-8 MG/5ML SUER Take 5 mLs by mouth at bedtime as needed. 09/24/17   Norval Gable, MD    Family History Family History  Problem Relation Age of Onset  . Arthritis Mother   . Breast cancer Mother 26  . Heart disease Father   . Arthritis Maternal Grandfather   . Lung cancer Maternal Grandfather     Social History Social History   Tobacco Use  . Smoking status: Light Tobacco Smoker  . Smokeless tobacco: Never Used  Substance Use Topics  . Alcohol use: No  . Drug use: No     Allergies   Erythromycin and Prednisone   Review of Systems Review of Systems  HENT: Positive for congestion and rhinorrhea. Negative for sinus pain.   Respiratory: Positive for cough. Negative for wheezing.   Musculoskeletal: Positive for myalgias.     Physical Exam Triage Vital Signs ED Triage Vitals  Enc Vitals Group     BP 09/24/17 1245 125/85  Pulse Rate 09/24/17 1245 100     Resp 09/24/17 1245 16     Temp 09/24/17 1245 98 F (36.7 C)     Temp Source 09/24/17 1245 Oral     SpO2 09/24/17 1245 98 %     Weight 09/24/17 1246 202 lb (91.6 kg)     Height --      Head Circumference --      Peak Flow --      Pain Score 09/24/17 1246 3     Pain Loc --      Pain Edu? --      Excl. in [place]? --    No data found.  Updated Vital Signs BP 125/85 (BP Location: Left Arm)   Pulse 100   Temp 98 F (36.7 C) (Oral)   Resp 16   Wt 202 lb (91.6 kg)   SpO2 98%   BMI 36.36 kg/m   Visual Acuity Right Eye Distance:     Left Eye Distance:   Bilateral Distance:    Right Eye Near:   Left Eye Near:    Bilateral Near:     Physical Exam  Constitutional: She appears well-developed and well-nourished. No distress.  HENT:  Head: Normocephalic and atraumatic.  Right Ear: Tympanic membrane, external ear and ear canal normal.  Left Ear: Tympanic membrane, external ear and ear canal normal.  Nose: Rhinorrhea present. No mucosal edema, nose lacerations, sinus tenderness, nasal deformity, septal deviation or nasal septal hematoma. No epistaxis.  No foreign bodies. Right sinus exhibits no maxillary sinus tenderness and no frontal sinus tenderness. Left sinus exhibits no maxillary sinus tenderness and no frontal sinus tenderness.  Mouth/Throat: Uvula is midline, oropharynx is clear and moist and mucous membranes are normal. No oropharyngeal exudate.  Eyes: Right eye exhibits no discharge. Left eye exhibits no discharge. No scleral icterus.  Neck: Normal range of motion. Neck supple. No thyromegaly present.  Cardiovascular: Normal rate, regular rhythm and normal heart sounds.  Pulmonary/Chest: Effort normal and breath sounds normal. No stridor. No respiratory distress. She has no wheezes. She has no rales.  Lymphadenopathy:    She has no cervical adenopathy.  Skin: She is not diaphoretic.  Nursing note and vitals reviewed.    UC Treatments / Results  Labs (all labs ordered are listed, but only abnormal results are displayed) Labs Reviewed - No data to display  EKG None  Radiology No results found.  Procedures Procedures (including critical care time)  Medications Ordered in UC Medications - No data to display  Initial Impression / Assessment and Plan / UC Course  I have reviewed the triage vital signs and the nursing notes.  Pertinent labs & imaging results that were available during my care of the patient were reviewed by me and considered in my medical decision making (see chart for details).       Final Clinical Impressions(s) / UC Diagnoses   Final diagnoses:  Viral URI with cough   Discharge Instructions   None    ED Prescriptions    Medication Sig Dispense Auth. Provider   chlorpheniramine-HYDROcodone (TUSSIONEX PENNKINETIC ER) 10-8 MG/5ML SUER Take 5 mLs by mouth at bedtime as needed. 60 mL Norval Gable, MD     1. diagnosis reviewed with patient  2. rx as per orders above; reviewed possible side effects, interactions, risks and benefits  3. Recommend supportive treatment with rest, fluids 4. Follow-up prn if symptoms worsen or don't improve   Controlled Substance Prescriptions [place] Controlled Substance Registry  consulted? Not Applicable   Norval Gable, MD 09/24/17 1320

## 2017-09-26 ENCOUNTER — Other Ambulatory Visit: Payer: Self-pay

## 2017-09-26 DIAGNOSIS — M791 Myalgia, unspecified site: Secondary | ICD-10-CM | POA: Diagnosis not present

## 2017-09-26 DIAGNOSIS — M5414 Radiculopathy, thoracic region: Secondary | ICD-10-CM | POA: Diagnosis not present

## 2017-09-26 DIAGNOSIS — Z1211 Encounter for screening for malignant neoplasm of colon: Secondary | ICD-10-CM

## 2017-10-05 ENCOUNTER — Encounter: Admission: RE | Payer: Self-pay | Source: Ambulatory Visit

## 2017-10-05 ENCOUNTER — Ambulatory Visit
Admission: RE | Admit: 2017-10-05 | Payer: BLUE CROSS/BLUE SHIELD | Source: Ambulatory Visit | Admitting: Gastroenterology

## 2017-10-05 SURGERY — COLONOSCOPY WITH PROPOFOL
Anesthesia: General

## 2017-10-08 ENCOUNTER — Ambulatory Visit (INDEPENDENT_AMBULATORY_CARE_PROVIDER_SITE_OTHER): Payer: BLUE CROSS/BLUE SHIELD

## 2017-10-08 ENCOUNTER — Ambulatory Visit
Admission: EM | Admit: 2017-10-08 | Discharge: 2017-10-08 | Disposition: A | Discharge status: Home / Self Care | Payer: BLUE CROSS/BLUE SHIELD | Attending: Family Medicine | Admitting: Family Medicine

## 2017-10-08 ENCOUNTER — Other Ambulatory Visit: Payer: Self-pay

## 2017-10-08 DIAGNOSIS — R110 Nausea: Secondary | ICD-10-CM | POA: Diagnosis not present

## 2017-10-08 DIAGNOSIS — R197 Diarrhea, unspecified: Secondary | ICD-10-CM

## 2017-10-08 DIAGNOSIS — R140 Abdominal distension (gaseous): Secondary | ICD-10-CM

## 2017-10-08 LAB — CBC WITH DIFFERENTIAL/PLATELET
BASOS ABS: 0.1 10*3/uL (ref 0–0.1)
BASOS PCT: 1 %
EOS ABS: 0.3 10*3/uL (ref 0–0.7)
EOS PCT: 3 %
HCT: 44.3 % (ref 35.0–47.0)
Hemoglobin: 15.2 g/dL (ref 12.0–16.0)
Lymphocytes Relative: 27 %
Lymphs Abs: 2.4 10*3/uL (ref 1.0–3.6)
MCH: 30.9 pg (ref 26.0–34.0)
MCHC: 34.4 g/dL (ref 32.0–36.0)
MCV: 90 fL (ref 80.0–100.0)
MONO ABS: 0.6 10*3/uL (ref 0.2–0.9)
Monocytes Relative: 7 %
Neutro Abs: 5.5 10*3/uL (ref 1.4–6.5)
Neutrophils Relative %: 62 %
PLATELETS: 403 10*3/uL (ref 150–440)
RBC: 4.92 MIL/uL (ref 3.80–5.20)
RDW: 13.8 % (ref 11.5–14.5)
WBC: 8.8 10*3/uL (ref 3.6–11.0)

## 2017-10-08 LAB — BASIC METABOLIC PANEL
Anion gap: 10 (ref 5–15)
BUN: 13 mg/dL (ref 6–20)
CALCIUM: 9 mg/dL (ref 8.9–10.3)
CO2: 20 mmol/L — ABNORMAL LOW (ref 22–32)
Chloride: 108 mmol/L (ref 98–111)
Creatinine, Ser: 0.83 mg/dL (ref 0.44–1.00)
GLUCOSE: 103 mg/dL — AB (ref 70–99)
Potassium: 4 mmol/L (ref 3.5–5.1)
SODIUM: 138 mmol/L (ref 135–145)

## 2017-10-08 LAB — LIPASE, BLOOD: LIPASE: 21 U/L (ref 11–51)

## 2017-10-08 MED ORDER — ONDANSETRON 8 MG PO TBDP
8.0000 mg | ORAL_TABLET | Freq: Once | ORAL | Status: AC
  Administered 2017-10-08: 8 mg via ORAL

## 2017-10-08 MED ORDER — ONDANSETRON 8 MG PO TBDP: 8.0000 mg | ORAL_TABLET | Freq: Three times a day (TID) | ORAL | 0 refills | Status: DC | PRN

## 2017-10-08 NOTE — ED Provider Notes (Addendum)
[facility] ____________________________________________  Time seen: Approximately 10:43 AM  I have reviewed the triage vital signs and the nursing notes.   HISTORY  Chief Complaint Nausea  HPI Brittany Pham is a 52 y.o. female presenting with husband at bedside for evaluation of nausea, diarrhea and gaseous feeling present since Tuesday.  States symptoms are very milder Tuesday and then worse around Wednesday.  States no vomiting but has been having watery diarrhea.  Denies blood in stool or abdominal pain.  States abdomen feels bloated.  States she is also passing gas a lot over the last few days.  Denies fevers, dysuria, atypical back pain, chest pain, shortness of breath, palpitations, dizziness, rash or fever.  States overall has had a decreased appetite, but has continued to drink fluids and eat small amounts of food.  Denies known trigger or undercooked meat.  States that she did start a new migraine medication on Sunday.  States that she took Topamax on Sunday and Monday but has not since taking any more as she did not know if this was a contributing factor.  Denies others in household with similar.  Reports otherwise feels well denies other complaints.Denies recent sickness. Denies recent antibiotic use.   Pleas Koch, NP: PCP    Past Medical History:  Diagnosis Date  . Arthritis   . Chickenpox   . Depression   . Migraines   Irritable bowel syndrome  Patient Active Problem List   Diagnosis Date Noted  . Genital herpes 09/19/2017  . Frequent headaches 09/19/2017  . Stress incontinence of urine 09/19/2017  . Prediabetes 09/19/2017  . Menopausal symptoms 09/19/2017  . Chronic migraine 09/22/2016  . Chronic neck pain 09/22/2016  . Anxiety and depression 09/22/2016  . Preventative health care 09/22/2016    Past Surgical History:  Procedure Laterality Date  . CERVICAL ABLATION  2007  . CERVICAL SPINE SURGERY  2014     No current  facility-administered medications for this encounter.   Current Outpatient Medications:  .  acyclovir (ZOVIRAX) 400 MG tablet, Take 1 tablet three times daily for 5 days as needed for outbreaks., Disp: 30 tablet, Rfl: 0 .  aspirin-acetaminophen-caffeine (EXCEDRIN MIGRAINE) 250-250-65 MG tablet, Take 1 tablet by mouth every 6 (six) hours as needed for headache or migraine., Disp: , Rfl:  .  DULoxetine (CYMBALTA) 30 MG capsule, Take 1 capsule (30 mg total) by mouth daily., Disp: 90 capsule, Rfl: 3 .  ondansetron (ZOFRAN ODT) 8 MG disintegrating tablet, Take 1 tablet (8 mg total) by mouth every 8 (eight) hours as needed for nausea or vomiting., Disp: 20 tablet, Rfl: 0 .  SUMAtriptan (IMITREX) 50 MG tablet, TAKE 1 TAB BY MOUTH AT MIGRAINE ONSET. MAY REPEAT IN 2HRS IR HEADACHE PERSISTS OR RECURS., Disp: 10 tablet, Rfl: 0 .  topiramate (TOPAMAX) 50 MG tablet, Take 1 tablet by mouth at bedtime for headache prevention., Disp: 30 tablet, Rfl: 1  Allergies Erythromycin and Prednisone  Family History  Problem Relation Age of Onset  . Arthritis Mother   . Breast cancer Mother 62  . Heart disease Father   . Arthritis Maternal Grandfather   . Lung cancer Maternal Grandfather     Social History Social History   Tobacco Use  . Smoking status: Current Every Day Smoker    Packs/day: 1.00    Types: Cigarettes  . Smokeless tobacco: Never Used  Substance Use Topics  . Alcohol use: No  . Drug use: No    Review of Systems  Constitutional: No fever/chills Eyes: No visual changes. ENT: No sore throat. Cardiovascular: Denies chest pain. Respiratory: Denies shortness of breath. Gastrointestinal: As above.  Genitourinary: Negative for dysuria. Musculoskeletal: Negative for back pain. Skin: Negative for rash. Neurological: Negative for headaches, focal weakness or numbness.  ____________________________________________   PHYSICAL EXAM:  VITAL SIGNS: ED Triage Vitals [10/08/17 0953]  Enc  Vitals Group     BP 119/88     Pulse Rate (!) 102     Resp 16     Temp 97.8 F (36.6 C)     Temp Source Oral     SpO2 98 %     Weight 201 lb 15.1 oz (91.6 kg)     Height 5\' 3"  (1.6 m)     Head Circumference      Peak Flow      Pain Score 0     Pain Loc      Pain Edu?      Excl. in [place]?     Constitutional: Alert and oriented. Well appearing and in no acute distress. ENT      Head: Normocephalic and atraumatic.      Mouth/Throat: Mucous membranes are moist.Oropharynx non-erythematous. Cardiovascular: Normal rate, regular rhythm. Grossly normal heart sounds.  Good peripheral circulation. Respiratory: Normal respiratory effort without tachypnea nor retractions. Breath sounds are clear and equal bilaterally. No wheezes, rales, rhonchi. Gastrointestinal: Normal Bowel sounds. Obese abdomen. Soft and nontender. No CVA tenderness. Musculoskeletal: Steady gait. No midline cervical, thoracic or lumbar tenderness to palpation.  Neurologic:  Normal speech and language. Speech is normal. No gait instability.  Skin:  Skin is warm, dry and intact. No rash noted. Psychiatric: Mood and affect are normal. Speech and behavior are normal. Patient exhibits appropriate insight and judgment   ___________________________________________   LABS (all labs ordered are listed, but only abnormal results are displayed)  Labs Reviewed  BASIC METABOLIC PANEL - Abnormal; Notable for the following components:      Result Value   CO2 20 (*)    Glucose, Bld 103 (*)    All other components within normal limits  GASTROINTESTINAL PANEL BY PCR, STOOL (REPLACES STOOL CULTURE)  C DIFFICILE QUICK SCREEN W PCR REFLEX  LIPASE, BLOOD  CBC WITH DIFFERENTIAL/PLATELET    RADIOLOGY  Dg Abd 2 Views  Result Date: 10/08/2017 CLINICAL DATA:  Nausea, diarrhea EXAM: ABDOMEN - 2 VIEW COMPARISON:  None. FINDINGS: The bowel gas pattern is normal. Multiple colonic air-fluid as can be seen with diarrhea. There is no evidence  of free air. No radio-opaque calculi or other significant radiographic abnormality is seen. IMPRESSION: Multiple colonic air-fluid as can be seen with diarrhea. Electronically Signed   By: Kathreen Devoid   On: 10/08/2017 10:45   ____________________________________________   PROCEDURES Procedures   INITIAL IMPRESSION / ASSESSMENT AND PLAN / ED COURSE  Pertinent labs & imaging results that were available during my care of the patient were reviewed by me and considered in my medical decision making (see chart for details).  Overall well-appearing patient.  No acute distress.  Discussed multiple differentials with patient included but not limited to viral illness, obstruction, diverticulitis, medication reaction,irritable bowel.  Labs obtained and reviewed, overall unremarkable.  Abdomen x-ray as above per radiologist, multiple colonic air-fluid as can be seen with diarrhea.  Patient after Zofran 8 mg ODT, reports feeling better and nausea much improved.  Patient was unable to provide stool samples in urgent care, sent home with patient to return.  Will treat  with supportive Zofran, counseled regarding brat diet, fluids and strict follow-up and return parameters given.Discussed indication, risks and benefits of medications with patient.  Discussed follow up with Primary care physician this week. Discussed follow up and return parameters including no resolution or any worsening concerns. Patient verbalized understanding and agreed to plan.   ____________________________________________   FINAL CLINICAL IMPRESSION(S) / ED DIAGNOSES  Final diagnoses:  Diarrhea, unspecified type  Nausea     ED Discharge Orders         Ordered    ondansetron (ZOFRAN ODT) 8 MG disintegrating tablet  Every 8 hours PRN     10/08/17 1106           Note: This dictation was prepared with Dragon dictation along with smaller phrase technology. Any transcriptional errors that result from this process are  unintentional.          Marylene Land, NP 10/08/17 1138

## 2017-10-08 NOTE — Discharge Instructions (Addendum)
Take medication as prescribed. Rest. Drink plenty of fluids. Follow diet recommendations. Return stool samples.  Follow up with your primary care physician this week as needed. Return to Urgent care for new or worsening concerns.

## 2017-10-08 NOTE — ED Triage Notes (Signed)
Pt states has had nausea and "very bad gas" since Tuesday. Having diarrhea. Denies abd pain. No vomiting. Started a new migraine medication last Sunday and took only 2 doses and then stopped.

## 2017-10-11 ENCOUNTER — Other Ambulatory Visit: Payer: Self-pay | Admitting: Primary Care

## 2017-10-11 DIAGNOSIS — R51 Headache: Principal | ICD-10-CM

## 2017-10-11 DIAGNOSIS — R519 Headache, unspecified: Secondary | ICD-10-CM

## 2017-10-20 ENCOUNTER — Encounter: Payer: BLUE CROSS/BLUE SHIELD | Admitting: Obstetrics & Gynecology

## 2017-10-20 DIAGNOSIS — R32 Unspecified urinary incontinence: Secondary | ICD-10-CM

## 2017-10-28 DIAGNOSIS — M791 Myalgia, unspecified site: Secondary | ICD-10-CM | POA: Diagnosis not present

## 2017-10-28 DIAGNOSIS — M5414 Radiculopathy, thoracic region: Secondary | ICD-10-CM | POA: Diagnosis not present

## 2017-11-14 DIAGNOSIS — M791 Myalgia, unspecified site: Secondary | ICD-10-CM | POA: Diagnosis not present

## 2017-11-14 DIAGNOSIS — M5414 Radiculopathy, thoracic region: Secondary | ICD-10-CM | POA: Diagnosis not present

## 2017-11-21 DIAGNOSIS — M791 Myalgia, unspecified site: Secondary | ICD-10-CM | POA: Diagnosis not present

## 2017-11-21 DIAGNOSIS — M5414 Radiculopathy, thoracic region: Secondary | ICD-10-CM | POA: Diagnosis not present

## 2017-11-28 DIAGNOSIS — M791 Myalgia, unspecified site: Secondary | ICD-10-CM | POA: Diagnosis not present

## 2017-11-28 DIAGNOSIS — M5414 Radiculopathy, thoracic region: Secondary | ICD-10-CM | POA: Diagnosis not present

## 2017-12-05 DIAGNOSIS — M5414 Radiculopathy, thoracic region: Secondary | ICD-10-CM | POA: Diagnosis not present

## 2017-12-05 DIAGNOSIS — M791 Myalgia, unspecified site: Secondary | ICD-10-CM | POA: Diagnosis not present

## 2017-12-26 DIAGNOSIS — M5414 Radiculopathy, thoracic region: Secondary | ICD-10-CM | POA: Diagnosis not present

## 2017-12-26 DIAGNOSIS — M791 Myalgia, unspecified site: Secondary | ICD-10-CM | POA: Diagnosis not present

## 2017-12-27 NOTE — Progress Notes (Signed)
BP 136/84 (BP Location: Left Arm, Patient Position: Sitting, Cuff Size: Large)   Pulse (!) 109   Temp 98.4 F (36.9 C) (Oral)   Ht 5' 2.5" (1.588 m)   Wt 204 lb 4 oz (92.6 kg)   SpO2 97%   BMI 36.76 kg/m    CC: cough Subjective:    Patient ID: Brittany Pham, female    DOB: [birth year], 52 y.o.   MRN: [id number]  HPI: Brittany Pham is a 52 y.o. female presenting on 12/28/2017 for Cough (C/o productive cough, nasal congestion/drainage and bilateral ear pain off and on. Sxs started 12/26/17. Tried Mucinex, Nyquil/Dayquil, Elderberry and Zicam. Pt accompanied by her husband.)   2d h/o productive cough of phlegm, aching from coughing, R>L earache, sudden onset of symptoms. Coughing fits with post tussive gagging. Feverish. Tooth ache yesterday. Some body aches from cough. HA. Mild ST with PNDrainage.   Some wheezing and dyspnea as well.  No abd pain.   [place]  Has been treating with mucinex DM.  Husband sick recently, son sick recently.  Current smoker.  No h/o asthma.   Migraines - unable to tolerate topamax. Out of imitrex and requests refill.   Relevant past medical, surgical, family and social history reviewed and updated as indicated. Interim medical history since our last visit reviewed. Allergies and medications reviewed and updated. Outpatient Medications Prior to Visit  Medication Sig Dispense Refill  . acyclovir (ZOVIRAX) 400 MG tablet Take 1 tablet three times daily for 5 days as needed for outbreaks. 30 tablet 0  . aspirin-acetaminophen-caffeine (EXCEDRIN MIGRAINE) 250-250-65 MG tablet Take 1 tablet by mouth every 6 (six) hours as needed for headache or migraine.    . DULoxetine (CYMBALTA) 30 MG capsule Take 1 capsule (30 mg total) by mouth daily. 90 capsule 3  . SUMAtriptan (IMITREX) 50 MG tablet TAKE 1 TAB BY MOUTH AT MIGRAINE ONSET. MAY REPEAT IN 2HRS IR HEADACHE PERSISTS OR RECURS. 10 tablet 0  . topiramate (TOPAMAX) 50  MG tablet Take 1 tablet by mouth at bedtime for headache prevention. (Patient not taking: Reported on 12/28/2017) 30 tablet 1  . ondansetron (ZOFRAN ODT) 8 MG disintegrating tablet Take 1 tablet (8 mg total) by mouth every 8 (eight) hours as needed for nausea or vomiting. 20 tablet 0   No facility-administered medications prior to visit.      Per HPI unless specifically indicated in ROS section below Review of Systems     Objective:    BP 136/84 (BP Location: Left Arm, Patient Position: Sitting, Cuff Size: Large)   Pulse (!) 109   Temp 98.4 F (36.9 C) (Oral)   Ht 5' 2.5" (1.588 m)   Wt 204 lb 4 oz (92.6 kg)   SpO2 97%   BMI 36.76 kg/m   Wt Readings from Last 3 Encounters:  12/28/17 204 lb 4 oz (92.6 kg)  10/08/17 201 lb 15.1 oz (91.6 kg)  09/24/17 202 lb (91.6 kg)    Physical Exam  Constitutional: She appears well-developed and well-nourished. No distress.  HENT:  Head: Normocephalic and atraumatic.  Right Ear: Hearing, tympanic membrane, external ear and ear canal normal.  Left Ear: Hearing, tympanic membrane, external ear and ear canal normal.  Nose: Mucosal edema (nasal mucosal erythema/congestion and irritation bilaterally) and rhinorrhea present. Right sinus exhibits maxillary sinus tenderness (mild). Right sinus exhibits no frontal sinus tenderness. Left sinus exhibits maxillary sinus tenderness (mild). Left sinus exhibits no frontal sinus  tenderness.  Mouth/Throat: Uvula is midline and mucous membranes are normal. Posterior oropharyngeal erythema (mild) present. No oropharyngeal exudate, posterior oropharyngeal edema or tonsillar abscesses.  Eyes: Pupils are equal, round, and reactive to light. Conjunctivae and EOM are normal. No scleral icterus.  Neck: Normal range of motion. Neck supple.  Cardiovascular: Normal rate, regular rhythm, normal heart sounds and intact distal pulses.  No murmur heard. Pulmonary/Chest: Effort normal and breath sounds normal. No respiratory  distress. She has no wheezes. She has no rales.  Lymphadenopathy:    She has no cervical adenopathy.  Skin: Skin is warm and dry. No rash noted.  Nursing note and vitals reviewed.  Results for orders placed or performed in visit on 12/28/17  POC Influenza A&B(BINAX/QUICKVUE)  Result Value Ref Range   Influenza A, POC Negative Negative   Influenza B, POC Negative Negative      Assessment & Plan:  Could not tolerate topamax. Requests imitrex refilled.  Problem List Items Addressed This Visit    Chronic migraine   Relevant Medications   SUMAtriptan (IMITREX) 50 MG tablet   Acute respiratory infection - Primary    Flu swab negative Anticipate viral URI with cough given short duration  Supportive care reviewed with patient.  Continue mucinex, cheratussin for night time.  Red flags to seek further care or update [place] for consideration of abx course reviewed as well.           Meds ordered this encounter  Medications  . SUMAtriptan (IMITREX) 50 MG tablet    Sig: May repeat in 2 hours if headache persists or recurs.    Dispense:  10 tablet    Refill:  3  . guaiFENesin-codeine (CHERATUSSIN AC) 100-10 MG/5ML syrup    Sig: Take 5 mLs by mouth 2 (two) times daily as needed.    Dispense:  120 mL    Refill:  0   Orders Placed This Encounter  Procedures  . POC Influenza A&B(BINAX/QUICKVUE)    Follow up plan: Return if symptoms worsen or fail to improve.  Ria Bush, MD

## 2017-12-28 ENCOUNTER — Ambulatory Visit: Payer: BLUE CROSS/BLUE SHIELD | Admitting: Family Medicine

## 2017-12-28 ENCOUNTER — Encounter: Payer: Self-pay | Admitting: Family Medicine

## 2017-12-28 VITALS — BP 136/84 | HR 109 | Temp 98.4°F | Ht 62.5 in | Wt 204.2 lb

## 2017-12-28 DIAGNOSIS — G43709 Chronic migraine without aura, not intractable, without status migrainosus: Secondary | ICD-10-CM | POA: Diagnosis not present

## 2017-12-28 DIAGNOSIS — R059 Cough, unspecified: Secondary | ICD-10-CM

## 2017-12-28 DIAGNOSIS — IMO0002 Reserved for concepts with insufficient information to code with codable children: Secondary | ICD-10-CM

## 2017-12-28 DIAGNOSIS — J22 Unspecified acute lower respiratory infection: Secondary | ICD-10-CM | POA: Insufficient documentation

## 2017-12-28 DIAGNOSIS — R05 Cough: Secondary | ICD-10-CM

## 2017-12-28 LAB — POC INFLUENZA A&B (BINAX/QUICKVUE)
Influenza A, POC: NEGATIVE
Influenza B, POC: NEGATIVE

## 2017-12-28 MED ORDER — SUMATRIPTAN SUCCINATE 50 MG PO TABS: ORAL_TABLET | ORAL | 3 refills | Status: DC

## 2017-12-28 MED ORDER — GUAIFENESIN-CODEINE 100-10 MG/5ML PO SYRP: 5.0000 mL | ORAL_SOLUTION | Freq: Two times a day (BID) | ORAL | 0 refills | Status: DC | PRN

## 2017-12-28 NOTE — Assessment & Plan Note (Signed)
Flu swab negative Anticipate viral URI with cough given short duration  Supportive care reviewed with patient.  Continue mucinex, cheratussin for night time.  Red flags to seek further care or update [place] for consideration of abx course reviewed as well.

## 2017-12-28 NOTE — Patient Instructions (Addendum)
Flu swab was negative. You have an upper respiratory infection. Antibiotics are not needed for this.  Viral infections usually take 7-10 days to resolve. The cough can last a few weeks to go away.  Use medication as prescribed: mucinex during the day, codeine cough syrup at night.  Push fluids and plenty of rest.  Please return if you are not improving as expected, or if you have high fevers (>101.5) or difficulty swallowing or worsening productive cough.  Call clinic with questions.  Good to see you today. I hope you start feeling better soon.

## 2018-01-04 ENCOUNTER — Ambulatory Visit: Payer: BLUE CROSS/BLUE SHIELD | Admitting: Primary Care

## 2018-01-04 ENCOUNTER — Encounter: Payer: Self-pay | Admitting: Primary Care

## 2018-01-04 ENCOUNTER — Other Ambulatory Visit: Payer: BLUE CROSS/BLUE SHIELD

## 2018-01-04 VITALS — BP 122/78 | HR 76 | Temp 97.6°F | Ht 62.5 in | Wt 203.8 lb

## 2018-01-04 DIAGNOSIS — L820 Inflamed seborrheic keratosis: Secondary | ICD-10-CM | POA: Diagnosis not present

## 2018-01-04 DIAGNOSIS — D229 Melanocytic nevi, unspecified: Secondary | ICD-10-CM

## 2018-01-04 DIAGNOSIS — Z23 Encounter for immunization: Secondary | ICD-10-CM

## 2018-01-04 NOTE — Patient Instructions (Signed)
Keep the site clean and dry.   We will send your mole off for pathology.   Please call me if you notice redness, fevers, increased pain.  It was a pleasure to see you today!

## 2018-01-04 NOTE — Progress Notes (Signed)
Subjective:    Patient ID: Brittany Pham, female    DOB: April 30, 1965, 52 y.o.   MRN: [id number]  HPI  Brittany Pham is a 52 year old female who presents today requesting mole removal. The mole is located to the right medial lower extremity, distal to calf.   The mole has been present for years. Denies pain. The mole has been removed before years ago and has grown back. She'd like the mole removed again as she often nicks the mole with clothing, when shaving her legs.   Review of Systems  Skin: Negative for color change.       Nevus        Past Medical History:  Diagnosis Date  . Arthritis   . Chickenpox   . Depression   . Migraines      Social History   Socioeconomic History  . Marital status: Married    Spouse name: Not on file  . Number of children: Not on file  . Years of education: Not on file  . Highest education level: Not on file  Occupational History  . Not on file  Social Needs  . Financial resource strain: Not on file  . Food insecurity:    Worry: Not on file    Inability: Not on file  . Transportation needs:    Medical: Not on file    Non-medical: Not on file  Tobacco Use  . Smoking status: Current Every Day Smoker    Packs/day: 1.00    Types: Cigarettes  . Smokeless tobacco: Never Used  Substance and Sexual Activity  . Alcohol use: No  . Drug use: No  . Sexual activity: Not on file  Lifestyle  . Physical activity:    Days per week: Not on file    Minutes per session: Not on file  . Stress: Not on file  Relationships  . Social connections:    Talks on phone: Not on file    Gets together: Not on file    Attends religious service: Not on file    Active member of club or organization: Not on file    Attends meetings of clubs or organizations: Not on file    Relationship status: Not on file  . Intimate partner violence:    Fear of current or ex partner: Not on file    Emotionally abused: Not on file    Physically abused: Not on file   Forced sexual activity: Not on file  Other Topics Concern  . Not on file  Social History Narrative   Married.   2 children, 4 step children, 1 grandchild.    Works in [employer].    Enjoys swimming, spending time with family.     Past Surgical History:  Procedure Laterality Date  . CERVICAL ABLATION  2007  . CERVICAL SPINE SURGERY  2014    Family History  Problem Relation Age of Onset  . Arthritis Mother   . Breast cancer Mother 41  . Heart disease Father   . Arthritis Maternal Grandfather   . Lung cancer Maternal Grandfather     Allergies  Allergen Reactions  . Erythromycin Nausea And Vomiting  . Prednisone Other (See Comments)    Passes out     Current Outpatient Medications on File Prior to Visit  Medication Sig Dispense Refill  . acyclovir (ZOVIRAX) 400 MG tablet Take 1 tablet three times daily for 5 days as needed for outbreaks. 30 tablet 0  . aspirin-acetaminophen-caffeine (EXCEDRIN MIGRAINE)  250-250-65 MG tablet Take 1 tablet by mouth every 6 (six) hours as needed for headache or migraine.    . DULoxetine (CYMBALTA) 30 MG capsule Take 1 capsule (30 mg total) by mouth daily. 90 capsule 3  . guaiFENesin-codeine (CHERATUSSIN AC) 100-10 MG/5ML syrup Take 5 mLs by mouth 2 (two) times daily as needed. 120 mL 0  . SUMAtriptan (IMITREX) 50 MG tablet May repeat in 2 hours if headache persists or recurs. 10 tablet 3   No current facility-administered medications on file prior to visit.     BP 122/78   Pulse 76   Temp 97.6 F (36.4 C) (Oral)   Ht 5' 2.5" (1.588 m)   Wt 203 lb 12 oz (92.4 kg)   SpO2 97%   BMI 36.67 kg/m    Objective:   Physical Exam  Constitutional: She appears well-nourished.  Skin: Skin is warm and dry. No erythema.  1 cm circular dark brown nevus to right medial lower extremity distal to calf. Non tender.            Assessment & Plan:  Nevus Removal:  Located to right lower extremity as noted. Consent signed. Site prepped with  betadine solution. Lidocaine 1% with epi used for analgesia.  Nevus removed with shave biopsy tool. Cautery tool used for bleeding.  Site cleansed and dressed with dressing. Patient tolerated well.  Nevus sent off for pathology evaluation.  Discussed home care instructions.  Pleas Koch, NP

## 2018-01-09 DIAGNOSIS — M791 Myalgia, unspecified site: Secondary | ICD-10-CM | POA: Diagnosis not present

## 2018-01-09 DIAGNOSIS — M5414 Radiculopathy, thoracic region: Secondary | ICD-10-CM | POA: Diagnosis not present

## 2018-01-21 ENCOUNTER — Other Ambulatory Visit: Payer: Self-pay | Admitting: Primary Care

## 2018-01-21 DIAGNOSIS — A6000 Herpesviral infection of urogenital system, unspecified: Secondary | ICD-10-CM

## 2018-01-30 DIAGNOSIS — M5414 Radiculopathy, thoracic region: Secondary | ICD-10-CM | POA: Diagnosis not present

## 2018-01-30 DIAGNOSIS — M791 Myalgia, unspecified site: Secondary | ICD-10-CM | POA: Diagnosis not present

## 2018-02-27 DIAGNOSIS — M791 Myalgia, unspecified site: Secondary | ICD-10-CM | POA: Diagnosis not present

## 2018-02-27 DIAGNOSIS — M5414 Radiculopathy, thoracic region: Secondary | ICD-10-CM | POA: Diagnosis not present

## 2018-03-06 DIAGNOSIS — M791 Myalgia, unspecified site: Secondary | ICD-10-CM | POA: Diagnosis not present

## 2018-03-06 DIAGNOSIS — M5414 Radiculopathy, thoracic region: Secondary | ICD-10-CM | POA: Diagnosis not present

## 2018-03-07 ENCOUNTER — Other Ambulatory Visit: Payer: Self-pay | Admitting: Primary Care

## 2018-03-07 ENCOUNTER — Other Ambulatory Visit: Payer: Self-pay | Admitting: Family Medicine

## 2018-03-07 DIAGNOSIS — A6000 Herpesviral infection of urogenital system, unspecified: Secondary | ICD-10-CM

## 2018-03-07 NOTE — Telephone Encounter (Signed)
Name of Medication: Cheratussin [facility] Name of Pharmacy: [facility] Last Fill or Written Date and Quantity: 12/28/17, #149ml/0 Last Office Visit and Type: 01/04/18, acute Next Office Visit and Type: 09/22/18, CPE Last Controlled Substance Agreement Date: none Last UDS: none

## 2018-03-08 NOTE — Telephone Encounter (Signed)
[facility] faxed note on 03/07/18 that pt wants to know why acyclovir was not refilled. Acyclovir last refill # 30 on 01/23/18 with instructions take 1 tab tid for 5 days prn for outbreak.Please advise. Reason not refilled was too early to refill.

## 2018-03-15 NOTE — Telephone Encounter (Signed)
Pt left v/m requesting cb why acyclovir was denied.

## 2018-03-16 NOTE — Telephone Encounter (Signed)
Please call patient and ask how often she's having to take this medication. This is only used as needed for outbreaks. If she's having more than 3 outbreaks per year then the treatment is different. The amount provided during her last refill should have lasted her for a total of 2 outbreaks. Has she had two outbreaks?

## 2018-03-17 MED ORDER — ACYCLOVIR 400 MG PO TABS: ORAL_TABLET | ORAL | 0 refills | Status: DC

## 2018-03-17 MED ORDER — ACYCLOVIR 400 MG PO TABS: 400.0000 mg | ORAL_TABLET | Freq: Two times a day (BID) | ORAL | 1 refills | Status: DC

## 2018-03-17 NOTE — Telephone Encounter (Signed)
Please notify patient:  I sent patient two prescriptions for her acyclovir.  1. The first one is to be taken TID x 5 days for current outbreak. 2. The second Rx is for her to take BID daily for prevention of outbreaks.

## 2018-03-17 NOTE — Telephone Encounter (Signed)
Patient stated that she has at least 5 outbreaks since last refills. She stated that they previous provider use to have her take it daily. Patient is currently having an outbreak as well.

## 2018-03-17 NOTE — Telephone Encounter (Signed)
Per DPR, left detail message of Kate Clark's comments for patient. 

## 2018-03-17 NOTE — Addendum Note (Signed)
Addended by: Pleas Koch on: 03/17/2018 02:59 PM   Modules accepted: Orders

## 2018-03-20 DIAGNOSIS — M791 Myalgia, unspecified site: Secondary | ICD-10-CM | POA: Diagnosis not present

## 2018-03-20 DIAGNOSIS — M5414 Radiculopathy, thoracic region: Secondary | ICD-10-CM | POA: Diagnosis not present

## 2018-03-20 NOTE — Telephone Encounter (Signed)
Per DPR, left detail message of Kate Clark's comments for patient. 

## 2018-04-03 ENCOUNTER — Other Ambulatory Visit: Payer: Self-pay | Admitting: Primary Care

## 2018-04-03 DIAGNOSIS — R51 Headache: Principal | ICD-10-CM

## 2018-04-03 DIAGNOSIS — R519 Headache, unspecified: Secondary | ICD-10-CM

## 2018-04-05 DIAGNOSIS — J069 Acute upper respiratory infection, unspecified: Secondary | ICD-10-CM | POA: Diagnosis not present

## 2018-04-05 DIAGNOSIS — R6889 Other general symptoms and signs: Secondary | ICD-10-CM | POA: Diagnosis not present

## 2018-05-01 ENCOUNTER — Other Ambulatory Visit: Payer: Self-pay | Admitting: Family Medicine

## 2018-05-01 DIAGNOSIS — G43709 Chronic migraine without aura, not intractable, without status migrainosus: Secondary | ICD-10-CM

## 2018-05-01 DIAGNOSIS — IMO0002 Reserved for concepts with insufficient information to code with codable children: Secondary | ICD-10-CM

## 2018-05-02 NOTE — Telephone Encounter (Signed)
Please call patient: She was provided with 10 tablets with 3 refills in October 2019. How often is she taking the sumatriptan (Imitrex) medication?  Does she actually need a refill? If she is experiencing more than 3 migraines monthly and she needs to be on preventative treatment for migraines. Let me know.

## 2018-05-02 NOTE — Telephone Encounter (Signed)
Last prescribed on 12/28/2017 by Dr Darnell Level . Last office visit on 01/04/2018. No future appointment

## 2018-05-03 NOTE — Telephone Encounter (Signed)
Pt left v/m requesting cb about status of sumatriptan refill.

## 2018-05-03 NOTE — Telephone Encounter (Signed)
Brittany Pham, please schedule patient for an office visit for migraines.  Notify her that I would like to see her to discuss other treatment for frequent migraines.  Refill sent to pharmacy.

## 2018-05-03 NOTE — Telephone Encounter (Signed)
Spoken to patient. She stated that it really depends on the month and weather effects her too. She stated about 4 to 5 a month. Yes, she need a refill. She stated that insurance usually cover 8. She stated that Anda Kraft had try Topiramate which made her feel worse.

## 2018-05-09 DIAGNOSIS — H524 Presbyopia: Secondary | ICD-10-CM | POA: Insufficient documentation

## 2018-05-09 DIAGNOSIS — H5213 Myopia, bilateral: Secondary | ICD-10-CM | POA: Diagnosis not present

## 2018-05-09 DIAGNOSIS — R6889 Other general symptoms and signs: Secondary | ICD-10-CM | POA: Diagnosis not present

## 2018-05-09 DIAGNOSIS — J101 Influenza due to other identified influenza virus with other respiratory manifestations: Secondary | ICD-10-CM | POA: Diagnosis not present

## 2018-05-10 ENCOUNTER — Ambulatory Visit: Payer: BLUE CROSS/BLUE SHIELD | Admitting: Primary Care

## 2018-05-16 ENCOUNTER — Other Ambulatory Visit: Payer: Self-pay

## 2018-05-16 ENCOUNTER — Ambulatory Visit (INDEPENDENT_AMBULATORY_CARE_PROVIDER_SITE_OTHER): Payer: BLUE CROSS/BLUE SHIELD | Admitting: Primary Care

## 2018-05-16 VITALS — BP 122/82 | HR 78 | Temp 98.2°F | Ht 62.5 in | Wt 199.8 lb

## 2018-05-16 DIAGNOSIS — R05 Cough: Secondary | ICD-10-CM | POA: Diagnosis not present

## 2018-05-16 DIAGNOSIS — R519 Headache, unspecified: Secondary | ICD-10-CM

## 2018-05-16 DIAGNOSIS — R058 Other specified cough: Secondary | ICD-10-CM | POA: Insufficient documentation

## 2018-05-16 DIAGNOSIS — R51 Headache: Secondary | ICD-10-CM

## 2018-05-16 HISTORY — DX: Other specified cough: R05.8

## 2018-05-16 HISTORY — DX: Cough: R05

## 2018-05-16 MED ORDER — PROPRANOLOL HCL ER 80 MG PO CP24: 80.0000 mg | ORAL_CAPSULE | Freq: Every day | ORAL | 1 refills | Status: DC

## 2018-05-16 MED ORDER — HYDROCOD POLST-CPM POLST ER 10-8 MG/5ML PO SUER: 5.0000 mL | Freq: Two times a day (BID) | ORAL | 0 refills | Status: DC | PRN

## 2018-05-16 MED ORDER — PREDNISONE 20 MG PO TABS: ORAL_TABLET | ORAL | 0 refills | Status: DC

## 2018-05-16 NOTE — Assessment & Plan Note (Signed)
Headaches occurring nearly every day, migraines 2-3 times monthly.  Could not tolerate topiramate.  Will trial low-dose propanolol for which she will start once her cough/cold symptoms have resolved.  She will update after 4 weeks of propanolol ER 80 mg trial.

## 2018-05-16 NOTE — Progress Notes (Signed)
Subjective:    Patient ID: Brittany Pham, female    DOB: 11-23-1965, 53 y.o.   MRN: [id number]  HPI  Brittany Pham is a 53 year old female with a history of tobacco abuse, frequent headaches who presents today with a chief complaint of migraines/headaches and cough.  1) Cough:  Originally evaluated at [facility] on 04/05/18 with a two-three day history of cough and chest congestion. Exam was consistent for viral involvement so she was treated with an albuterol inhaler, Bromfed DM syrup, and Astelin nasal spray. Tested negative for influenza.   Evaluated again on 05/09/18 for a 12 hour history of chills, body aches, cough, and continued cough since evaluated in early February 2020. She tested negative for influenza A and was treated with Tamiflu and Nyquil.   Since her visit at Urgent Care she continues to cough. No fever in one week. She denies increased shortness of breath, sick contacts. She's now taking Dayquil without improvement. She has used the albuterol inhaler without much improvement. She is a current smoker and is smoking 5-6 cigarettes daily, previously one pack per day.  2) Migraines/Frequent Headaches: Currently managed on sumatriptan 50 mg for which she is taking . She was initiated on low dose Topamax several months but she stopped taking as she could not tolerate due to nausea.  Headaches occur every 2-3 days lasting several hours. She will take Excedrin Migraine. Migraines occur 2-3 times monthly and are located to the frontal lobe and occipital lobes. She will have to take two doses of her sumatriptan tablets per migraine.  She will experience nausea, photophobia with migraines.  She has never tried any other preventative medication other than Topamax.  Review of Systems  Constitutional: Negative for fever.  HENT: Positive for congestion and postnasal drip. Negative for sinus pressure and sore throat.   Respiratory: Positive for cough. Negative for  shortness of breath and wheezing.   Neurological: Positive for headaches.       Past Medical History:  Diagnosis Date  . Arthritis   . Chickenpox   . Depression   . Migraines      Social History   Socioeconomic History  . Marital status: Married    Spouse name: Not on file  . Number of children: Not on file  . Years of education: Not on file  . Highest education level: Not on file  Occupational History  . Not on file  Social Needs  . Financial resource strain: Not on file  . Food insecurity:    Worry: Not on file    Inability: Not on file  . Transportation needs:    Medical: Not on file    Non-medical: Not on file  Tobacco Use  . Smoking status: Current Every Day Smoker    Packs/day: 1.00    Types: Cigarettes  . Smokeless tobacco: Never Used  Substance and Sexual Activity  . Alcohol use: No  . Drug use: No  . Sexual activity: Not on file  Lifestyle  . Physical activity:    Days per week: Not on file    Minutes per session: Not on file  . Stress: Not on file  Relationships  . Social connections:    Talks on phone: Not on file    Gets together: Not on file    Attends religious service: Not on file    Active member of club or organization: Not on file    Attends meetings of clubs or organizations: Not  on file    Relationship status: Not on file  . Intimate partner violence:    Fear of current or ex partner: Not on file    Emotionally abused: Not on file    Physically abused: Not on file    Forced sexual activity: Not on file  Other Topics Concern  . Not on file  Social History Narrative   Married.   2 children, 4 step children, 1 grandchild.    Works in [employer].    Enjoys swimming, spending time with family.     Past Surgical History:  Procedure Laterality Date  . CERVICAL ABLATION  2007  . CERVICAL SPINE SURGERY  2014    Family History  Problem Relation Age of Onset  . Arthritis Mother   . Breast cancer Mother 49  . Heart disease Father   .  Arthritis Maternal Grandfather   . Lung cancer Maternal Grandfather     Allergies  Allergen Reactions  . Azithromycin Nausea And Vomiting  . Erythromycin Nausea And Vomiting  . Prednisone Other (See Comments)    Passes out  syncope    Current Outpatient Medications on File Prior to Visit  Medication Sig Dispense Refill  . acyclovir (ZOVIRAX) 400 MG tablet Take 1 tablet by mouth three times daily for 5 days for current outbreak. 15 tablet 0  . acyclovir (ZOVIRAX) 400 MG tablet Take 1 tablet (400 mg total) by mouth 2 (two) times daily. For herpes prevention. 180 tablet 1  . aspirin-acetaminophen-caffeine (EXCEDRIN MIGRAINE) 250-250-65 MG tablet Take 1 tablet by mouth every 6 (six) hours as needed for headache or migraine.    . DULoxetine (CYMBALTA) 30 MG capsule Take 1 capsule (30 mg total) by mouth daily. 90 capsule 3  . SUMAtriptan (IMITREX) 50 MG tablet MAY REPEAT IN 2 HOURS IF HEADACHE PERSISTS OR RECURS. 10 tablet 0   No current facility-administered medications on file prior to visit.     BP 122/82   Pulse 78   Temp 98.2 F (36.8 C) (Oral)   Ht 5' 2.5" (1.588 m)   Wt 199 lb 12 oz (90.6 kg)   SpO2 97%   BMI 35.95 kg/m    Objective:   Physical Exam  Constitutional: She appears well-nourished. She does not appear ill.  HENT:  Right Ear: Tympanic membrane and ear canal normal.  Left Ear: Tympanic membrane and ear canal normal.  Nose: No mucosal edema. Right sinus exhibits no maxillary sinus tenderness and no frontal sinus tenderness. Left sinus exhibits no maxillary sinus tenderness and no frontal sinus tenderness.  Mouth/Throat: Oropharynx is clear and moist.  Neck: Neck supple.  Cardiovascular: Normal rate and regular rhythm.  Respiratory: Effort normal and breath sounds normal. She has no wheezes.  Dry cough during exam  Skin: Skin is warm and dry.           Assessment & Plan:

## 2018-05-16 NOTE — Assessment & Plan Note (Signed)
Exam today overall clear lungs and appears stable.  She is afebrile.  Suspect post viral cough to be etiology for symptoms.  Discussed that cough may last 4 to 6 weeks after treatment.  Given history of COPD with shortness of breath will provide her with a prednisone burst for 5 days.  Continue albuterol as needed.  Prescription for Tussionex provided to use at bedtime to help with sleep.  Return precautions provided.

## 2018-05-16 NOTE — Patient Instructions (Signed)
Start propranolol ER 80 mg for headache prevention. Start this after you complete the prednisone course. Please update me four weeks after you start.  Start prednisone 20 mg tablets. Take 2 tablets daily for 5 days.  You may take the cough suppressant every 12 hours as needed for cough and rest. Caution this medication contains codeine which may cause drowsiness.   Shortness of Breath/Wheezing/Cough: Use the albuterol inhaler. Inhale 2 puffs into the lungs every 4 to 6 hours as needed for wheezing, cough, and/or shortness of breath.   It was a pleasure to see you today!

## 2018-05-20 ENCOUNTER — Other Ambulatory Visit: Payer: Self-pay | Admitting: Primary Care

## 2018-05-20 DIAGNOSIS — R51 Headache: Principal | ICD-10-CM

## 2018-05-20 DIAGNOSIS — R519 Headache, unspecified: Secondary | ICD-10-CM

## 2018-05-23 NOTE — Telephone Encounter (Signed)
Patient could not tolerate medication. Will refuse refill.

## 2018-06-07 ENCOUNTER — Other Ambulatory Visit: Payer: Self-pay | Admitting: Primary Care

## 2018-06-07 DIAGNOSIS — R519 Headache, unspecified: Secondary | ICD-10-CM

## 2018-06-07 DIAGNOSIS — R51 Headache: Principal | ICD-10-CM

## 2018-06-07 NOTE — Telephone Encounter (Signed)
Last prescribed on 05/16/2018. Last office visit on 05/16/2018. No future appointment

## 2018-06-07 NOTE — Telephone Encounter (Signed)
How is she doing on the propanolol medication for migraine prevention? Would she like a refill?

## 2018-06-08 NOTE — Telephone Encounter (Signed)
Per DPR, left detail message of Kate Clark's comments for patient to call back 

## 2018-07-22 DIAGNOSIS — Z1159 Encounter for screening for other viral diseases: Secondary | ICD-10-CM | POA: Diagnosis not present

## 2018-07-27 ENCOUNTER — Other Ambulatory Visit: Payer: Self-pay | Admitting: Primary Care

## 2018-07-27 DIAGNOSIS — R519 Headache, unspecified: Secondary | ICD-10-CM

## 2018-08-31 ENCOUNTER — Other Ambulatory Visit: Payer: Self-pay | Admitting: Primary Care

## 2018-08-31 DIAGNOSIS — R519 Headache, unspecified: Secondary | ICD-10-CM

## 2018-09-07 ENCOUNTER — Other Ambulatory Visit: Payer: Self-pay | Admitting: Primary Care

## 2018-09-07 DIAGNOSIS — A6000 Herpesviral infection of urogenital system, unspecified: Secondary | ICD-10-CM

## 2018-09-07 DIAGNOSIS — F419 Anxiety disorder, unspecified: Secondary | ICD-10-CM

## 2018-09-07 DIAGNOSIS — F32A Depression, unspecified: Secondary | ICD-10-CM

## 2018-09-07 DIAGNOSIS — F329 Major depressive disorder, single episode, unspecified: Secondary | ICD-10-CM

## 2018-09-12 ENCOUNTER — Other Ambulatory Visit: Payer: Self-pay | Admitting: Primary Care

## 2018-09-12 ENCOUNTER — Ambulatory Visit (INDEPENDENT_AMBULATORY_CARE_PROVIDER_SITE_OTHER): Payer: BC Managed Care – PPO | Admitting: Primary Care

## 2018-09-12 ENCOUNTER — Encounter: Payer: Self-pay | Admitting: Primary Care

## 2018-09-12 ENCOUNTER — Other Ambulatory Visit: Payer: Self-pay

## 2018-09-12 DIAGNOSIS — E785 Hyperlipidemia, unspecified: Secondary | ICD-10-CM

## 2018-09-12 DIAGNOSIS — F32A Depression, unspecified: Secondary | ICD-10-CM

## 2018-09-12 DIAGNOSIS — R7303 Prediabetes: Secondary | ICD-10-CM

## 2018-09-12 DIAGNOSIS — F419 Anxiety disorder, unspecified: Secondary | ICD-10-CM

## 2018-09-12 DIAGNOSIS — F329 Major depressive disorder, single episode, unspecified: Secondary | ICD-10-CM

## 2018-09-12 MED ORDER — DULOXETINE HCL 30 MG PO CPEP: 60.0000 mg | ORAL_CAPSULE | Freq: Every day | ORAL | 0 refills | Status: DC

## 2018-09-12 NOTE — Assessment & Plan Note (Signed)
Deteriorated due to personal stressors. Overall she's felt well on Cymbalta so we will start with a dose increase. She just refilled her prescription of 30 mg capsules so we will increase her dose to 60 mg for now. We will plan to see her back in one month for follow up.  Denies SI/HI.

## 2018-09-12 NOTE — Progress Notes (Signed)
Subjective:    Patient ID: Brittany Pham, female    DOB: 1966/02/17, 54 y.o.   MRN: [id number]  HPI  Virtual Visit via Video Note  I connected with Brittany Pham on 09/12/18 at  8:40 AM EDT by a video enabled telemedicine application and verified that I am speaking with the correct person using two identifiers.  Location: Patient: Parking lot Provider: Office    I discussed the limitations of evaluation and management by telemedicine and the availability of in person appointments. The patient expressed understanding and agreed to proceed.  History of Present Illness:  Brittany Pham is a 53 year old female with a history of anxiety and depression, chronic neck pain who presents today to discuss Cymbalta.   She is currently managed on duloxetine 30 mg for which she has taken for 10 years, over the last three months she's felt an increase in her depression symptoms and doesn't feel as though her medication has been effective. Overall she does think her Cymbalta has been effective but not over the last several months.   Symptoms include little motivation to do anything, "little drive", decrease in energy, increased family stress, feeling down. PHQ 9 score of 16 today. Denies SI/HI, anxiety symptoms.    Observations/Objective:  Alert and oriented. Appears well, not sickly. No distress. Speaking in complete sentences.   Assessment and Plan:  Deteriorated due to personal stressors. Overall she's felt well on Cymbalta so we will start with a dose increase. She just refilled her prescription of 30 mg capsules so we will increase her dose to 60 mg for now. We will plan to see her back in one month for follow up.  Denies SI/HI.  Follow Up Instructions:  We've increased your dose of duloxetine (Cymbalta) to 60 mg, you may take two capsules from your current bottle for now.  Schedule a follow up visit with me in one month.  It was a pleasure to see you today! Allie Bossier, NP-C     I discussed the assessment and treatment plan with the patient. The patient was provided an opportunity to ask questions and all were answered. The patient agreed with the plan and demonstrated an understanding of the instructions.   The patient was advised to call back or seek an in-person evaluation if the symptoms worsen or if the condition fails to improve as anticipated.     Pleas Koch, NP    Review of Systems  Respiratory: Negative for shortness of breath.   Cardiovascular: Negative for chest pain.  Neurological: Negative for dizziness.  Psychiatric/Behavioral:       See HPI       Past Medical History:  Diagnosis Date  . Arthritis   . Chickenpox   . Depression   . Migraines      Social History   Socioeconomic History  . Marital status: Married    Spouse name: Not on file  . Number of children: Not on file  . Years of education: Not on file  . Highest education level: Not on file  Occupational History  . Not on file  Social Needs  . Financial resource strain: Not on file  . Food insecurity    Worry: Not on file    Inability: Not on file  . Transportation needs    Medical: Not on file    Non-medical: Not on file  Tobacco Use  . Smoking status: Current Every Day Smoker    Packs/day: 1.00  Types: Cigarettes  . Smokeless tobacco: Never Used  Substance and Sexual Activity  . Alcohol use: No  . Drug use: No  . Sexual activity: Not on file  Lifestyle  . Physical activity    Days per week: Not on file    Minutes per session: Not on file  . Stress: Not on file  Relationships  . Social Herbalist on phone: Not on file    Gets together: Not on file    Attends religious service: Not on file    Active member of club or organization: Not on file    Attends meetings of clubs or organizations: Not on file    Relationship status: Not on file  . Intimate partner violence    Fear of current or ex partner: Not on file    Emotionally  abused: Not on file    Physically abused: Not on file    Forced sexual activity: Not on file  Other Topics Concern  . Not on file  Social History Narrative   Married.   2 children, 4 step children, 1 grandchild.    Works in [employer].    Enjoys swimming, spending time with family.     Past Surgical History:  Procedure Laterality Date  . CERVICAL ABLATION  2007  . CERVICAL SPINE SURGERY  2014    Family History  Problem Relation Age of Onset  . Arthritis Mother   . Breast cancer Mother 52  . Heart disease Father   . Arthritis Maternal Grandfather   . Lung cancer Maternal Grandfather     Allergies  Allergen Reactions  . Azithromycin Nausea And Vomiting  . Erythromycin Nausea And Vomiting  . Prednisone Other (See Comments)    Passes out  syncope    Current Outpatient Medications on File Prior to Visit  Medication Sig Dispense Refill  . acyclovir (ZOVIRAX) 400 MG tablet TAKE 1 TABLET (400 MG TOTAL) BY MOUTH 2 (TWO) TIMES DAILY. FOR HERPES PREVENTION. 180 tablet 0  . aspirin-acetaminophen-caffeine (EXCEDRIN MIGRAINE) 250-250-65 MG tablet Take 1 tablet by mouth every 6 (six) hours as needed for headache or migraine.    . chlorpheniramine-HYDROcodone (TUSSIONEX PENNKINETIC ER) 10-8 MG/5ML SUER Take 5 mLs by mouth every 12 (twelve) hours as needed for cough. 50 mL 0  . predniSONE (DELTASONE) 20 MG tablet Take 2 tablets daily for 5 days. 10 tablet 0  . propranolol ER (INDERAL LA) 80 MG 24 hr capsule TAKE 1 CAPSULE (80 MG TOTAL) BY MOUTH DAILY. FOR HEADACHE PREVENTION. 30 capsule 0  . SUMAtriptan (IMITREX) 50 MG tablet MAY REPEAT IN 2 HOURS IF HEADACHE PERSISTS OR RECURS. 10 tablet 0   No current facility-administered medications on file prior to visit.     Wt 192 lb (87.1 kg)   BMI 34.56 kg/m    Objective:   Physical Exam  Constitutional: She is oriented to person, place, and time. She appears well-nourished.  Respiratory: Effort normal.  Neurological: She is alert and  oriented to person, place, and time.  Psychiatric: She has a normal mood and affect.           Assessment & Plan:

## 2018-09-12 NOTE — Patient Instructions (Signed)
We've increased your dose of duloxetine (Cymbalta) to 60 mg, you may take two capsules from your current bottle for now.  Schedule a follow up visit with me in one month.  It was a pleasure to see you today! Allie Bossier, NP-C

## 2018-09-19 ENCOUNTER — Other Ambulatory Visit: Payer: BLUE CROSS/BLUE SHIELD

## 2018-09-22 ENCOUNTER — Ambulatory Visit (INDEPENDENT_AMBULATORY_CARE_PROVIDER_SITE_OTHER): Payer: BC Managed Care – PPO | Admitting: Primary Care

## 2018-09-22 ENCOUNTER — Other Ambulatory Visit: Payer: Self-pay

## 2018-09-22 ENCOUNTER — Other Ambulatory Visit: Payer: Self-pay | Admitting: Primary Care

## 2018-09-22 ENCOUNTER — Other Ambulatory Visit (HOSPITAL_COMMUNITY)
Admission: RE | Admit: 2018-09-22 | Discharge: 2018-09-22 | Disposition: A | Discharge status: Home / Self Care | Payer: BC Managed Care – PPO | Source: Ambulatory Visit | Attending: Primary Care | Admitting: Primary Care

## 2018-09-22 VITALS — BP 122/80 | HR 98 | Temp 97.8°F | Ht 62.5 in | Wt 201.5 lb

## 2018-09-22 DIAGNOSIS — F32A Depression, unspecified: Secondary | ICD-10-CM

## 2018-09-22 DIAGNOSIS — M542 Cervicalgia: Secondary | ICD-10-CM

## 2018-09-22 DIAGNOSIS — R51 Headache: Secondary | ICD-10-CM

## 2018-09-22 DIAGNOSIS — Z1239 Encounter for other screening for malignant neoplasm of breast: Secondary | ICD-10-CM | POA: Diagnosis not present

## 2018-09-22 DIAGNOSIS — Z0000 Encounter for general adult medical examination without abnormal findings: Secondary | ICD-10-CM | POA: Diagnosis not present

## 2018-09-22 DIAGNOSIS — IMO0002 Reserved for concepts with insufficient information to code with codable children: Secondary | ICD-10-CM

## 2018-09-22 DIAGNOSIS — G43709 Chronic migraine without aura, not intractable, without status migrainosus: Secondary | ICD-10-CM

## 2018-09-22 DIAGNOSIS — Z124 Encounter for screening for malignant neoplasm of cervix: Secondary | ICD-10-CM | POA: Insufficient documentation

## 2018-09-22 DIAGNOSIS — Z1211 Encounter for screening for malignant neoplasm of colon: Secondary | ICD-10-CM | POA: Diagnosis not present

## 2018-09-22 DIAGNOSIS — F329 Major depressive disorder, single episode, unspecified: Secondary | ICD-10-CM

## 2018-09-22 DIAGNOSIS — R7303 Prediabetes: Secondary | ICD-10-CM | POA: Diagnosis not present

## 2018-09-22 DIAGNOSIS — A6000 Herpesviral infection of urogenital system, unspecified: Secondary | ICD-10-CM

## 2018-09-22 DIAGNOSIS — R519 Headache, unspecified: Secondary | ICD-10-CM

## 2018-09-22 DIAGNOSIS — F419 Anxiety disorder, unspecified: Secondary | ICD-10-CM

## 2018-09-22 DIAGNOSIS — N6489 Other specified disorders of breast: Secondary | ICD-10-CM

## 2018-09-22 DIAGNOSIS — G8929 Other chronic pain: Secondary | ICD-10-CM

## 2018-09-22 LAB — COMPREHENSIVE METABOLIC PANEL
ALT: 16 U/L (ref 0–35)
AST: 13 U/L (ref 0–37)
Albumin: 4.4 g/dL (ref 3.5–5.2)
Alkaline Phosphatase: 51 U/L (ref 39–117)
BUN: 10 mg/dL (ref 6–23)
CO2: 26 mEq/L (ref 19–32)
Calcium: 9.7 mg/dL (ref 8.4–10.5)
Chloride: 103 mEq/L (ref 96–112)
Creatinine, Ser: 0.85 mg/dL (ref 0.40–1.20)
GFR: 70.42 mL/min (ref >60.00)
Glucose, Bld: 105 mg/dL — ABNORMAL HIGH (ref 70–99)
Potassium: 4.2 mEq/L (ref 3.5–5.1)
Sodium: 138 mEq/L (ref 135–145)
Total Bilirubin: 0.4 mg/dL (ref 0.2–1.2)
Total Protein: 6.9 g/dL (ref 6.0–8.3)

## 2018-09-22 LAB — LIPID PANEL
Cholesterol: 207 mg/dL — ABNORMAL HIGH (ref 0–200)
HDL: 45.6 mg/dL (ref >39.00)
LDL Cholesterol: 141 mg/dL — ABNORMAL HIGH (ref 0–99)
NonHDL: 161.82
Total CHOL/HDL Ratio: 5
Triglycerides: 103 mg/dL (ref 0.0–149.0)
VLDL: 20.6 mg/dL (ref 0.0–40.0)

## 2018-09-22 LAB — HEMOGLOBIN A1C: Hgb A1c MFr Bld: 6.1 % (ref 4.6–6.5)

## 2018-09-22 MED ORDER — SUMATRIPTAN SUCCINATE 50 MG PO TABS: ORAL_TABLET | ORAL | 0 refills | Status: DC

## 2018-09-22 MED ORDER — PROPRANOLOL HCL ER 80 MG PO CP24: 160.0000 mg | ORAL_CAPSULE | Freq: Every day | ORAL | 1 refills | Status: DC

## 2018-09-22 NOTE — Assessment & Plan Note (Signed)
Immunizations UTD. Pap smear due, completed today. Colonoscopy due, referral placed. Mammogram due, orders placed. Discussed the importance of a healthy diet and regular exercise in order for weight loss, and to reduce the risk of any potential medical problems. Exam stable. Labs pending.

## 2018-09-22 NOTE — Assessment & Plan Note (Signed)
Doing well overall on daily preventative treatment, infrequent outbreaks. Continue current regimen.

## 2018-09-22 NOTE — Patient Instructions (Addendum)
Stop by the lab prior to leaving today. I will notify you of your results once received.   Start exercising. You should be getting 150 minutes of moderate intensity exercise weekly.  It's important to improve your diet by reducing consumption of fast food, fried food, processed snack foods, sugary drinks. Increase consumption of fresh vegetables and fruits, whole grains, water.  Ensure you are drinking 64 ounces of water daily.  We've increased your propranolol dose to 160 mg, take two of your 80 mg capsules nightly for headache prevention.   Use the sumatriptan as needed for severe migraines.   Please update me in a few weeks regarding your headaches and anxiety/depression.  It was a pleasure to see you today!   Preventive Care 9-21 Years Old, Female Preventive care refers to visits with your health care provider and lifestyle choices that can promote health and wellness. This includes:  A yearly physical exam. This may also be called an annual well check.  Regular dental visits and eye exams.  Immunizations.  Screening for certain conditions.  Healthy lifestyle choices, such as eating a healthy diet, getting regular exercise, not using drugs or products that contain nicotine and tobacco, and limiting alcohol use. What can I expect for my preventive care visit? Physical exam Your health care provider will check your:  Height and weight. This may be used to calculate body mass index (BMI), which tells if you are at a healthy weight.  Heart rate and blood pressure.  Skin for abnormal spots. Counseling Your health care provider may ask you questions about your:  Alcohol, tobacco, and drug use.  Emotional well-being.  Home and relationship well-being.  Sexual activity.  Eating habits.  Work and work Statistician.  Method of birth control.  Menstrual cycle.  Pregnancy history. What immunizations do I need?  Influenza (flu) vaccine  This is recommended every  year. Tetanus, diphtheria, and pertussis (Tdap) vaccine  You may need a Td booster every 10 years. Varicella (chickenpox) vaccine  You may need this if you have not been vaccinated. Zoster (shingles) vaccine  You may need this after age 35. Measles, mumps, and rubella (MMR) vaccine  You may need at least one dose of MMR if you were born in 1957 or later. You may also need a second dose. Pneumococcal conjugate (PCV13) vaccine  You may need this if you have certain conditions and were not previously vaccinated. Pneumococcal polysaccharide (PPSV23) vaccine  You may need one or two doses if you smoke cigarettes or if you have certain conditions. Meningococcal conjugate (MenACWY) vaccine  You may need this if you have certain conditions. Hepatitis A vaccine  You may need this if you have certain conditions or if you travel or work in places where you may be exposed to hepatitis A. Hepatitis B vaccine  You may need this if you have certain conditions or if you travel or work in places where you may be exposed to hepatitis B. Haemophilus influenzae type b (Hib) vaccine  You may need this if you have certain conditions. Human papillomavirus (HPV) vaccine  If recommended by your health care provider, you may need three doses over 6 months. You may receive vaccines as individual doses or as more than one vaccine together in one shot (combination vaccines). Talk with your health care provider about the risks and benefits of combination vaccines. What tests do I need? Blood tests  Lipid and cholesterol levels. These may be checked every 5 years, or more frequently  if you are over 19 years old.  Hepatitis C test.  Hepatitis B test. Screening  Lung cancer screening. You may have this screening every year starting at age 15 if you have a 30-pack-year history of smoking and currently smoke or have quit within the past 15 years.  Colorectal cancer screening. All adults should have this  screening starting at age 74 and continuing until age 38. Your health care provider may recommend screening at age 77 if you are at increased risk. You will have tests every 1-10 years, depending on your results and the type of screening test.  Diabetes screening. This is done by checking your blood sugar (glucose) after you have not eaten for a while (fasting). You may have this done every 1-3 years.  Mammogram. This may be done every 1-2 years. Talk with your health care provider about when you should start having regular mammograms. This may depend on whether you have a family history of breast cancer.  BRCA-related cancer screening. This may be done if you have a family history of breast, ovarian, tubal, or peritoneal cancers.  Pelvic exam and Pap test. This may be done every 3 years starting at age 85. Starting at age 71, this may be done every 5 years if you have a Pap test in combination with an HPV test. Other tests  Sexually transmitted disease (STD) testing.  Bone density scan. This is done to screen for osteoporosis. You may have this scan if you are at high risk for osteoporosis. Follow these instructions at home: Eating and drinking  Eat a diet that includes fresh fruits and vegetables, whole grains, lean protein, and low-fat dairy.  Take vitamin and mineral supplements as recommended by your health care provider.  Do not drink alcohol if: ? Your health care provider tells you not to drink. ? You are pregnant, may be pregnant, or are planning to become pregnant.  If you drink alcohol: ? Limit how much you have to 0-1 drink a day. ? Be aware of how much alcohol is in your drink. In the [place] one drink equals one 12 oz bottle of beer (355 mL), one 5 oz glass of wine (148 mL), or one 1 oz glass of hard liquor (44 mL). Lifestyle  Take daily care of your teeth and gums.  Stay active. Exercise for at least 30 minutes on 5 or more days each week.  Do not use any products that  contain nicotine or tobacco, such as cigarettes, e-cigarettes, and chewing tobacco. If you need help quitting, ask your health care provider.  If you are sexually active, practice safe sex. Use a condom or other form of birth control (contraception) in order to prevent pregnancy and STIs (sexually transmitted infections).  If told by your health care provider, take low-dose aspirin daily starting at age 21. What's next?  Visit your health care provider once a year for a well check visit.  Ask your health care provider how often you should have your eyes and teeth checked.  Stay up to date on all vaccines. This information is not intended to replace advice given to you by your health care provider. Make sure you discuss any questions you have with your health care provider. Document Released: 03/14/2015 Document Revised: 10/27/2017 Document Reviewed: 10/27/2017 Elsevier Patient Education  2020 [employer].

## 2018-09-22 NOTE — Assessment & Plan Note (Signed)
Continued headaches that occur 3-4 times weekly, migraines 3 monthly on current regimen of propranolol ER 80 mg, she is not taking 160 mg.   Will have her increase her dose to 160 mg, she will update.

## 2018-09-22 NOTE — Assessment & Plan Note (Signed)
Improved with recent chiropractor visit, continue to monitor.

## 2018-09-22 NOTE — Assessment & Plan Note (Signed)
Doing better on increased dose of Cymbalta, she will continue to update. Denies SI/HI.

## 2018-09-22 NOTE — Progress Notes (Signed)
Subjective:    Patient ID: Brittany Pham, female    DOB: 10-19-1965, 53 y.o.   MRN: [id number]  HPI  Brittany Pham is a 53 year old female who presents today for complete physical.  Immunizations: -Tetanus: Completed in 2016 -Influenza: Due this season   Diet: She endorses a fair diet. She eats skips breakfast, eats sandwiches and take out food for lunch. Dinner time include grilled lean protein, pizza, hamburgers/hot dogs, vegetables. Desserts 2 times weekly. She drink soda, some water, coffee.  Exercise: She is not exercising   Eye exam: Completed in 2020 Dental exam: Completes semi-annually  Colonoscopy: Completed 8 years ago, due.  Pap Smear: Completed in 2017 Mammogram: Completed in July 2019  BP Readings from Last 3 Encounters:  09/22/18 122/80  05/16/18 122/82  01/04/18 122/78   Wt Readings from Last 3 Encounters:  09/22/18 201 lb 8 oz (91.4 kg)  09/12/18 192 lb (87.1 kg)  05/16/18 199 lb 12 oz (90.6 kg)     Review of Systems  Constitutional: Negative for unexpected weight change.  HENT: Negative for rhinorrhea.   Respiratory: Negative for cough and shortness of breath.   Cardiovascular: Negative for chest pain.  Gastrointestinal: Negative for constipation and diarrhea.  Genitourinary: Negative for difficulty urinating.  Musculoskeletal: Positive for neck pain.       Chronic neck pain, improved with chiropractor visit  Skin: Negative for rash.  Allergic/Immunologic: Negative for environmental allergies.  Neurological: Negative for dizziness, numbness and headaches.  Psychiatric/Behavioral:       Slight improvement with dose increase of Cymbalta.       Past Medical History:  Diagnosis Date  . Arthritis   . Chickenpox   . Depression   . Migraines   . Post-viral cough syndrome 05/16/2018     Social History   Socioeconomic History  . Marital status: Married    Spouse name: Not on file  . Number of children: Not on file  . Years of education: Not on  file  . Highest education level: Not on file  Occupational History  . Not on file  Social Needs  . Financial resource strain: Not on file  . Food insecurity    Worry: Not on file    Inability: Not on file  . Transportation needs    Medical: Not on file    Non-medical: Not on file  Tobacco Use  . Smoking status: Current Every Day Smoker    Packs/day: 1.00    Types: Cigarettes  . Smokeless tobacco: Never Used  Substance and Sexual Activity  . Alcohol use: No  . Drug use: No  . Sexual activity: Not on file  Lifestyle  . Physical activity    Days per week: Not on file    Minutes per session: Not on file  . Stress: Not on file  Relationships  . Social Herbalist on phone: Not on file    Gets together: Not on file    Attends religious service: Not on file    Active member of club or organization: Not on file    Attends meetings of clubs or organizations: Not on file    Relationship status: Not on file  . Intimate partner violence    Fear of current or ex partner: Not on file    Emotionally abused: Not on file    Physically abused: Not on file    Forced sexual activity: Not on file  Other Topics Concern  .  Not on file  Social History Narrative   Married.   2 children, 4 step children, 1 grandchild.    Works in [employer].    Enjoys swimming, spending time with family.     Past Surgical History:  Procedure Laterality Date  . CERVICAL ABLATION  2007  . CERVICAL SPINE SURGERY  2014    Family History  Problem Relation Age of Onset  . Arthritis Mother   . Breast cancer Mother 66  . Heart disease Father   . Arthritis Maternal Grandfather   . Lung cancer Maternal Grandfather     Allergies  Allergen Reactions  . Azithromycin Nausea And Vomiting  . Erythromycin Nausea And Vomiting  . Prednisone Other (See Comments)    Passes out  syncope    Current Outpatient Medications on File Prior to Visit  Medication Sig Dispense Refill  .  aspirin-acetaminophen-caffeine (EXCEDRIN MIGRAINE) 250-250-65 MG tablet Take 1 tablet by mouth every 6 (six) hours as needed for headache.    . propranolol ER (INDERAL LA) 80 MG 24 hr capsule Take 160 mg by mouth daily.    Marland Kitchen acyclovir (ZOVIRAX) 400 MG tablet TAKE 1 TABLET (400 MG TOTAL) BY MOUTH 2 (TWO) TIMES DAILY. FOR HERPES PREVENTION. 180 tablet 0  . DULoxetine (CYMBALTA) 30 MG capsule Take 2 capsules (60 mg total) by mouth daily. 180 capsule 0  . SUMAtriptan (IMITREX) 50 MG tablet MAY REPEAT IN 2 HOURS IF HEADACHE PERSISTS OR RECURS. 10 tablet 0   No current facility-administered medications on file prior to visit.     BP 122/80   Pulse 98   Temp 97.8 F (36.6 C) (Temporal)   Ht 5' 2.5" (1.588 m)   Wt 201 lb 8 oz (91.4 kg)   SpO2 98%   BMI 36.27 kg/m    Objective:   Physical Exam  Constitutional: She is oriented to person, place, and time. She appears well-nourished.  HENT:  Mouth/Throat: No oropharyngeal exudate.  Eyes: Pupils are equal, round, and reactive to light. EOM are normal.  Neck: Neck supple. No thyromegaly present.  Cardiovascular: Normal rate and regular rhythm.  Respiratory: Effort normal and breath sounds normal.  GI: Soft. Bowel sounds are normal. There is no abdominal tenderness.  Musculoskeletal: Normal range of motion.  Neurological: She is alert and oriented to person, place, and time.  Skin: Skin is warm and dry.  Psychiatric: She has a normal mood and affect.           Assessment & Plan:

## 2018-09-22 NOTE — Assessment & Plan Note (Signed)
Repeat A1C pending. Strongly encouraged regular exercise, work on diet.

## 2018-09-22 NOTE — Assessment & Plan Note (Signed)
Experiencing 3 headaches weekly on average on propranolol 80 mg. Increase dose to 160 mg. She will update.

## 2018-09-25 LAB — CYTOLOGY - PAP
Diagnosis: NEGATIVE
HPV: NOT DETECTED

## 2018-09-26 ENCOUNTER — Other Ambulatory Visit: Payer: Self-pay | Admitting: Primary Care

## 2018-09-26 DIAGNOSIS — IMO0002 Reserved for concepts with insufficient information to code with codable children: Secondary | ICD-10-CM

## 2018-09-26 DIAGNOSIS — G43709 Chronic migraine without aura, not intractable, without status migrainosus: Secondary | ICD-10-CM

## 2018-10-06 ENCOUNTER — Ambulatory Visit: Payer: BC Managed Care – PPO

## 2018-10-06 ENCOUNTER — Ambulatory Visit
Admission: RE | Admit: 2018-10-06 | Discharge: 2018-10-06 | Disposition: A | Discharge status: Home / Self Care | Payer: BC Managed Care – PPO | Source: Ambulatory Visit | Attending: Primary Care | Admitting: Primary Care

## 2018-10-06 ENCOUNTER — Other Ambulatory Visit: Payer: Self-pay

## 2018-10-06 DIAGNOSIS — R928 Other abnormal and inconclusive findings on diagnostic imaging of breast: Secondary | ICD-10-CM | POA: Diagnosis not present

## 2018-10-06 DIAGNOSIS — N6489 Other specified disorders of breast: Secondary | ICD-10-CM

## 2018-10-09 ENCOUNTER — Encounter: Payer: Self-pay | Admitting: *Deleted

## 2018-10-09 ENCOUNTER — Telehealth: Payer: Self-pay | Admitting: Gastroenterology

## 2018-10-09 NOTE — Telephone Encounter (Signed)
Gastroenterology Pre-Procedure Review  Request Date: Thursday 10/19/2018   [facility] Requesting Physician: Dr. Allen Norris   PATIENT REVIEW QUESTIONS: The patient responded to the following health history questions as indicated:    1. Are you having any GI issues? no 2. Do you have a personal history of Polyps? no 3. Do you have a family history of Colon Cancer or Polyps? no 4. Diabetes Mellitus? no 5. Joint replacements in the past 12 months?no 6. Major health problems in the past 3 months?no 7. Any artificial heart valves, MVP, or defibrillator?no    MEDICATIONS & ALLERGIES:    Patient reports the following regarding taking any anticoagulation/antiplatelet therapy:   Plavix, Coumadin, Eliquis, Xarelto, Lovenox, Pradaxa, Brilinta, or Effient? no Aspirin? no  Patient confirms/reports the following medications:  Current Outpatient Medications  Medication Sig Dispense Refill  . acyclovir (ZOVIRAX) 400 MG tablet TAKE 1 TABLET (400 MG TOTAL) BY MOUTH 2 (TWO) TIMES DAILY. FOR HERPES PREVENTION. 180 tablet 0  . aspirin-acetaminophen-caffeine (EXCEDRIN MIGRAINE) 250-250-65 MG tablet Take 1 tablet by mouth every 6 (six) hours as needed for headache.    . DULoxetine (CYMBALTA) 30 MG capsule Take 2 capsules (60 mg total) by mouth daily. 180 capsule 0  . propranolol ER (INDERAL LA) 80 MG 24 hr capsule Take 2 capsules (160 mg total) by mouth daily. For headache prevention. 180 capsule 1  . SUMAtriptan (IMITREX) 50 MG tablet Take 1 tablet by mouth at migraine onset. May repeat in 2 hours if headache persists or recurs. Do not exceed 2 tablets in 24 hours. 10 tablet 0   No current facility-administered medications for this visit.     Patient confirms/reports the following allergies:  Allergies  Allergen Reactions  . Azithromycin Nausea And Vomiting  . Erythromycin Nausea And Vomiting  . Prednisone Other (See Comments)    Passes out  syncope    No orders of the defined types were placed in this  encounter.   AUTHORIZATION INFORMATION Primary Insurance: 1D#: Group #:  Secondary Insurance: 1D#: Group #:  SCHEDULE INFORMATION: Date:  Time: Location:

## 2018-10-10 ENCOUNTER — Other Ambulatory Visit: Payer: Self-pay

## 2018-10-10 DIAGNOSIS — Z1211 Encounter for screening for malignant neoplasm of colon: Secondary | ICD-10-CM

## 2018-10-10 MED ORDER — NA SULFATE-K SULFATE-MG SULF 17.5-3.13-1.6 GM/177ML PO SOLN: 1.0000 | Freq: Once | ORAL | 0 refills | Status: AC

## 2018-10-11 ENCOUNTER — Ambulatory Visit: Payer: BC Managed Care – PPO | Admitting: Primary Care

## 2018-10-15 ENCOUNTER — Other Ambulatory Visit: Payer: Self-pay | Admitting: Primary Care

## 2018-10-15 DIAGNOSIS — IMO0002 Reserved for concepts with insufficient information to code with codable children: Secondary | ICD-10-CM

## 2018-10-15 DIAGNOSIS — G43709 Chronic migraine without aura, not intractable, without status migrainosus: Secondary | ICD-10-CM

## 2018-10-16 ENCOUNTER — Encounter: Payer: Self-pay | Admitting: *Deleted

## 2018-10-16 ENCOUNTER — Other Ambulatory Visit: Payer: Self-pay

## 2018-10-16 ENCOUNTER — Other Ambulatory Visit
Admission: RE | Admit: 2018-10-16 | Discharge: 2018-10-16 | Disposition: A | Discharge status: Home / Self Care | Payer: BC Managed Care – PPO | Source: Ambulatory Visit | Attending: Gastroenterology | Admitting: Gastroenterology

## 2018-10-16 DIAGNOSIS — Z01812 Encounter for preprocedural laboratory examination: Secondary | ICD-10-CM | POA: Insufficient documentation

## 2018-10-16 DIAGNOSIS — K635 Polyp of colon: Secondary | ICD-10-CM | POA: Diagnosis not present

## 2018-10-16 DIAGNOSIS — Z20828 Contact with and (suspected) exposure to other viral communicable diseases: Secondary | ICD-10-CM | POA: Insufficient documentation

## 2018-10-16 NOTE — Telephone Encounter (Signed)
Last prescribed on 09/22/2018. Last appointment on 09/22/2018. No future appointment

## 2018-10-16 NOTE — Telephone Encounter (Signed)
She shouldn't need a refill as this is an as needed medication. Please find out. Thanks.

## 2018-10-17 LAB — SARS CORONAVIRUS 2 (TAT 6-24 HRS): SARS Coronavirus 2: NEGATIVE

## 2018-10-17 NOTE — Anesthesia Preprocedure Evaluation (Addendum)
Anesthesia Evaluation  Patient identified by MRN, date of birth, ID band  History of Anesthesia Complications (+) PONV and history of anesthetic complications (PONV x1 with GETA, none after last colonoscopy)  Airway Mallampati: II  TM Distance: >3 FB Neck ROM: Full    Dental  (+)    Pulmonary Current Smoker (1/2 ppd, smoked this AM)Patient did not abstain from smoking.,    breath sounds clear to auscultation       Cardiovascular  Rhythm:Regular Rate:Normal     Neuro/Psych  Headaches, PSYCHIATRIC DISORDERS Anxiety Depression    GI/Hepatic GERD  Controlled,  Endo/Other  diabetes (Pre-DM)  Renal/GU      Musculoskeletal  (+) Arthritis ,   Abdominal (+) + obese (BMI 36),   Peds  Hematology   Anesthesia Other Findings   Reproductive/Obstetrics                            Anesthesia Physical Anesthesia Plan  ASA: III  Anesthesia Plan: General   Post-op Pain Management:    Induction: Intravenous  PONV Risk Score and Plan: 3 and TIVA  Airway Management Planned: Natural Airway  Additional Equipment:   Intra-op Plan:   Post-operative Plan:   Informed Consent: I have reviewed the patients History and Physical, chart, labs and discussed the procedure including the risks, benefits and alternatives for the proposed anesthesia with the patient or authorized representative who has indicated his/her understanding and acceptance.       Plan Discussed with: CRNA and Anesthesiologist  Anesthesia Plan Comments:        Anesthesia Quick Evaluation    Active Ambulatory Problems    Diagnosis Date Noted  . Chronic migraine 09/22/2016  . Chronic neck pain 09/22/2016  . Anxiety and depression 09/22/2016  . Preventative health care 09/22/2016  . Genital herpes 09/19/2017  . Frequent headaches 09/19/2017  . Stress incontinence of urine 09/19/2017  . Prediabetes 09/19/2017  . Menopausal  symptoms 09/19/2017   Resolved Ambulatory Problems    Diagnosis Date Noted  . Acute respiratory infection 12/28/2017  . Post-viral cough syndrome 05/16/2018   Past Medical History:  Diagnosis Date  . Arthritis   . Chickenpox   . Complication of anesthesia   . Depression   . GERD (gastroesophageal reflux disease)   . Migraines   . PONV (postoperative nausea and vomiting)     CBC    Component Value Date/Time   WBC 8.8 10/08/2017 1020   RBC 4.92 10/08/2017 1020   HGB 15.2 10/08/2017 1020   HCT 44.3 10/08/2017 1020   PLT 403 10/08/2017 1020   MCV 90.0 10/08/2017 1020   MCH 30.9 10/08/2017 1020   MCHC 34.4 10/08/2017 1020   RDW 13.8 10/08/2017 1020   LYMPHSABS 2.4 10/08/2017 1020   MONOABS 0.6 10/08/2017 1020   EOSABS 0.3 10/08/2017 1020   BASOSABS 0.1 10/08/2017 1020    CMP     Component Value Date/Time   NA 138 09/22/2018 0842   K 4.2 09/22/2018 0842   CL 103 09/22/2018 0842   CO2 26 09/22/2018 0842   GLUCOSE 105 (H) 09/22/2018 0842   BUN 10 09/22/2018 0842   CREATININE 0.85 09/22/2018 0842   CALCIUM 9.7 09/22/2018 0842   PROT 6.9 09/22/2018 0842   ALBUMIN 4.4 09/22/2018 0842   AST 13 09/22/2018 0842   ALT 16 09/22/2018 0842   ALKPHOS 51 09/22/2018 0842   BILITOT 0.4 09/22/2018 0842   GFRNONAA >60 10/08/2017  [place] 10/08/2017 1020    COAGS No results found for: INR, PTT  I have seen and consented the patient, Brittany Pham. I have answered all of her questions regarding anesthesia. she is appropriately NPO.   Josephina Shih, MD Anesthesia

## 2018-10-18 NOTE — Discharge Instructions (Signed)

## 2018-10-19 ENCOUNTER — Ambulatory Visit: Payer: BC Managed Care – PPO | Admitting: Anesthesiology

## 2018-10-19 ENCOUNTER — Ambulatory Visit
Admission: RE | Admit: 2018-10-19 | Discharge: 2018-10-19 | Disposition: A | Discharge status: Home / Self Care | Payer: BC Managed Care – PPO | Attending: Gastroenterology | Admitting: Gastroenterology

## 2018-10-19 ENCOUNTER — Encounter
Admission: RE | Disposition: A | Discharge status: Home / Self Care | Payer: Self-pay | Source: Home / Self Care | Attending: Gastroenterology

## 2018-10-19 ENCOUNTER — Other Ambulatory Visit: Payer: Self-pay

## 2018-10-19 DIAGNOSIS — K219 Gastro-esophageal reflux disease without esophagitis: Secondary | ICD-10-CM | POA: Diagnosis not present

## 2018-10-19 DIAGNOSIS — K635 Polyp of colon: Secondary | ICD-10-CM | POA: Diagnosis not present

## 2018-10-19 DIAGNOSIS — K621 Rectal polyp: Secondary | ICD-10-CM | POA: Diagnosis not present

## 2018-10-19 DIAGNOSIS — Z1211 Encounter for screening for malignant neoplasm of colon: Secondary | ICD-10-CM | POA: Insufficient documentation

## 2018-10-19 DIAGNOSIS — F17210 Nicotine dependence, cigarettes, uncomplicated: Secondary | ICD-10-CM | POA: Insufficient documentation

## 2018-10-19 DIAGNOSIS — Z803 Family history of malignant neoplasm of breast: Secondary | ICD-10-CM | POA: Diagnosis not present

## 2018-10-19 DIAGNOSIS — M1990 Unspecified osteoarthritis, unspecified site: Secondary | ICD-10-CM | POA: Diagnosis not present

## 2018-10-19 DIAGNOSIS — E669 Obesity, unspecified: Secondary | ICD-10-CM | POA: Insufficient documentation

## 2018-10-19 DIAGNOSIS — Z6836 Body mass index (BMI) 36.0-36.9, adult: Secondary | ICD-10-CM | POA: Insufficient documentation

## 2018-10-19 DIAGNOSIS — D122 Benign neoplasm of ascending colon: Secondary | ICD-10-CM | POA: Diagnosis not present

## 2018-10-19 DIAGNOSIS — Z8249 Family history of ischemic heart disease and other diseases of the circulatory system: Secondary | ICD-10-CM | POA: Diagnosis not present

## 2018-10-19 DIAGNOSIS — K5730 Diverticulosis of large intestine without perforation or abscess without bleeding: Secondary | ICD-10-CM | POA: Insufficient documentation

## 2018-10-19 DIAGNOSIS — F329 Major depressive disorder, single episode, unspecified: Secondary | ICD-10-CM | POA: Insufficient documentation

## 2018-10-19 DIAGNOSIS — Z79899 Other long term (current) drug therapy: Secondary | ICD-10-CM | POA: Diagnosis not present

## 2018-10-19 DIAGNOSIS — G43909 Migraine, unspecified, not intractable, without status migrainosus: Secondary | ICD-10-CM | POA: Insufficient documentation

## 2018-10-19 DIAGNOSIS — D128 Benign neoplasm of rectum: Secondary | ICD-10-CM | POA: Insufficient documentation

## 2018-10-19 HISTORY — DX: Gastro-esophageal reflux disease without esophagitis: K21.9

## 2018-10-19 HISTORY — DX: Nausea with vomiting, unspecified: Z98.890

## 2018-10-19 HISTORY — PX: POLYPECTOMY: SHX5525

## 2018-10-19 HISTORY — DX: Nausea with vomiting, unspecified: R11.2

## 2018-10-19 HISTORY — PX: COLONOSCOPY WITH PROPOFOL: SHX5780

## 2018-10-19 HISTORY — DX: Other complications of anesthesia, initial encounter: T88.59XA

## 2018-10-19 SURGERY — COLONOSCOPY WITH PROPOFOL
Anesthesia: General | Site: Rectum

## 2018-10-19 MED ORDER — STERILE WATER FOR IRRIGATION IR SOLN
Status: DC | PRN
  Administered 2018-10-19: 30 mL

## 2018-10-19 MED ORDER — LACTATED RINGERS IV SOLN
INTRAVENOUS | Status: DC
  Administered 2018-10-19: 12:00:00 via INTRAVENOUS

## 2018-10-19 MED ORDER — SODIUM CHLORIDE 0.9 % IV SOLN: INTRAVENOUS | Status: DC

## 2018-10-19 MED ORDER — ACETAMINOPHEN 10 MG/ML IV SOLN: 1000.0000 mg | Freq: Once | INTRAVENOUS | Status: DC | PRN

## 2018-10-19 MED ORDER — LIDOCAINE HCL (CARDIAC) PF 100 MG/5ML IV SOSY
PREFILLED_SYRINGE | INTRAVENOUS | Status: DC | PRN
  Administered 2018-10-19: 30 mg via INTRAVENOUS

## 2018-10-19 MED ORDER — ONDANSETRON HCL 4 MG/2ML IJ SOLN: 4.0000 mg | Freq: Once | INTRAMUSCULAR | Status: DC | PRN

## 2018-10-19 MED ORDER — PROPOFOL 10 MG/ML IV BOLUS
INTRAVENOUS | Status: DC | PRN
  Administered 2018-10-19: 30 mg via INTRAVENOUS
  Administered 2018-10-19: 20 mg via INTRAVENOUS
  Administered 2018-10-19: 30 mg via INTRAVENOUS
  Administered 2018-10-19: 20 mg via INTRAVENOUS
  Administered 2018-10-19: 30 mg via INTRAVENOUS
  Administered 2018-10-19: 20 mg via INTRAVENOUS
  Administered 2018-10-19: 30 mg via INTRAVENOUS
  Administered 2018-10-19 (×2): 20 mg via INTRAVENOUS
  Administered 2018-10-19: 100 mg via INTRAVENOUS
  Administered 2018-10-19 (×4): 20 mg via INTRAVENOUS

## 2018-10-19 SURGICAL SUPPLY — 8 items
CANISTER SUCT 1200ML W/VALVE (MISCELLANEOUS) ×2 IMPLANT
FORCEPS BIOP RAD 4 LRG CAP 4 (CUTTING FORCEPS) ×1 IMPLANT
GOWN CVR UNV OPN BCK APRN NK (MISCELLANEOUS) ×2 IMPLANT
GOWN ISOL THUMB LOOP REG UNIV (MISCELLANEOUS) ×4
KIT ENDO PROCEDURE OLY (KITS) ×2 IMPLANT
SNARE SHORT THROW 13M SML OVAL (MISCELLANEOUS) ×1 IMPLANT
TRAP ETRAP POLY (MISCELLANEOUS) ×1 IMPLANT
WATER STERILE IRR 250ML POUR (IV SOLUTION) ×2 IMPLANT

## 2018-10-19 NOTE — Anesthesia Procedure Notes (Signed)
Date/Time: 10/19/2018 12:15 PM Performed by: Cameron Ali, CRNA Pre-anesthesia Checklist: Patient identified, Emergency Drugs available, Suction available, Timeout performed and Patient being monitored Patient Re-evaluated:Patient Re-evaluated prior to induction Oxygen Delivery Method: Nasal cannula Placement Confirmation: positive ETCO2

## 2018-10-19 NOTE — Transfer of Care (Signed)
Immediate Anesthesia Transfer of Care Note  Patient: Brittany Pham  Procedure(s) Performed: COLONOSCOPY WITH BIOPSY (N/A Rectum) POLYPECTOMY (N/A Rectum)  Patient Location: PACU  Anesthesia Type: General  Level of Consciousness: awake, alert  and patient cooperative  Airway and Oxygen Therapy: Patient Spontanous Breathing and Patient connected to supplemental oxygen  Post-op Assessment: Post-op Vital signs reviewed, Patient's Cardiovascular Status Stable, Respiratory Function Stable, Patent Airway and No signs of Nausea or vomiting  Post-op Vital Signs: Reviewed and stable  Complications: No apparent anesthesia complications

## 2018-10-19 NOTE — H&P (Signed)
Brittany Lame, MD [facility] [address], [place], [place] [address] Phone: [phone] Fax : [phone]  Primary Care Physician:  Brittany Koch, NP Primary Gastroenterologist:  Dr. Allen Norris  Pre-Procedure History & Physical: HPI:  Brittany Pham is a 53 y.o. female is here for a screening colonoscopy.   Past Medical History:  Diagnosis Date  . Arthritis   . Chickenpox   . Complication of anesthesia    nausea after septoplasty  . Depression   . GERD (gastroesophageal reflux disease)   . Migraines    2-3 per month  . PONV (postoperative nausea and vomiting)   . Post-viral cough syndrome 05/16/2018    Past Surgical History:  Procedure Laterality Date  . CERVICAL ABLATION  2007  . CERVICAL SPINE SURGERY  2014  . SEPTOPLASTY  1987    Prior to Admission medications   Medication Sig Start Date End Date Taking? Authorizing Provider  acyclovir (ZOVIRAX) 400 MG tablet TAKE 1 TABLET (400 MG TOTAL) BY MOUTH 2 (TWO) TIMES DAILY. FOR HERPES PREVENTION. 09/07/18  Yes Brittany Koch, NP  aspirin-acetaminophen-caffeine (EXCEDRIN MIGRAINE) [phone] MG tablet Take 1 tablet by mouth every 6 (six) hours as needed for headache.   Yes [provider]  DULoxetine (CYMBALTA) 30 MG capsule Take 2 capsules (60 mg total) by mouth daily. 09/12/18  Yes Brittany Koch, NP  propranolol ER (INDERAL LA) 80 MG 24 hr capsule Take 2 capsules (160 mg total) by mouth daily. For headache prevention. 09/22/18  Yes Brittany Koch, NP  SUMAtriptan (IMITREX) 50 MG tablet Take 1 tablet by mouth at migraine onset. May repeat in 2 hours if headache persists or recurs. Do not exceed 2 tablets in 24 hours. 09/22/18  Yes Brittany Koch, NP    Allergies as of 10/10/2018 - Review Complete 09/22/2018  Allergen Reaction Noted  . Azithromycin Nausea And Vomiting 10/01/2016  . Erythromycin Nausea And Vomiting 09/08/2014  . Prednisone Other (See Comments) 09/08/2014    Family History   Problem Relation Age of Onset  . Arthritis Mother   . Breast cancer Mother 63  . Heart disease Father   . Arthritis Maternal Grandfather   . Lung cancer Maternal Grandfather     Social History   Socioeconomic History  . Marital status: Married    Spouse name: Not on file  . Number of children: Not on file  . Years of education: Not on file  . Highest education level: Not on file  Occupational History  . Not on file  Social Needs  . Financial resource strain: Not on file  . Food insecurity    Worry: Not on file    Inability: Not on file  . Transportation needs    Medical: Not on file    Non-medical: Not on file  Tobacco Use  . Smoking status: Current Every Day Smoker    Packs/day: 1.00    Years: 19.00    Pack years: 19.00    Types: Cigarettes  . Smokeless tobacco: Never Used  Substance and Sexual Activity  . Alcohol use: No  . Drug use: No  . Sexual activity: Not on file  Lifestyle  . Physical activity    Days per week: Not on file    Minutes per session: Not on file  . Stress: Not on file  Relationships  . Social Herbalist on phone: Not on file    Gets together: Not on file    Attends  religious service: Not on file    Active member of club or organization: Not on file    Attends meetings of clubs or organizations: Not on file    Relationship status: Not on file  . Intimate partner violence    Fear of current or ex partner: Not on file    Emotionally abused: Not on file    Physically abused: Not on file    Forced sexual activity: Not on file  Other Topics Concern  . Not on file  Social History Narrative   Married.   2 children, 4 step children, 1 grandchild.    Works in [employer].    Enjoys swimming, spending time with family.     Review of Systems: See HPI, otherwise negative ROS  Physical Exam: BP 112/80   Pulse 86   Temp (!) 97.2 F (36.2 C) (Temporal)   Ht 5' 2.5" (1.588 m)   Wt 88.9 kg   SpO2 97%   BMI 35.28 kg/m  General:    Alert,  pleasant and cooperative in NAD Head:  Normocephalic and atraumatic. Neck:  Supple; no masses or thyromegaly. Lungs:  Clear throughout to auscultation.    Heart:  Regular rate and rhythm. Abdomen:  Soft, nontender and nondistended. Normal bowel sounds, without guarding, and without rebound.   Neurologic:  Alert and  oriented x4;  grossly normal neurologically.  Impression/Plan: [name] is now here to undergo a screening colonoscopy.  Risks, benefits, and alternatives regarding colonoscopy have been reviewed with the patient.  Questions have been answered.  All parties agreeable.

## 2018-10-19 NOTE — Op Note (Signed)
[facility] Gastroenterology Patient Name: Brittany Pham Procedure Date: 10/19/2018 12:03 PM MRN: [id number] Account #: [id number] Date of Birth: 04/27/1965 Admit Type: Outpatient Age: 53 Room: [facility] OR ROOM 01 Gender: Female Note Status: Finalized Procedure:            Colonoscopy Indications:          Screening for colorectal malignant neoplasm Providers:            [name] MD, MD Referring MD:         Pleas Koch (Referring MD) Medicines:            Propofol per Anesthesia Complications:        No immediate complications. Procedure:            Pre-Anesthesia Assessment:                       - Prior to the procedure, a History and Physical was                        performed, and patient medications and allergies were                        reviewed. The patient's tolerance of previous                        anesthesia was also reviewed. The risks and benefits of                        the procedure and the sedation options and risks were                        discussed with the patient. All questions were                        answered, and informed consent was obtained. Prior                        Anticoagulants: The patient has taken no previous                        anticoagulant or antiplatelet agents. ASA Grade                        Assessment: II - A patient with mild systemic disease.                        After reviewing the risks and benefits, the patient was                        deemed in satisfactory condition to undergo the                        procedure.                       After obtaining informed consent, the colonoscope was                        passed under direct vision. Throughout the procedure,  the patient's blood pressure, pulse, and oxygen                        saturations were monitored continuously. The was                        introduced through the anus and advanced to the the                  cecum, identified by appendiceal orifice and ileocecal                        valve. The colonoscopy was performed without                        difficulty. The patient tolerated the procedure well.                        The quality of the bowel preparation was excellent. Findings:      The perianal and digital rectal examinations were normal.      Two sessile polyps were found in the ascending colon. The polyps were 3       to 4 mm in size. These polyps were removed with a cold snare. Resection       and retrieval were complete.      A 2 mm polyp was found in the sigmoid colon. The polyp was sessile. The       polyp was removed with a cold biopsy forceps. Resection and retrieval       were complete.      A 4 mm polyp was found in the rectum. The polyp was semi-sessile. The       polyp was removed with a cold snare. Resection and retrieval were       complete.      Multiple small-mouthed diverticula were found in the sigmoid colon. Impression:           - Two 3 to 4 mm polyps in the ascending colon, removed                        with a cold snare. Resected and retrieved.                       - One 2 mm polyp in the sigmoid colon, removed with a                        cold biopsy forceps. Resected and retrieved.                       - One 4 mm polyp in the rectum, removed with a cold                        snare. Resected and retrieved.                       - Diverticulosis in the sigmoid colon. Recommendation:       - Discharge patient to home.                       - Resume previous diet.                       -  Continue present medications.                       - Await pathology results.                       - Repeat colonoscopy in 5 years if polyp adenoma and 10                        years if hyperplastic Procedure Code(s):    --- Professional ---                       [phone], Colonoscopy, flexible; with removal of tumor(s),                        polyp(s), or  other lesion(s) by snare technique                       45380, 50, Colonoscopy, flexible; with biopsy, single                        or multiple Diagnosis Code(s):    --- Professional ---                       Z12.11, Encounter for screening for malignant neoplasm                        of colon                       K63.5, Polyp of colon                       K62.1, Rectal polyp CPT copyright 2019 American Medical Association. All rights reserved. The codes documented in this report are preliminary and upon coder review may  be revised to meet current compliance requirements. [name] MD, MD 10/19/2018 12:43:44 PM This report has been signed electronically. Number of Addenda: 0 Note Initiated On: 10/19/2018 12:03 PM Scope Withdrawal Time: 0 hours 10 minutes 34 seconds  Total Procedure Duration: 0 hours 13 minutes 40 seconds  Estimated Blood Loss: Estimated blood loss: none.      [facility]

## 2018-10-19 NOTE — Anesthesia Postprocedure Evaluation (Signed)
Anesthesia Post Note  Patient: ROCKELL FAULKS  Procedure(s) Performed: COLONOSCOPY WITH BIOPSY (N/A Rectum) POLYPECTOMY (N/A Rectum)  Patient location during evaluation: PACU Anesthesia Type: General Level of consciousness: awake and alert Pain management: pain level controlled Vital Signs Assessment: post-procedure vital signs reviewed and stable Respiratory status: spontaneous breathing, nonlabored ventilation, respiratory function stable and patient connected to nasal cannula oxygen Cardiovascular status: blood pressure returned to baseline and stable Postop Assessment: no apparent nausea or vomiting Anesthetic complications: no    Cowen Pesqueira A  Korben Carcione

## 2018-10-20 ENCOUNTER — Encounter: Payer: Self-pay | Admitting: Gastroenterology

## 2018-10-25 ENCOUNTER — Encounter: Payer: Self-pay | Admitting: Gastroenterology

## 2018-11-02 ENCOUNTER — Other Ambulatory Visit: Payer: Self-pay

## 2018-11-02 ENCOUNTER — Other Ambulatory Visit: Payer: Self-pay | Admitting: Primary Care

## 2018-11-02 DIAGNOSIS — F329 Major depressive disorder, single episode, unspecified: Secondary | ICD-10-CM

## 2018-11-02 DIAGNOSIS — F419 Anxiety disorder, unspecified: Secondary | ICD-10-CM

## 2018-11-02 DIAGNOSIS — F32A Depression, unspecified: Secondary | ICD-10-CM

## 2018-11-02 MED ORDER — DULOXETINE HCL 30 MG PO CPEP: 60.0000 mg | ORAL_CAPSULE | Freq: Every day | ORAL | 1 refills | Status: DC

## 2018-11-24 ENCOUNTER — Other Ambulatory Visit: Payer: Self-pay | Admitting: Primary Care

## 2018-11-24 DIAGNOSIS — IMO0002 Reserved for concepts with insufficient information to code with codable children: Secondary | ICD-10-CM

## 2018-11-24 DIAGNOSIS — G43709 Chronic migraine without aura, not intractable, without status migrainosus: Secondary | ICD-10-CM

## 2018-11-28 ENCOUNTER — Other Ambulatory Visit: Payer: Self-pay

## 2018-11-28 DIAGNOSIS — IMO0002 Reserved for concepts with insufficient information to code with codable children: Secondary | ICD-10-CM

## 2018-11-28 DIAGNOSIS — G43709 Chronic migraine without aura, not intractable, without status migrainosus: Secondary | ICD-10-CM

## 2018-11-28 NOTE — Telephone Encounter (Signed)
How's she doing since we increased her propranolol to 2 capsules daily? Are her headaches and migraines less frequent? How many migraines per month?

## 2018-11-28 NOTE — Telephone Encounter (Signed)
See other refill request on this medication already from the pharmacy. Thank you

## 2018-11-28 NOTE — Telephone Encounter (Signed)
Last filled on 09/22/2018 #10 with 0 refill. LOV 09/22/2018 for CPE No future appointments

## 2018-11-29 MED ORDER — SUMATRIPTAN SUCCINATE 50 MG PO TABS: ORAL_TABLET | ORAL | 0 refills | Status: DC

## 2018-11-29 NOTE — Telephone Encounter (Signed)
Message left for patient to return my call. Also sent a message thru [place]

## 2018-11-29 NOTE — Telephone Encounter (Signed)
See my chart message

## 2018-12-26 ENCOUNTER — Other Ambulatory Visit: Payer: Self-pay | Admitting: Primary Care

## 2018-12-26 DIAGNOSIS — G43709 Chronic migraine without aura, not intractable, without status migrainosus: Secondary | ICD-10-CM

## 2018-12-26 DIAGNOSIS — IMO0002 Reserved for concepts with insufficient information to code with codable children: Secondary | ICD-10-CM

## 2019-01-08 ENCOUNTER — Ambulatory Visit (INDEPENDENT_AMBULATORY_CARE_PROVIDER_SITE_OTHER)
Admission: RE | Admit: 2019-01-08 | Discharge: 2019-01-08 | Disposition: A | Discharge status: Home / Self Care | Payer: BC Managed Care – PPO | Source: Ambulatory Visit

## 2019-01-08 DIAGNOSIS — J069 Acute upper respiratory infection, unspecified: Secondary | ICD-10-CM | POA: Diagnosis not present

## 2019-01-08 MED ORDER — GUAIFENESIN ER 600 MG PO TB12: 1200.0000 mg | ORAL_TABLET | Freq: Two times a day (BID) | ORAL | 0 refills | Status: DC | PRN

## 2019-01-08 MED ORDER — FLUTICASONE PROPIONATE 50 MCG/ACT NA SUSP: 1.0000 | Freq: Every day | NASAL | 0 refills | Status: DC

## 2019-01-08 MED ORDER — IBUPROFEN 800 MG PO TABS: 800.0000 mg | ORAL_TABLET | Freq: Three times a day (TID) | ORAL | 0 refills | Status: DC | PRN

## 2019-01-08 NOTE — ED Provider Notes (Signed)
Virtual Visit via Video Note:  Brittany Pham  initiated request for Telemedicine visit with [facility] team. I connected with Brittany Pham  on 01/08/2019 at 10:42 AM  for a synchronized telemedicine visit using a video enabled HIPPA compliant telemedicine application. I verified that I am speaking with Brittany Pham  using two identifiers. Sharion Balloon, NP  was physically located in a [facility] Urgent care site and Brittany Pham was located at a different location.   The limitations of evaluation and management by telemedicine as well as the availability of in-person appointments were discussed. Patient was informed that she  may incur a bill ( including co-pay) for this virtual visit encounter. Brittany Pham  expressed understanding and gave verbal consent to proceed with virtual visit.     History of Present Illness:Brittany Pham  is a 53 y.o. female presents for evaluation of 1 day history of sinus congestion and nonproductive cough.  She states this is similar to previous yearly episodes of bronchitis.  She denies fever, chills, sore throat, vomiting, diarrhea, rash, or other symptoms.  Treatment attempted at home with albuterol inhaler with moderate relief.   Allergies  Allergen Reactions  . Azithromycin Nausea And Vomiting  . Erythromycin Nausea And Vomiting  . Prednisone Other (See Comments)    Passes out  syncope     Past Medical History:  Diagnosis Date  . Arthritis   . Chickenpox   . Complication of anesthesia    nausea after septoplasty  . Depression   . GERD (gastroesophageal reflux disease)   . Migraines    2-3 per month  . PONV (postoperative nausea and vomiting)   . Post-viral cough syndrome 05/16/2018     Social History   Tobacco Use  . Smoking status: Current Every Day Smoker    Packs/day: 1.00    Years: 19.00    Pack years: 19.00    Types: Cigarettes  . Smokeless tobacco: Never Used  Substance Use Topics  . Alcohol use: No  .  Drug use: No        Observations/Objective: Physical Exam  VITALS: Patient denies fever. GENERAL: Alert, appears well and in no acute distress. HEENT: Atraumatic. NECK: Normal movements of the head and neck. CARDIOPULMONARY: No increased WOB. Speaking in clear sentences. I:E ratio WNL.  MS: Moves all visible extremities without noticeable abnormality. PSYCH: Pleasant and cooperative, well-groomed. Speech normal rate and rhythm. Affect is appropriate. Insight and judgement are appropriate. Attention is focused, linear, and appropriate.  NEURO: CN grossly intact. Oriented as arrived to appointment on time with no prompting. Moves both UE equally.     Assessment and Plan:    ICD-10-CM   1. Upper respiratory tract infection, unspecified type  J06.9        Follow Up Instructions: Treating with Mucinex, Flonase, ibuprofen.  Instructed patient to come here to be seen in person or follow-up with her PCP if her symptoms or not improving with these medications.  Patient agrees to plan of care.      I discussed the assessment and treatment plan with the patient. The patient was provided an opportunity to ask questions and all were answered. The patient agreed with the plan and demonstrated an understanding of the instructions.   The patient was advised to call back or seek an in-person evaluation if the symptoms worsen or if the condition fails to improve as anticipated.      Sharion Balloon,  NP  01/08/2019 10:42 AM         Sharion Balloon, NP 01/08/19 1043

## 2019-01-08 NOTE — Discharge Instructions (Addendum)
Take the Mucinex, Flonase nasal spray, and ibuprofen as directed.    Come to be seen in person or follow-up with your primary care provider in person if your symptoms are not improving.

## 2019-01-23 ENCOUNTER — Ambulatory Visit (INDEPENDENT_AMBULATORY_CARE_PROVIDER_SITE_OTHER): Payer: BC Managed Care – PPO

## 2019-01-23 ENCOUNTER — Other Ambulatory Visit: Payer: Self-pay

## 2019-01-23 ENCOUNTER — Ambulatory Visit
Admission: EM | Admit: 2019-01-23 | Discharge: 2019-01-23 | Disposition: A | Discharge status: Home / Self Care | Payer: BC Managed Care – PPO | Attending: Family Medicine | Admitting: Family Medicine

## 2019-01-23 DIAGNOSIS — R05 Cough: Secondary | ICD-10-CM

## 2019-01-23 DIAGNOSIS — J40 Bronchitis, not specified as acute or chronic: Secondary | ICD-10-CM | POA: Diagnosis not present

## 2019-01-23 DIAGNOSIS — Z20828 Contact with and (suspected) exposure to other viral communicable diseases: Secondary | ICD-10-CM | POA: Diagnosis not present

## 2019-01-23 DIAGNOSIS — F17210 Nicotine dependence, cigarettes, uncomplicated: Secondary | ICD-10-CM

## 2019-01-23 LAB — SARS CORONAVIRUS 2 AG (30 MIN TAT): SARS Coronavirus 2 Ag: NEGATIVE

## 2019-01-23 MED ORDER — DOXYCYCLINE HYCLATE 100 MG PO CAPS: 100.0000 mg | ORAL_CAPSULE | Freq: Two times a day (BID) | ORAL | 0 refills | Status: DC

## 2019-01-23 MED ORDER — BENZONATATE 200 MG PO CAPS: 200.0000 mg | ORAL_CAPSULE | Freq: Three times a day (TID) | ORAL | 0 refills | Status: DC | PRN

## 2019-01-23 NOTE — Discharge Instructions (Signed)
Medications as prescribed. ° °Take care ° °Dr. Aceson Labell  °

## 2019-01-23 NOTE — ED Provider Notes (Signed)
[facility]    CSN: [id number] Arrival date & time: 01/23/19  1848  History   Chief Complaint Chief Complaint  Patient presents with  . Cough   HPI  53 year old female presents with cough.  2 week history of cough. Cough is productive. No fever. She states that her boss was here today and tested positive for COVID-19. No SOB. No known exacerbating or relieving factors. No other associated symptoms. No other complaints.  PMH, Surgical Hx, Family Hx, Social History reviewed and updated as below.  Past Medical History:  Diagnosis Date  . Arthritis   . Chickenpox   . Complication of anesthesia    nausea after septoplasty  . Depression   . GERD (gastroesophageal reflux disease)   . Migraines    2-3 per month  . PONV (postoperative nausea and vomiting)   . Post-viral cough syndrome 05/16/2018    Patient Active Problem List   Diagnosis Date Noted  . Encounter for screening colonoscopy   . Polyp of ascending colon   . Rectal polyp   . Genital herpes 09/19/2017  . Frequent headaches 09/19/2017  . Stress incontinence of urine 09/19/2017  . Prediabetes 09/19/2017  . Menopausal symptoms 09/19/2017  . Chronic migraine 09/22/2016  . Chronic neck pain 09/22/2016  . Anxiety and depression 09/22/2016  . Preventative health care 09/22/2016    Past Surgical History:  Procedure Laterality Date  . CERVICAL ABLATION  2007  . CERVICAL SPINE SURGERY  2014  . COLONOSCOPY WITH PROPOFOL N/A 10/19/2018   Procedure: COLONOSCOPY WITH BIOPSY;  Surgeon: [name], MD;  Location: [place];  Service: Endoscopy;  Laterality: N/A;  . POLYPECTOMY N/A 10/19/2018   Procedure: POLYPECTOMY;  Surgeon: [name], MD;  Location: [place];  Service: Endoscopy;  Laterality: N/A;  . SEPTOPLASTY  1987    OB History   No obstetric history on file.      Home Medications    Prior to Admission medications   Medication Sig Start Date End Date Taking? Authorizing  Provider  acyclovir (ZOVIRAX) 400 MG tablet TAKE 1 TABLET (400 MG TOTAL) BY MOUTH 2 (TWO) TIMES DAILY. FOR HERPES PREVENTION. 09/07/18  Yes Pleas Koch, NP  aspirin-acetaminophen-caffeine (EXCEDRIN MIGRAINE) [phone] MG tablet Take 1 tablet by mouth every 6 (six) hours as needed for headache.   Yes [provider]  DULoxetine (CYMBALTA) 30 MG capsule Take 2 capsules (60 mg total) by mouth daily. 11/02/18  Yes Pleas Koch, NP  fluticasone (FLONASE) 50 MCG/ACT nasal spray Place 1 spray into both nostrils daily. 01/08/19  Yes Sharion Balloon, NP  guaiFENesin (MUCINEX) 600 MG 12 hr tablet Take 2 tablets (1,200 mg total) by mouth 2 (two) times daily as needed. 01/08/19  Yes Sharion Balloon, NP  propranolol ER (INDERAL LA) 80 MG 24 hr capsule Take 2 capsules (160 mg total) by mouth daily. For headache prevention. 09/22/18  Yes Pleas Koch, NP  SUMAtriptan (IMITREX) 50 MG tablet Take 1 tablet by mouth at migraine onset. May repeat in 2 hours if headache persists or recurs. Do not exceed 2 tablets in 24 hours. 11/29/18  Yes Pleas Koch, NP  benzonatate (TESSALON) 200 MG capsule Take 1 capsule (200 mg total) by mouth 3 (three) times daily as needed for cough. 01/23/19   Coral Spikes, DO  doxycycline (VIBRAMYCIN) 100 MG capsule Take 1 capsule (100 mg total) by mouth 2 (two) times daily. 01/23/19   Coral Spikes,  DO    Family History Family History  Problem Relation Age of Onset  . Arthritis Mother   . Breast cancer Mother 71  . Heart disease Father   . Arthritis Maternal Grandfather   . Lung cancer Maternal Grandfather     Social History Social History   Tobacco Use  . Smoking status: Current Every Day Smoker    Packs/day: 1.00    Years: 19.00    Pack years: 19.00    Types: Cigarettes  . Smokeless tobacco: Never Used  Substance Use Topics  . Alcohol use: Yes    Comment: rare  . Drug use: No     Allergies   Azithromycin, Erythromycin, and Prednisone    Review of Systems Review of Systems  Constitutional: Negative.   Respiratory: Positive for cough.    Physical Exam Triage Vital Signs ED Triage Vitals  Enc Vitals Group     BP 01/23/19 1922 (!) 152/109     Pulse Rate 01/23/19 1922 (!) 113     Resp 01/23/19 1922 16     Temp 01/23/19 1922 98.8 F (37.1 C)     Temp Source 01/23/19 1922 Oral     SpO2 01/23/19 1922 100 %     Weight 01/23/19 1920 200 lb (90.7 kg)     Height 01/23/19 1920 5\' 3"  (1.6 m)     Head Circumference --      Peak Flow --      Pain Score 01/23/19 1920 0     Pain Loc --      Pain Edu? --      Excl. in [place]? --    Updated Vital Signs BP (!) 152/109 (BP Location: Right Arm)   Pulse (!) 113   Temp 98.8 F (37.1 C) (Oral)   Resp 16   Ht 5\' 3"  (1.6 m)   Wt 90.7 kg   SpO2 100%   BMI 35.43 kg/m   Visual Acuity Right Eye Distance:   Left Eye Distance:   Bilateral Distance:    Right Eye Near:   Left Eye Near:    Bilateral Near:     Physical Exam Constitutional:      General: She is not in acute distress.    Appearance: Normal appearance. She is obese. She is not ill-appearing.  HENT:     Head: Normocephalic and atraumatic.  Eyes:     General:        Right eye: No discharge.        Left eye: No discharge.     Conjunctiva/sclera: Conjunctivae normal.  Cardiovascular:     Rate and Rhythm: Regular rhythm. Tachycardia present.     Heart sounds: No murmur.  Pulmonary:     Effort: Pulmonary effort is normal.     Breath sounds: Normal breath sounds. No wheezing, rhonchi or rales.  Neurological:     Mental Status: She is alert.  Psychiatric:        Mood and Affect: Mood normal.        Behavior: Behavior normal.    UC Treatments / Results  Labs (all labs ordered are listed, but only abnormal results are displayed) Labs Reviewed  SARS CORONAVIRUS 2 AG (30 MIN TAT)    EKG   Radiology Dg Chest 2 View  Result Date: 01/23/2019 CLINICAL DATA:  Cough and congestion EXAM: CHEST - 2 VIEW  COMPARISON:  September 08, 2014 FINDINGS: The lungs are clear. The heart size and pulmonary vascularity are normal. No adenopathy period  there is postoperative change in the lower cervical region. IMPRESSION: No edema or consolidation.  No evident adenopathy. Electronically Signed   By: Lowella Grip III M.D.   On: 01/23/2019 19:47    Procedures Procedures (including critical care time)  Medications Ordered in UC Medications - No data to display  Initial Impression / Assessment and Plan / UC Course  I have reviewed the triage vital signs and the nursing notes.  Pertinent labs & imaging results that were available during my care of the patient were reviewed by me and considered in my medical decision making (see chart for details).    53 year old female presents with bronchitis. COVID testing negative. Chest xray negative. Given duration of symptoms, will treat with Doxy. Tessalon perles for cough.  Final Clinical Impressions(s) / UC Diagnoses   Final diagnoses:  Bronchitis     Discharge Instructions     Medications as prescribed.  Take care  Dr. Lacinda Axon    ED Prescriptions    Medication Sig Dispense Auth. Provider   doxycycline (VIBRAMYCIN) 100 MG capsule Take 1 capsule (100 mg total) by mouth 2 (two) times daily. 14 capsule Madline Oesterling G, DO   benzonatate (TESSALON) 200 MG capsule Take 1 capsule (200 mg total) by mouth 3 (three) times daily as needed for cough. 20 capsule Coral Spikes, DO     PDMP not reviewed this encounter.   Coral Spikes, DO 01/23/19 2250

## 2019-01-23 NOTE — ED Triage Notes (Signed)
Patient complains of cough and congestion, patient states that her boss was in here today and was positive. Patient states that she has been having cough and congestion x 2 weeks but cough is still lingering.

## 2019-01-26 ENCOUNTER — Ambulatory Visit
Admission: EM | Admit: 2019-01-26 | Discharge: 2019-01-26 | Disposition: A | Discharge status: Home / Self Care | Payer: BC Managed Care – PPO | Attending: Family Medicine | Admitting: Family Medicine

## 2019-01-26 ENCOUNTER — Other Ambulatory Visit: Payer: Self-pay

## 2019-01-26 ENCOUNTER — Encounter: Payer: Self-pay | Admitting: Emergency Medicine

## 2019-01-26 DIAGNOSIS — Z20828 Contact with and (suspected) exposure to other viral communicable diseases: Secondary | ICD-10-CM | POA: Diagnosis not present

## 2019-01-26 DIAGNOSIS — H1031 Unspecified acute conjunctivitis, right eye: Secondary | ICD-10-CM

## 2019-01-26 DIAGNOSIS — Z7189 Other specified counseling: Secondary | ICD-10-CM

## 2019-01-26 MED ORDER — POLYMYXIN B-TRIMETHOPRIM 10000-0.1 UNIT/ML-% OP SOLN: 1.0000 [drp] | Freq: Four times a day (QID) | OPHTHALMIC | 0 refills | Status: AC

## 2019-01-26 NOTE — Discharge Instructions (Signed)
Take medication as prescribed. Continue home antibiotic. Rest. Drink plenty of fluids. Monitor. Good hand hygiene.   Follow up with your primary care physician this week as needed. Return to Urgent care for new or worsening concerns.

## 2019-01-26 NOTE — ED Provider Notes (Signed)
[facility] ____________________________________________  Time seen: Approximately 2:37 PM  I have reviewed the triage vital signs and the nursing notes.   HISTORY  Chief Complaint Eye Problem   HPI Brittany Pham is a 53 y.o. female presenting for evaluation of right eye irritation and drainage present this morning.  Patient reports right eye has been somewhat itchy and had some greenish drainage this morning.  Denies foreign body sensation or injury or trauma.  States felt fine last night going to bed.  Denies vision changes, photophobia or light sensitivity.  Patient reports she is currently on doxycycline for a cough, and reports the cough has improved.  Reports Covid testing this past week was negative.  Patient expresses concern as she has been reexposed to COVID-19 at her work in the last few days.  Denies fevers.  Denies chest pain, shortness of breath.  Denies other complaints. Wears glasses.  Does not wear contacts.  Pleas Koch, NP : PCP   Past Medical History:  Diagnosis Date  . Arthritis   . Chickenpox   . Complication of anesthesia    nausea after septoplasty  . Depression   . GERD (gastroesophageal reflux disease)   . Migraines    2-3 per month  . PONV (postoperative nausea and vomiting)   . Post-viral cough syndrome 05/16/2018    Patient Active Problem List   Diagnosis Date Noted  . Encounter for screening colonoscopy   . Polyp of ascending colon   . Rectal polyp   . Genital herpes 09/19/2017  . Frequent headaches 09/19/2017  . Stress incontinence of urine 09/19/2017  . Prediabetes 09/19/2017  . Menopausal symptoms 09/19/2017  . Chronic migraine 09/22/2016  . Chronic neck pain 09/22/2016  . Anxiety and depression 09/22/2016  . Preventative health care 09/22/2016    Past Surgical History:  Procedure Laterality Date  . CERVICAL ABLATION  2007  . CERVICAL SPINE SURGERY  2014  . COLONOSCOPY WITH PROPOFOL N/A 10/19/2018   Procedure:  COLONOSCOPY WITH BIOPSY;  Surgeon: [name], MD;  Location: [place];  Service: Endoscopy;  Laterality: N/A;  . POLYPECTOMY N/A 10/19/2018   Procedure: POLYPECTOMY;  Surgeon: [name], MD;  Location: [place];  Service: Endoscopy;  Laterality: N/A;  . SEPTOPLASTY  1987     No current facility-administered medications for this encounter.   Current Outpatient Medications:  .  acyclovir (ZOVIRAX) 400 MG tablet, TAKE 1 TABLET (400 MG TOTAL) BY MOUTH 2 (TWO) TIMES DAILY. FOR HERPES PREVENTION., Disp: 180 tablet, Rfl: 0 .  aspirin-acetaminophen-caffeine (EXCEDRIN MIGRAINE) 250-250-65 MG tablet, Take 1 tablet by mouth every 6 (six) hours as needed for headache., Disp: , Rfl:  .  benzonatate (TESSALON) 200 MG capsule, Take 1 capsule (200 mg total) by mouth 3 (three) times daily as needed for cough., Disp: 20 capsule, Rfl: 0 .  doxycycline (VIBRAMYCIN) 100 MG capsule, Take 1 capsule (100 mg total) by mouth 2 (two) times daily., Disp: 14 capsule, Rfl: 0 .  DULoxetine (CYMBALTA) 30 MG capsule, Take 2 capsules (60 mg total) by mouth daily., Disp: 180 capsule, Rfl: 1 .  fluticasone (FLONASE) 50 MCG/ACT nasal spray, Place 1 spray into both nostrils daily., Disp: 16 g, Rfl: 0 .  guaiFENesin (MUCINEX) 600 MG 12 hr tablet, Take 2 tablets (1,200 mg total) by mouth 2 (two) times daily as needed., Disp: 14 tablet, Rfl: 0 .  propranolol ER (INDERAL LA) 80 MG 24 hr capsule, Take 2 capsules (160 mg total) by  mouth daily. For headache prevention., Disp: 180 capsule, Rfl: 1 .  SUMAtriptan (IMITREX) 50 MG tablet, Take 1 tablet by mouth at migraine onset. May repeat in 2 hours if headache persists or recurs. Do not exceed 2 tablets in 24 hours., Disp: 10 tablet, Rfl: 0 .  trimethoprim-polymyxin b (POLYTRIM) ophthalmic solution, Place 1 drop into the right eye every 6 (six) hours for 7 days., Disp: 10 mL, Rfl: 0  Allergies Azithromycin, Erythromycin, and Prednisone  Family History  Problem  Relation Age of Onset  . Arthritis Mother   . Breast cancer Mother 51  . Heart disease Father   . Arthritis Maternal Grandfather   . Lung cancer Maternal Grandfather     Social History Social History   Tobacco Use  . Smoking status: Current Every Day Smoker    Packs/day: 1.00    Years: 19.00    Pack years: 19.00    Types: Cigarettes  . Smokeless tobacco: Never Used  Substance Use Topics  . Alcohol use: Yes    Comment: rare  . Drug use: No    Review of Systems Constitutional: No fever Eyes: No visual changes. As above.  ENT: No sore throat. Cardiovascular: Denies chest pain. Respiratory: Denies shortness of breath. Gastrointestinal: No abdominal pain.  No nausea, no vomiting.  No diarrhea.  Musculoskeletal: Negative for back pain. Skin: Negative for rash.   ____________________________________________   PHYSICAL EXAM:  VITAL SIGNS: ED Triage Vitals  Enc Vitals Group     BP 01/26/19 1401 (!) 150/96     Pulse Rate 01/26/19 1401 97     Resp 01/26/19 1401 18     Temp 01/26/19 1401 98.3 F (36.8 C)     Temp Source 01/26/19 1401 Oral     SpO2 01/26/19 1401 97 %     Weight 01/26/19 1400 200 lb (90.7 kg)     Height 01/26/19 1400 5\' 3"  (1.6 m)     Head Circumference --      Peak Flow --      Pain Score 01/26/19 1359 0     Pain Loc --      Pain Edu? --      Excl. in [place]? --    Constitutional: Alert and oriented. Well appearing and in no acute distress. Eyes: No left conjunctival injection.  Mild right conjunctival injection.  No foreign bodies noted bilaterally.  No left eye drainage.  Right eye mild greenish discharge noted.  PERRL. EOMI. no pain with EOMs.  Concern tenderness, swelling or erythema bilaterally. ENT      Head: Normocephalic and atraumatic.      Nose: No congestion      Mouth/Throat: Mucous membranes are moist.Oropharynx non-erythematous.  No tonsillar swelling exudate. Neck: No stridor. Supple without meningismus.   Hematological/Lymphatic/Immunilogical: No cervical lymphadenopathy. Cardiovascular: Normal rate, regular rhythm. Grossly normal heart sounds.  Good peripheral circulation. Respiratory: Normal respiratory effort without tachypnea nor retractions. Breath sounds are clear and equal bilaterally. No wheezes, rales, rhonchi. Musculoskeletal: Steady gait. Neurologic:  Normal speech and language. No gait instability.  Skin:  Skin is warm, dry and intact. No rash noted. Psychiatric: Mood and affect are normal. Speech and behavior are normal. Patient exhibits appropriate insight and judgment   ___________________________________________   LABS (all labs ordered are listed, but only abnormal results are displayed)  Labs Reviewed  NOVEL CORONAVIRUS, NAA (HOSP ORDER, SEND-OUT TO REF LAB; TAT 18-24 HRS)   ____________________________________________   PROCEDURES Procedures   INITIAL IMPRESSION / ASSESSMENT  AND PLAN / ED COURSE  Pertinent labs & imaging results that were available during my care of the patient were reviewed by me and considered in my medical decision making (see chart for details).  Well-appearing patient.  No acute distress.  COVID-19 testing completed and advice given.  Suspect viral versus bacterial conjunctivitis, will start him on Polytrim.  Avoidance of rubbing, good hand hygiene and supportive care. Discussed indication, risks and benefits of medications with patient.   Discussed follow up with Primary care physician this week as needed. Discussed follow up and return parameters including no resolution or any worsening concerns. Patient verbalized understanding and agreed to plan.   ____________________________________________   FINAL CLINICAL IMPRESSION(S) / ED DIAGNOSES  Final diagnoses:  Acute conjunctivitis of right eye, unspecified acute conjunctivitis type  Advice given about COVID-19 virus infection     ED Discharge Orders         Ordered     trimethoprim-polymyxin b (POLYTRIM) ophthalmic solution  Every 6 hours     01/26/19 1439           Note: This dictation was prepared with Dragon dictation along with smaller phrase technology. Any transcriptional errors that result from this process are unintentional.         Marylene Land, NP 01/26/19 1519

## 2019-01-26 NOTE — ED Triage Notes (Signed)
Patient states she was here Tuesday for COVID testing. She was exposed Tuesday to someone else who tested positive for COVID. She woke up this morning and her right eye is itching and she is worried she has COVID.

## 2019-01-29 ENCOUNTER — Telehealth: Payer: BC Managed Care – PPO | Admitting: Primary Care

## 2019-01-29 LAB — NOVEL CORONAVIRUS, NAA (HOSP ORDER, SEND-OUT TO REF LAB; TAT 18-24 HRS): SARS-CoV-2, NAA: NOT DETECTED

## 2019-03-16 ENCOUNTER — Other Ambulatory Visit: Payer: Self-pay | Admitting: Primary Care

## 2019-03-16 DIAGNOSIS — G43709 Chronic migraine without aura, not intractable, without status migrainosus: Secondary | ICD-10-CM

## 2019-03-16 DIAGNOSIS — IMO0002 Reserved for concepts with insufficient information to code with codable children: Secondary | ICD-10-CM

## 2019-03-20 IMAGING — CR DG ABDOMEN 2V
4 series · 4 of 4 positions shown · non-contrast
Comparison: None.

CLINICAL DATA: Nausea, diarrhea

EXAM:
ABDOMEN - 2 VIEW

[abdomen erect (1 of 2)]
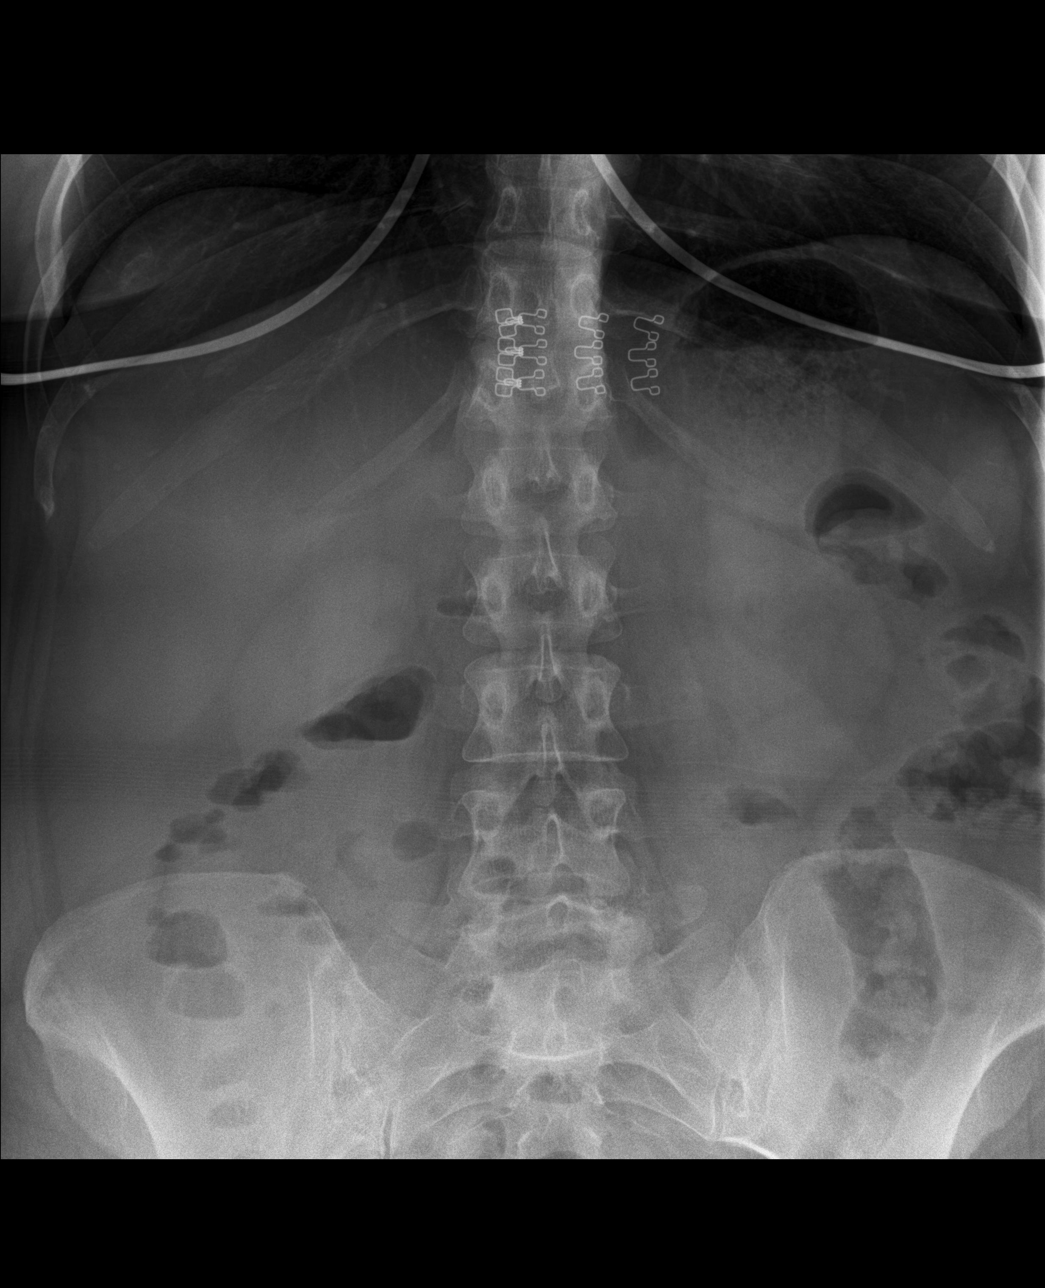

[abdomen erect (2 of 2)]
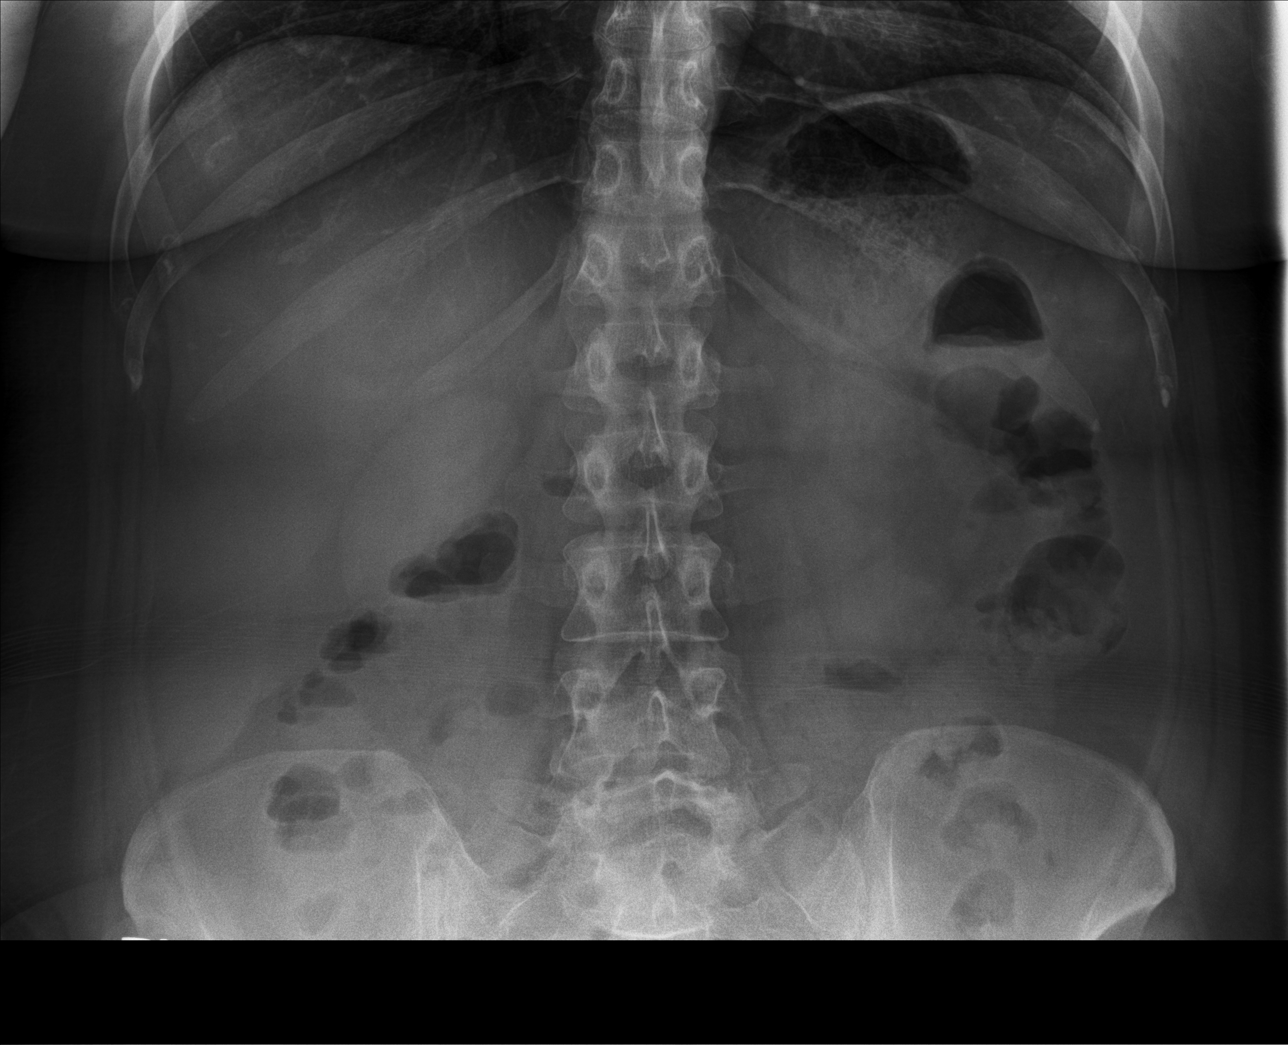

[abdomen supine (1 of 2)]
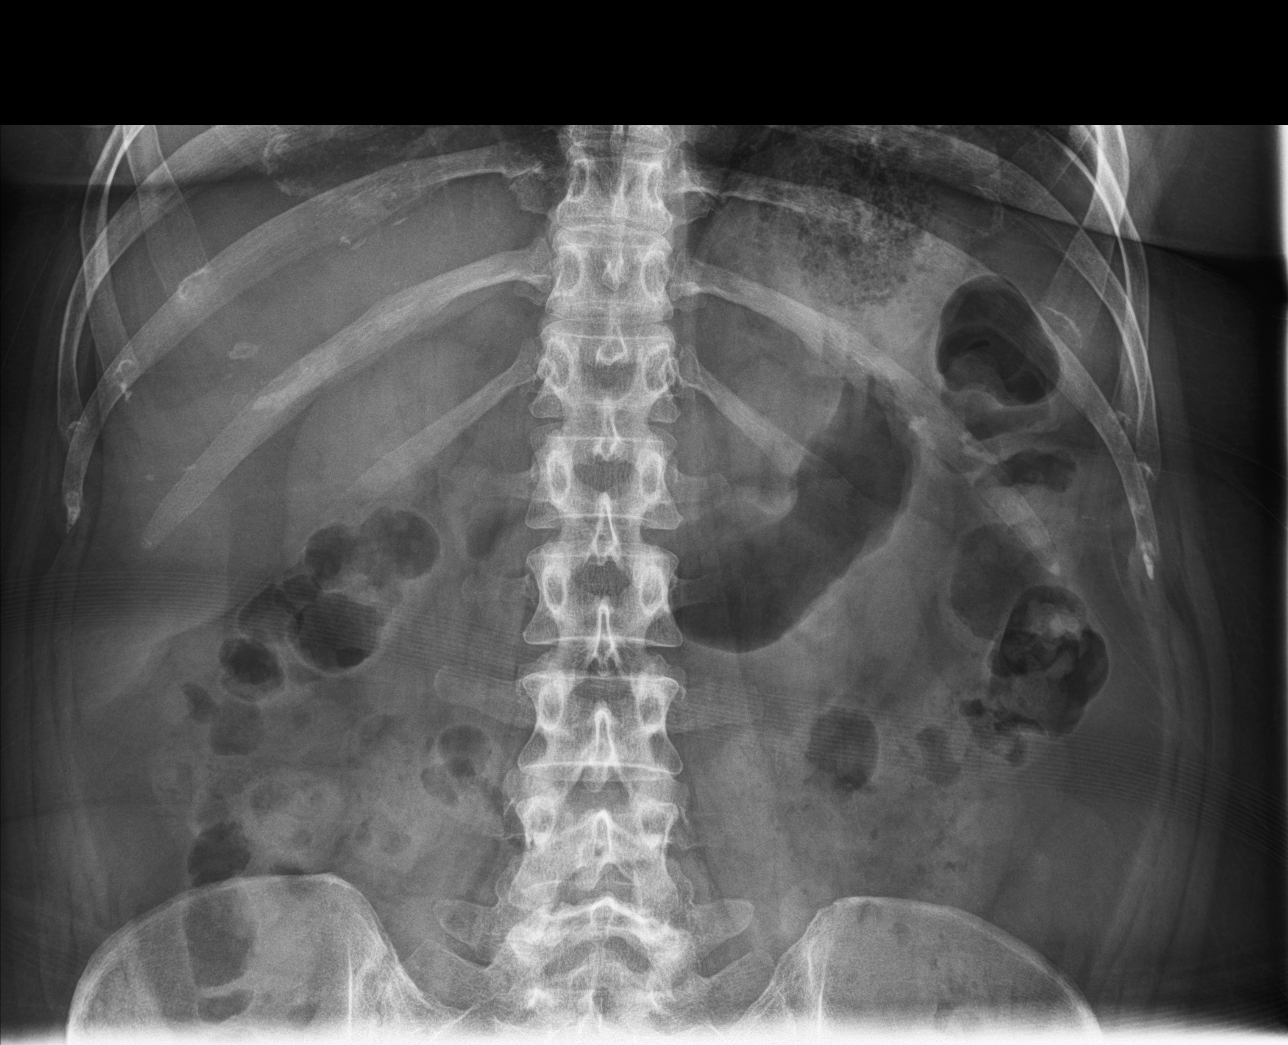

[abdomen supine (2 of 2)]
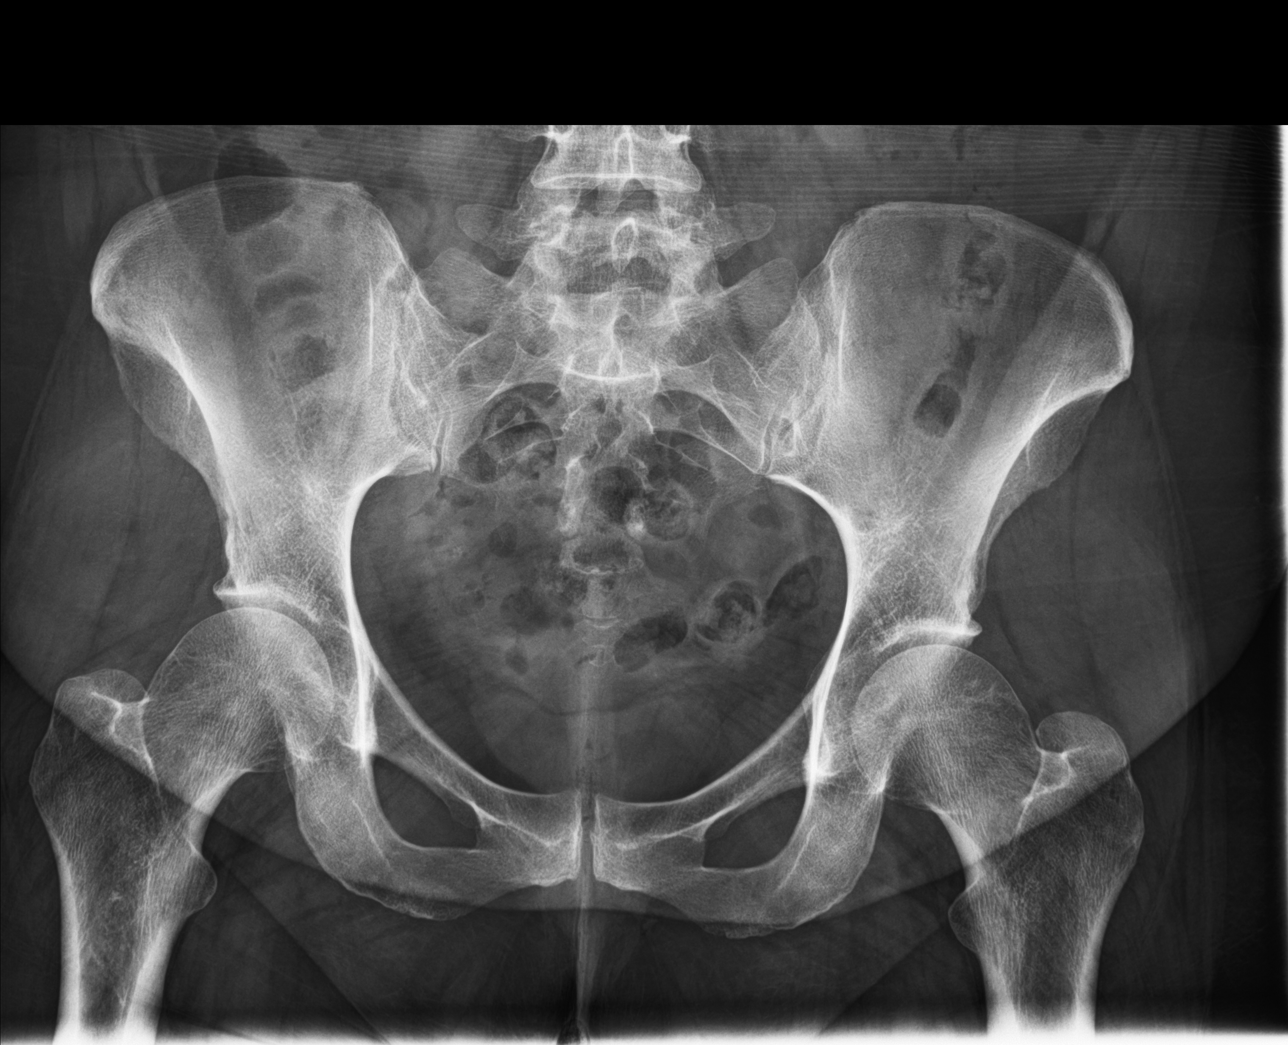

[4 of 4 positions shown; findings below may reference images not displayed]

FINDINGS: The bowel gas pattern is normal. Multiple colonic air-fluid as can
be seen with diarrhea. There is no evidence of free air. No
radio-opaque calculi or other significant radiographic abnormality
is seen.
IMPRESSION: Multiple colonic air-fluid as can be seen with diarrhea.

## 2019-04-22 ENCOUNTER — Other Ambulatory Visit: Payer: Self-pay | Admitting: Primary Care

## 2019-04-22 DIAGNOSIS — F329 Major depressive disorder, single episode, unspecified: Secondary | ICD-10-CM

## 2019-04-22 DIAGNOSIS — F419 Anxiety disorder, unspecified: Secondary | ICD-10-CM

## 2019-04-22 DIAGNOSIS — F32A Depression, unspecified: Secondary | ICD-10-CM

## 2019-05-01 ENCOUNTER — Other Ambulatory Visit: Payer: Self-pay

## 2019-05-01 ENCOUNTER — Ambulatory Visit: Payer: BC Managed Care – PPO | Admitting: Primary Care

## 2019-05-01 ENCOUNTER — Encounter: Payer: Self-pay | Admitting: Primary Care

## 2019-05-01 VITALS — BP 136/84 | HR 72 | Temp 97.4°F | Ht 63.0 in | Wt 205.2 lb

## 2019-05-01 DIAGNOSIS — K644 Residual hemorrhoidal skin tags: Secondary | ICD-10-CM

## 2019-05-01 DIAGNOSIS — R0683 Snoring: Secondary | ICD-10-CM

## 2019-05-01 DIAGNOSIS — G4733 Obstructive sleep apnea (adult) (pediatric): Secondary | ICD-10-CM | POA: Insufficient documentation

## 2019-05-01 DIAGNOSIS — G43709 Chronic migraine without aura, not intractable, without status migrainosus: Secondary | ICD-10-CM | POA: Diagnosis not present

## 2019-05-01 DIAGNOSIS — IMO0002 Reserved for concepts with insufficient information to code with codable children: Secondary | ICD-10-CM

## 2019-05-01 MED ORDER — SUMATRIPTAN SUCCINATE 50 MG PO TABS: ORAL_TABLET | ORAL | 0 refills | Status: DC

## 2019-05-01 NOTE — Patient Instructions (Signed)
You will be contacted regarding your referral to GI and pulmonology.  Please let us know if you have not been contacted within two weeks.   It was a pleasure to see you today!

## 2019-05-01 NOTE — Progress Notes (Signed)
Subjective:    Patient ID: Brittany Pham, female    DOB: 1966-01-12, 54 y.o.   MRN: [id number]  HPI  This visit occurred during the SARS-CoV-2 public health emergency.  Safety protocols were in place, including screening questions prior to the visit, additional usage of staff PPE, and extensive cleaning of exam room while observing appropriate contact time as indicated for disinfecting solutions.   Ms. Brittany Pham is a 54 year old female with a history of chronic migraines, polyps of colon, anxiety and depression, prediabetes, who presents today for medication refill and several issues.  1) Snoring: Chronic for years. She underwent a sleep study about eight years ago, negative for sleep apnea. Both of her children have noticed increased snoring and that her "breathing has changed". Several years ago her grandchild accidentally "popped" her in the nose, she is wondering if her nose is broken. She keeps her family up at night due to snoring.   She does wake at times during the night, she hasn't been told that she has periods of apnea. She does feel tired during the day at times, overall feeling less stressed due to a recent separation. She has more energy, feeling more motivated. She denies a family history of sleep apnea.  2) Hemorrhoid: Chronic and intermittent for 31 years, externally. Over the last 1-2 months she's noticed enlargement and increased pain. Some bleeding with bowel movements, not uncontrolled. Recently she's been using suppositories OTC without improvement.   BP Readings from Last 3 Encounters:  05/01/19 136/84  01/26/19 (!) 150/96  01/23/19 (!) 152/109     Review of Systems  Constitutional: Negative for fatigue.  Respiratory:       Snoring, see HPI  Gastrointestinal:       Hemorrhoid, see HPI       Past Medical History:  Diagnosis Date  . Arthritis   . Chickenpox   . Complication of anesthesia    nausea after septoplasty  . Depression   . GERD (gastroesophageal  reflux disease)   . Migraines    2-3 per month  . PONV (postoperative nausea and vomiting)   . Post-viral cough syndrome 05/16/2018     Social History   Socioeconomic History  . Marital status: Married    Spouse name: Not on file  . Number of children: Not on file  . Years of education: Not on file  . Highest education level: Not on file  Occupational History  . Not on file  Tobacco Use  . Smoking status: Light Tobacco Smoker    Packs/day: 1.00    Years: 19.00    Pack years: 19.00    Types: Cigarettes  . Smokeless tobacco: Never Used  Substance and Sexual Activity  . Alcohol use: Yes    Comment: rare  . Drug use: No  . Sexual activity: Not on file  Other Topics Concern  . Not on file  Social History Narrative   Married.   2 children, 4 step children, 1 grandchild.    Works in [employer].    Enjoys swimming, spending time with family.    Social Determinants of Health   Financial Resource Strain:   . Difficulty of Paying Living Expenses: Not on file  Food Insecurity:   . Worried About Charity fundraiser in the Last Year: Not on file  . Ran Out of Food in the Last Year: Not on file  Transportation Needs:   . Lack of Transportation (Medical): Not on file  . Lack  of Transportation (Non-Medical): Not on file  Physical Activity:   . Days of Exercise per Week: Not on file  . Minutes of Exercise per Session: Not on file  Stress:   . Feeling of Stress : Not on file  Social Connections:   . Frequency of Communication with Friends and Family: Not on file  . Frequency of Social Gatherings with Friends and Family: Not on file  . Attends Religious Services: Not on file  . Active Member of Clubs or Organizations: Not on file  . Attends Archivist Meetings: Not on file  . Marital Status: Not on file  Intimate Partner Violence:   . Fear of Current or Ex-Partner: Not on file  . Emotionally Abused: Not on file  . Physically Abused: Not on file  . Sexually Abused: Not  on file    Past Surgical History:  Procedure Laterality Date  . CERVICAL ABLATION  2007  . CERVICAL SPINE SURGERY  2014  . COLONOSCOPY WITH PROPOFOL N/A 10/19/2018   Procedure: COLONOSCOPY WITH BIOPSY;  Surgeon: [name], MD;  Location: [place];  Service: Endoscopy;  Laterality: N/A;  . POLYPECTOMY N/A 10/19/2018   Procedure: POLYPECTOMY;  Surgeon: [name], MD;  Location: [place];  Service: Endoscopy;  Laterality: N/A;  . SEPTOPLASTY  1987    Family History  Problem Relation Age of Onset  . Arthritis Mother   . Breast cancer Mother 4  . Heart disease Father   . Arthritis Maternal Grandfather   . Lung cancer Maternal Grandfather     Allergies  Allergen Reactions  . Azithromycin Nausea And Vomiting  . Erythromycin Nausea And Vomiting  . Prednisone Other (See Comments)    Passes out  syncope    Current Outpatient Medications on File Prior to Visit  Medication Sig Dispense Refill  . acyclovir (ZOVIRAX) 400 MG tablet TAKE 1 TABLET (400 MG TOTAL) BY MOUTH 2 (TWO) TIMES DAILY. FOR HERPES PREVENTION. 180 tablet 0  . aspirin-acetaminophen-caffeine (EXCEDRIN MIGRAINE) 250-250-65 MG tablet Take 1 tablet by mouth every 6 (six) hours as needed for headache.    . DULoxetine (CYMBALTA) 30 MG capsule TAKE 2 CAPSULES BY MOUTH EVERY DAY 180 capsule 0  . propranolol ER (INDERAL LA) 80 MG 24 hr capsule TAKE 2 CAPSULES (160 MG TOTAL) BY MOUTH DAILY. FOR HEADACHE PREVENTION. 180 capsule 1   No current facility-administered medications on file prior to visit.    BP 136/84   Pulse 72   Temp (!) 97.4 F (36.3 C) (Temporal)   Ht 5\' 3"  (1.6 m)   Wt 205 lb 4 oz (93.1 kg)   SpO2 97%   BMI 36.36 kg/m    Objective:   Physical Exam  Constitutional: She appears well-nourished.  Cardiovascular: Normal rate.  Respiratory: Effort normal.  GI:  External, non thrombosed hemorrhoid measuring about 1 cm.   Musculoskeletal:     Cervical back: Neck supple.  Skin:  Skin is warm and dry.  Psychiatric: She has a normal mood and affect.           Assessment & Plan:

## 2019-05-01 NOTE — Assessment & Plan Note (Signed)
Noted on exam, non thrombosed, no bleeding. Given no improvement with OTC treatment and gradual enlargement with discomfort, we will send her to GI for evaluation. Referral placed.

## 2019-05-01 NOTE — Assessment & Plan Note (Signed)
Improved since reduction of stress and with daily propranolol. Refill provided for sumatriptan.

## 2019-05-01 NOTE — Assessment & Plan Note (Signed)
Chronic for years, increased. Epworth Sleepiness Score of 13 today. Referral placed to pulmonology for sleep study.

## 2019-05-24 ENCOUNTER — Other Ambulatory Visit: Payer: Self-pay | Admitting: Primary Care

## 2019-05-24 DIAGNOSIS — K644 Residual hemorrhoidal skin tags: Secondary | ICD-10-CM

## 2019-05-31 ENCOUNTER — Ambulatory Visit (INDEPENDENT_AMBULATORY_CARE_PROVIDER_SITE_OTHER): Payer: BC Managed Care – PPO | Admitting: Gastroenterology

## 2019-05-31 ENCOUNTER — Other Ambulatory Visit: Payer: Self-pay

## 2019-05-31 ENCOUNTER — Encounter: Payer: Self-pay | Admitting: Gastroenterology

## 2019-05-31 VITALS — BP 141/95 | HR 97 | Temp 98.3°F | Ht 63.0 in | Wt 204.4 lb

## 2019-05-31 DIAGNOSIS — K641 Second degree hemorrhoids: Secondary | ICD-10-CM | POA: Diagnosis not present

## 2019-05-31 DIAGNOSIS — K5909 Other constipation: Secondary | ICD-10-CM

## 2019-05-31 NOTE — Progress Notes (Signed)
Cephas Darby, MD [address]  [place]  [place], [place] [address]  Main: [phone]  Fax: [phone]    Gastroenterology Consultation  Referring Provider:     Pleas Koch, NP Primary Care Physician:  Pleas Koch, NP Primary Gastroenterologist:  Dr. Cephas Darby Reason for Consultation:   Symptomatic hemorrhoids        HPI:   Brittany Pham is a 54 y.o. female referred by Dr. Carlis Abbott, Leticia Penna, NP  for consultation & management of symptomatic external hemorrhoids.  Patient was seen by PCP on 05/01/2019 for recent worsening of her hemorrhoidal symptoms including swelling, pain and bleeding which was self-limited.  She tried over-the-counter suppositories with not much relief.  She has the symptoms ongoing for 31 years, have gotten worse the last 1 to 2 months.  For last 2 months, she has made significant changes in her lifestyle, has been very active, exercising regularly, following a strict diet.  She feels good about herself, also her constipation has significantly improved.  She has struggled with irregular bowel habits since her childhood.  She reports having bowel movement daily.  She reports rectal bleeding is very minimal and her hemorrhoidal symptoms have significantly improved.  She is a chronic smoker, cut back from 1 pack/day to 1 pack per 2 weeks  NSAIDs: None  Antiplts/Anticoagulants/Anti thrombotics: None  GI Procedures:   Colonoscopy 10/19/2018 by Dr. Allen Norris - Two 3 to 4 mm polyps in the ascending colon, removed with a cold snare. Resected and retrieved. - One 2 mm polyp in the sigmoid colon, removed with a cold biopsy forceps. Resected and retrieved. - One 4 mm polyp in the rectum, removed with a cold snare. Resected and retrieved. - Diverticulosis in the sigmoid colon.    Past Medical History:  Diagnosis Date  . Arthritis   . Chickenpox   . Complication of anesthesia    nausea after septoplasty  . Depression   . GERD  (gastroesophageal reflux disease)   . Migraines    2-3 per month  . PONV (postoperative nausea and vomiting)   . Post-viral cough syndrome 05/16/2018    Past Surgical History:  Procedure Laterality Date  . CERVICAL ABLATION  2007  . CERVICAL SPINE SURGERY  2014  . COLONOSCOPY WITH PROPOFOL N/A 10/19/2018   Procedure: COLONOSCOPY WITH BIOPSY;  Surgeon: [name], MD;  Location: [place];  Service: Endoscopy;  Laterality: N/A;  . POLYPECTOMY N/A 10/19/2018   Procedure: POLYPECTOMY;  Surgeon: [name], MD;  Location: [place];  Service: Endoscopy;  Laterality: N/A;  . SEPTOPLASTY  1987    Current Outpatient Medications:  .  acyclovir (ZOVIRAX) 400 MG tablet, TAKE 1 TABLET (400 MG TOTAL) BY MOUTH 2 (TWO) TIMES DAILY. FOR HERPES PREVENTION., Disp: 180 tablet, Rfl: 0 .  aspirin-acetaminophen-caffeine (EXCEDRIN MIGRAINE) 250-250-65 MG tablet, Take 1 tablet by mouth every 6 (six) hours as needed for headache., Disp: , Rfl:  .  DULoxetine (CYMBALTA) 30 MG capsule, TAKE 2 CAPSULES BY MOUTH EVERY DAY, Disp: 180 capsule, Rfl: 0 .  propranolol ER (INDERAL LA) 80 MG 24 hr capsule, TAKE 2 CAPSULES (160 MG TOTAL) BY MOUTH DAILY. FOR HEADACHE PREVENTION., Disp: 180 capsule, Rfl: 1 .  SUMAtriptan (IMITREX) 50 MG tablet, Take 1 tablet by mouth at migraine onset. May repeat in 2 hours if headache persists or recurs. Do not exceed 2 tablets in 24 hours., Disp: 10 tablet, Rfl: 0   Family History  Problem  Relation Age of Onset  . Arthritis Mother   . Breast cancer Mother 78  . Heart disease Father   . Arthritis Maternal Grandfather   . Lung cancer Maternal Grandfather      Social History   Tobacco Use  . Smoking status: Light Tobacco Smoker    Packs/day: 1.00    Years: 19.00    Pack years: 19.00    Types: Cigarettes  . Smokeless tobacco: Never Used  Substance Use Topics  . Alcohol use: Yes    Comment: rare  . Drug use: No    Allergies as of 05/31/2019 - Review  Complete 05/31/2019  Allergen Reaction Noted  . Azithromycin Nausea And Vomiting 10/01/2016  . Erythromycin Nausea And Vomiting 09/08/2014  . Prednisone Other (See Comments) 09/08/2014    Review of Systems:    All systems reviewed and negative except where noted in HPI.   Physical Exam:  BP (!) 141/95   Pulse 97   Temp 98.3 F (36.8 C) (Temporal)   Ht 5\' 3"  (1.6 m)   Wt 204 lb 6.4 oz (92.7 kg)   BMI 36.21 kg/m  No LMP recorded. Patient is postmenopausal.  General:   Alert,  Well-developed, well-nourished, pleasant and cooperative in NAD Head:  Normocephalic and atraumatic. Eyes:  Sclera clear, no icterus.   Conjunctiva pink. Ears:  Normal auditory acuity. Nose:  No deformity, discharge, or lesions. Mouth:  No deformity or lesions,oropharynx pink & moist. Neck:  Supple; no masses or thyromegaly. Lungs:  Respirations even and unlabored.  Clear throughout to auscultation.   No wheezes, crackles, or rhonchi. No acute distress. Heart:  Regular rate and rhythm; no murmurs, clicks, rubs, or gallops. Abdomen:  Normal bowel sounds. Soft, non-tender and non-distended without masses, hepatosplenomegaly or hernias noted.  No guarding or rebound tenderness.   Rectal: Perianal skin tags, palpable external hemorrhoids, nontender, solid brown stool in the rectal vault Msk:  Symmetrical without gross deformities. Good, equal movement & strength bilaterally. Pulses:  Normal pulses noted. Extremities:  No clubbing or edema.  No cyanosis. Neurologic:  Alert and oriented x3;  grossly normal neurologically. Skin:  Intact without significant lesions or rashes. No jaundice. Lymph Nodes:  No significant cervical adenopathy. Psych:  Alert and cooperative. Normal mood and affect.  Imaging Studies: Ultrasound liver revealed fatty liver several years ago  Assessment and Plan:   Brittany Pham is a 54 y.o. female with BMI 36, chronic constipation is seen in consultation for symptomatic grade 2  external hemorrhoids.  Patient has symptoms of external hemorrhoids almost all her life, worse in last 2 months which have improved since making changes in her lifestyle.  Given her ongoing symptoms, I have discussed with her about hemorrhoid ligation as an option and patient would like to proceed with hemorrhoid ligation.  Risks and benefits discussed, consent obtained Perform hemorrhoid ligation today High-fiber diet for chronic constipation, information provided Continue healthy lifestyle and exercise Continue to work on to quit smoking  Follow up in 2 to 3 weeks   Cephas Darby, MD

## 2019-06-01 ENCOUNTER — Other Ambulatory Visit: Payer: Self-pay | Admitting: Primary Care

## 2019-06-01 DIAGNOSIS — G43709 Chronic migraine without aura, not intractable, without status migrainosus: Secondary | ICD-10-CM

## 2019-06-01 DIAGNOSIS — IMO0002 Reserved for concepts with insufficient information to code with codable children: Secondary | ICD-10-CM

## 2019-06-20 ENCOUNTER — Encounter: Payer: Self-pay | Admitting: Gastroenterology

## 2019-06-20 ENCOUNTER — Ambulatory Visit (INDEPENDENT_AMBULATORY_CARE_PROVIDER_SITE_OTHER): Payer: BC Managed Care – PPO | Admitting: Gastroenterology

## 2019-06-20 ENCOUNTER — Other Ambulatory Visit: Payer: Self-pay

## 2019-06-20 VITALS — BP 153/105 | HR 102 | Temp 98.3°F | Wt 205.4 lb

## 2019-06-20 DIAGNOSIS — K641 Second degree hemorrhoids: Secondary | ICD-10-CM | POA: Diagnosis not present

## 2019-06-20 DIAGNOSIS — K5909 Other constipation: Secondary | ICD-10-CM | POA: Diagnosis not present

## 2019-06-20 NOTE — Progress Notes (Signed)

## 2019-06-20 NOTE — Progress Notes (Signed)

## 2019-06-21 ENCOUNTER — Ambulatory Visit (INDEPENDENT_AMBULATORY_CARE_PROVIDER_SITE_OTHER): Payer: BC Managed Care – PPO | Admitting: Pulmonary Disease

## 2019-06-21 ENCOUNTER — Encounter: Payer: Self-pay | Admitting: Pulmonary Disease

## 2019-06-21 ENCOUNTER — Other Ambulatory Visit: Payer: Self-pay

## 2019-06-21 VITALS — BP 148/75 | HR 102 | Temp 97.1°F | Ht 63.0 in | Wt 207.2 lb

## 2019-06-21 DIAGNOSIS — G4733 Obstructive sleep apnea (adult) (pediatric): Secondary | ICD-10-CM

## 2019-06-21 NOTE — Progress Notes (Signed)
Brittany Pham    [id number]    1966-01-23  Primary Care Physician:Clark, Leticia Penna, NP  Referring Physician: Pleas Koch, NP [place],  [place] [address]  Chief complaint:   Patient with nonrestorative sleep  HPI:  Nonrestorative sleep Initially started with stress sores Admits to snoring No witnessed apneas Usually goes to bed between 9 and 10:30 PM Takes about 30 minutes to fall asleep Usually about 1 awakening Morning awakening time 630 to 7:30 in the morning  Admits to headaches Occasional dryness Occasional night sweats relating to menopausal symptoms No family history of sleep apnea known to patient Weight is up about 30 pounds  Active smoker  Outpatient Encounter Medications as of 06/21/2019  Medication Sig  . acyclovir (ZOVIRAX) 400 MG tablet TAKE 1 TABLET (400 MG TOTAL) BY MOUTH 2 (TWO) TIMES DAILY. FOR HERPES PREVENTION.  Marland Kitchen aspirin-acetaminophen-caffeine (EXCEDRIN MIGRAINE) 250-250-65 MG tablet Take 1 tablet by mouth every 6 (six) hours as needed for headache.  . DULoxetine (CYMBALTA) 30 MG capsule TAKE 2 CAPSULES BY MOUTH EVERY DAY  . propranolol ER (INDERAL LA) 80 MG 24 hr capsule TAKE 2 CAPSULES (160 MG TOTAL) BY MOUTH DAILY. FOR HEADACHE PREVENTION.  . SUMAtriptan (IMITREX) 50 MG tablet TAKE 1 TABLET BY MOUTH AT MIGRAINE ONSET. MAY REPEAT IN 2 HOURS IF HEADACHE PERSISTS OR RECURS. DO NOT EXCEED 2 TABLETS IN 24 HOURS.   No facility-administered encounter medications on file as of 06/21/2019.    Allergies as of 06/21/2019 - Review Complete 06/21/2019  Allergen Reaction Noted  . Azithromycin Nausea And Vomiting 10/01/2016  . Erythromycin Nausea And Vomiting 09/08/2014  . Prednisone Other (See Comments) 09/08/2014    Past Medical History:  Diagnosis Date  . Arthritis   . Chickenpox   . Complication of anesthesia    nausea after septoplasty  . Depression   . GERD (gastroesophageal reflux disease)   . Migraines    2-3  per month  . PONV (postoperative nausea and vomiting)   . Post-viral cough syndrome 05/16/2018    Past Surgical History:  Procedure Laterality Date  . CERVICAL ABLATION  2007  . CERVICAL SPINE SURGERY  2014  . COLONOSCOPY WITH PROPOFOL N/A 10/19/2018   Procedure: COLONOSCOPY WITH BIOPSY;  Surgeon: [name], MD;  Location: [facility];  Service: Endoscopy;  Laterality: N/A;  . POLYPECTOMY N/A 10/19/2018   Procedure: POLYPECTOMY;  Surgeon: [name], MD;  Location: [facility];  Service: Endoscopy;  Laterality: N/A;  . SEPTOPLASTY  1987    Family History  Problem Relation Age of Onset  . Arthritis Mother   . Breast cancer Mother 27  . Heart disease Father   . Arthritis Maternal Grandfather   . Lung cancer Maternal Grandfather     Social History   Socioeconomic History  . Marital status: Married    Spouse name: Not on file  . Number of children: Not on file  . Years of education: Not on file  . Highest education level: Not on file  Occupational History  . Not on file  Tobacco Use  . Smoking status: Light Tobacco Smoker    Packs/day: 1.00    Years: 19.00    Pack years: 19.00    Types: Cigarettes  . Smokeless tobacco: Never Used  Substance and Sexual Activity  . Alcohol use: Yes    Comment: rare  . Drug use: No  . Sexual activity: Not on file  Other Topics Concern  . Not on file  Social History Narrative   Married.   2 children, 4 step children, 1 grandchild.    Works in [employer].    Enjoys swimming, spending time with family.    Social Determinants of Health   Financial Resource Strain:   . Difficulty of Paying Living Expenses:   Food Insecurity:   . Worried About Charity fundraiser in the Last Year:   . Arboriculturist in the Last Year:   Transportation Needs:   . Film/video editor (Medical):   Marland Kitchen Lack of Transportation (Non-Medical):   Physical Activity:   . Days of Exercise per Week:   . Minutes of Exercise per Session:     Stress:   . Feeling of Stress :   Social Connections:   . Frequency of Communication with Friends and Family:   . Frequency of Social Gatherings with Friends and Family:   . Attends Religious Services:   . Active Member of Clubs or Organizations:   . Attends Archivist Meetings:   Marland Kitchen Marital Status:   Intimate Partner Violence:   . Fear of Current or Ex-Partner:   . Emotionally Abused:   Marland Kitchen Physically Abused:   . Sexually Abused:     Review of Systems  Constitutional: Positive for fatigue.  Psychiatric/Behavioral: Positive for sleep disturbance.    Vitals:   06/21/19 1450  BP: (!) 148/75  Pulse: (!) 102  Temp: (!) 97.1 F (36.2 C)  SpO2: 96%     Physical Exam  Constitutional: She appears well-developed and well-nourished.  HENT:  Head: Normocephalic and atraumatic.  Eyes: Pupils are equal, round, and reactive to light. Right eye exhibits no discharge.  Neck: No tracheal deviation present. No thyromegaly present.  Cardiovascular: Normal rate and regular rhythm.  Pulmonary/Chest: Effort normal and breath sounds normal. No respiratory distress. She has no wheezes. She has no rales. She exhibits no tenderness.  Musculoskeletal:        General: Normal range of motion.     Cervical back: Normal range of motion and neck supple.  Neurological: She is alert. No cranial nerve deficit.  Skin: Skin is warm and dry. No erythema.  Psychiatric: She has a normal mood and affect.   Results of the Epworth flowsheet 06/21/2019  Sitting and reading 3  Watching TV 3  Sitting, inactive in a public place (e.g. a theatre or a meeting) 2  As a passenger in a car for an hour without a break 3  Lying down to rest in the afternoon when circumstances permit 3  Sitting and talking to someone 1  Sitting quietly after a lunch without alcohol 0  In a car, while stopped for a few minutes in traffic 0  Total score 15    Assessment:  Nonrestorative sleep  Excessive daytime  sleepiness Daytime fatigue  Review of sleep disordered breathing discussed with the patient Treatment options for sleep disordered breathing discussed with the patient  Importance of smoking cessation discussed with patient  Plan/Recommendations: We will schedule the patient for home sleep study  Treatment options as above  I will follow-up in about 3 months  Smoking cessation counseling provided  Graded exercises as tolerated   Sherrilyn Rist MD [facility] 06/21/2019, 3:16 PM  CC: Pleas Koch, NP

## 2019-06-21 NOTE — Patient Instructions (Signed)
History of significant obstructive sleep apnea  We will schedule you for home sleep study Treatment options as discussed  Call with significant concerns Sleep Apnea Sleep apnea is a condition in which breathing pauses or becomes shallow during sleep. Episodes of sleep apnea usually last 10 seconds or longer, and they may occur as many as 20 times an hour. Sleep apnea disrupts your sleep and keeps your body from getting the rest that it needs. This condition can increase your risk of certain health problems, including:  Heart attack.  Stroke.  Obesity.  Diabetes.  Heart failure.  Irregular heartbeat. What are the causes? There are three kinds of sleep apnea:  Obstructive sleep apnea. This kind is caused by a blocked or collapsed airway.  Central sleep apnea. This kind happens when the part of the brain that controls breathing does not send the correct signals to the muscles that control breathing.  Mixed sleep apnea. This is a combination of obstructive and central sleep apnea. The most common cause of this condition is a collapsed or blocked airway. An airway can collapse or become blocked if:  Your throat muscles are abnormally relaxed.  Your tongue and tonsils are larger than normal.  You are overweight.  Your airway is smaller than normal. What increases the risk? You are more likely to develop this condition if you:  Are overweight.  Smoke.  Have a smaller than normal airway.  Are elderly.  Are female.  Drink alcohol.  Take sedatives or tranquilizers.  Have a family history of sleep apnea. What are the signs or symptoms? Symptoms of this condition include:  Trouble staying asleep.  Daytime sleepiness and tiredness.  Irritability.  Loud snoring.  Morning headaches.  Trouble concentrating.  Forgetfulness.  Decreased interest in sex.  Unexplained sleepiness.  Mood swings.  Personality changes.  Feelings of depression.  Waking up often  during the night to urinate.  Dry mouth.  Sore throat. How is this diagnosed? This condition may be diagnosed with:  A medical history.  A physical exam.  A series of tests that are done while you are sleeping (sleep study). These tests are usually done in a sleep lab, but they may also be done at home. How is this treated? Treatment for this condition aims to restore normal breathing and to ease symptoms during sleep. It may involve managing health issues that can affect breathing, such as high blood pressure or obesity. Treatment may include:  Sleeping on your side.  Using a decongestant if you have nasal congestion.  Avoiding the use of depressants, including alcohol, sedatives, and narcotics.  Losing weight if you are overweight.  Making changes to your diet.  Quitting smoking.  Using a device to open your airway while you sleep, such as: ? An oral appliance. This is a custom-made mouthpiece that shifts your lower jaw forward. ? A continuous positive airway pressure (CPAP) device. This device blows air through a mask when you breathe out (exhale). ? A nasal expiratory positive airway pressure (EPAP) device. This device has valves that you put into each nostril. ? A bi-level positive airway pressure (BPAP) device. This device blows air through a mask when you breathe in (inhale) and breathe out (exhale).  Having surgery if other treatments do not work. During surgery, excess tissue is removed to create a wider airway. It is important to get treatment for sleep apnea. Without treatment, this condition can lead to:  High blood pressure.  Coronary artery disease.  In men,  an inability to achieve or maintain an erection (impotence).  Reduced thinking abilities. Follow these instructions at home: Lifestyle  Make any lifestyle changes that your health care provider recommends.  Eat a healthy, well-balanced diet.  Take steps to lose weight if you are  overweight.  Avoid using depressants, including alcohol, sedatives, and narcotics.  Do not use any products that contain nicotine or tobacco, such as cigarettes, e-cigarettes, and chewing tobacco. If you need help quitting, ask your health care provider. General instructions  Take over-the-counter and prescription medicines only as told by your health care provider.  If you were given a device to open your airway while you sleep, use it only as told by your health care provider.  If you are having surgery, make sure to tell your health care provider you have sleep apnea. You may need to bring your device with you.  Keep all follow-up visits as told by your health care provider. This is important. Contact a health care provider if:  The device that you received to open your airway during sleep is uncomfortable or does not seem to be working.  Your symptoms do not improve.  Your symptoms get worse. Get help right away if:  You develop: ? Chest pain. ? Shortness of breath. ? Discomfort in your back, arms, or stomach.  You have: ? Trouble speaking. ? Weakness on one side of your body. ? Drooping in your face. These symptoms may represent a serious problem that is an emergency. Do not wait to see if the symptoms will go away. Get medical help right away. Call your local emergency services (911 in the [place] Do not drive yourself to the hospital. Summary  Sleep apnea is a condition in which breathing pauses or becomes shallow during sleep.  The most common cause is a collapsed or blocked airway.  The goal of treatment is to restore normal breathing and to ease symptoms during sleep. This information is not intended to replace advice given to you by your health care provider. Make sure you discuss any questions you have with your health care provider. Document Revised: 08/02/2018 Document Reviewed: 10/11/2017 Elsevier Patient Education  [place].

## 2019-07-05 ENCOUNTER — Ambulatory Visit (INDEPENDENT_AMBULATORY_CARE_PROVIDER_SITE_OTHER): Payer: BC Managed Care – PPO | Admitting: Gastroenterology

## 2019-07-05 ENCOUNTER — Other Ambulatory Visit: Payer: Self-pay

## 2019-07-05 ENCOUNTER — Encounter: Payer: Self-pay | Admitting: Gastroenterology

## 2019-07-05 VITALS — BP 163/98 | HR 101 | Temp 97.5°F | Wt 206.2 lb

## 2019-07-05 DIAGNOSIS — K5909 Other constipation: Secondary | ICD-10-CM | POA: Diagnosis not present

## 2019-07-05 DIAGNOSIS — K641 Second degree hemorrhoids: Secondary | ICD-10-CM

## 2019-07-05 NOTE — Progress Notes (Signed)
PROCEDURE NOTE: The patient presents with symptomatic grade 2 hemorrhoids, unresponsive to maximal medical therapy, requesting rubber band ligation of his/her hemorrhoidal disease.  All risks, benefits and alternative forms of therapy were described and informed consent was obtained.  The decision was made to band the LL internal hemorrhoid, and the [facility] O'Regan System was used to perform band ligation without complication.  Digital anorectal examination was then performed to assure proper positioning of the band, and to adjust the banded tissue as required.  The patient was discharged home without pain or other issues.  Dietary and behavioral recommendations were given and (if necessary - prescriptions were given), along with follow-up instructions.  The patient will return as needed for follow-up and possible additional banding as required.  No complications were encountered and the patient tolerated the procedure well.   

## 2019-07-19 ENCOUNTER — Other Ambulatory Visit: Payer: Self-pay | Admitting: Primary Care

## 2019-07-19 DIAGNOSIS — F32A Depression, unspecified: Secondary | ICD-10-CM

## 2019-07-19 NOTE — Telephone Encounter (Signed)
Ok to change? Last prescribed Duloxetine (CYMBALTA) 30 mg and taking 2 capsule. Also it is being sent to a different pharmacy.  Last OV on 05/01/2019. Next future OV scheduled on 09/27/2019

## 2019-07-19 NOTE — Telephone Encounter (Signed)
Changes made to rx, sent to pharmacy

## 2019-08-28 ENCOUNTER — Other Ambulatory Visit: Payer: Self-pay

## 2019-08-28 ENCOUNTER — Ambulatory Visit: Payer: BC Managed Care – PPO

## 2019-08-28 DIAGNOSIS — G4733 Obstructive sleep apnea (adult) (pediatric): Secondary | ICD-10-CM

## 2019-09-03 ENCOUNTER — Telehealth: Payer: Self-pay | Admitting: Pulmonary Disease

## 2019-09-03 DIAGNOSIS — G4733 Obstructive sleep apnea (adult) (pediatric): Secondary | ICD-10-CM | POA: Diagnosis not present

## 2019-09-03 NOTE — Telephone Encounter (Signed)
Call patient  Sleep study result  Date of study: 08/28/2019  Impression: Moderate obstructive sleep apnea Moderate oxygen desaturations  Recommendation: Recommend CPAP therapy for moderate obstructive sleep apnea Auto titrating CPAP with pressure settings of 5-15 will be appropriate  Follow-up as scheduled

## 2019-09-04 ENCOUNTER — Telehealth: Payer: Self-pay | Admitting: Pulmonary Disease

## 2019-09-04 DIAGNOSIS — G4733 Obstructive sleep apnea (adult) (pediatric): Secondary | ICD-10-CM

## 2019-09-04 NOTE — Telephone Encounter (Signed)
Brittany Coder, MD    12:28 PM Note Call patient  Sleep study result  Date of study: 08/28/2019  Impression: Moderate obstructive sleep apnea Moderate oxygen desaturations  Recommendation: Recommend CPAP therapy for moderate obstructive sleep apnea Auto titrating CPAP with pressure settings of 5-15 will be appropriate  Follow-up as scheduled     Called and spoke with pt letting her know the results of the HST and she verbalized understanding. Order placed for the cpap start. Recall placed for pt's follow up. Nothing further needed.

## 2019-09-04 NOTE — Telephone Encounter (Signed)
Called and left message for patient to return call.  

## 2019-09-05 NOTE — Telephone Encounter (Signed)
See alternate encounter for call and orders.

## 2019-09-10 ENCOUNTER — Other Ambulatory Visit: Payer: Self-pay | Admitting: Primary Care

## 2019-09-10 DIAGNOSIS — E785 Hyperlipidemia, unspecified: Secondary | ICD-10-CM

## 2019-09-10 DIAGNOSIS — Z0000 Encounter for general adult medical examination without abnormal findings: Secondary | ICD-10-CM

## 2019-09-10 DIAGNOSIS — R7303 Prediabetes: Secondary | ICD-10-CM

## 2019-09-10 DIAGNOSIS — Z1159 Encounter for screening for other viral diseases: Secondary | ICD-10-CM

## 2019-09-10 DIAGNOSIS — Z114 Encounter for screening for human immunodeficiency virus [HIV]: Secondary | ICD-10-CM

## 2019-09-18 ENCOUNTER — Other Ambulatory Visit (INDEPENDENT_AMBULATORY_CARE_PROVIDER_SITE_OTHER): Payer: BC Managed Care – PPO

## 2019-09-18 DIAGNOSIS — R7303 Prediabetes: Secondary | ICD-10-CM | POA: Diagnosis not present

## 2019-09-18 DIAGNOSIS — Z1159 Encounter for screening for other viral diseases: Secondary | ICD-10-CM

## 2019-09-18 DIAGNOSIS — Z114 Encounter for screening for human immunodeficiency virus [HIV]: Secondary | ICD-10-CM

## 2019-09-18 DIAGNOSIS — E785 Hyperlipidemia, unspecified: Secondary | ICD-10-CM | POA: Diagnosis not present

## 2019-09-18 LAB — COMPREHENSIVE METABOLIC PANEL
ALT: 23 U/L (ref 0–35)
AST: 14 U/L (ref 0–37)
Albumin: 4.6 g/dL (ref 3.5–5.2)
Alkaline Phosphatase: 51 U/L (ref 39–117)
BUN: 17 mg/dL (ref 6–23)
CO2: 28 mEq/L (ref 19–32)
Calcium: 10.1 mg/dL (ref 8.4–10.5)
Chloride: 102 mEq/L (ref 96–112)
Creatinine, Ser: 0.86 mg/dL (ref 0.40–1.20)
GFR: 69.21 mL/min (ref >60.00)
Glucose, Bld: 106 mg/dL — ABNORMAL HIGH (ref 70–99)
Potassium: 4.2 mEq/L (ref 3.5–5.1)
Sodium: 137 mEq/L (ref 135–145)
Total Bilirubin: 0.3 mg/dL (ref 0.2–1.2)
Total Protein: 7.2 g/dL (ref 6.0–8.3)

## 2019-09-18 LAB — CBC
HCT: 41.6 % (ref 36.0–46.0)
Hemoglobin: 14.4 g/dL (ref 12.0–15.0)
MCHC: 34.6 g/dL (ref 30.0–36.0)
MCV: 90.5 fl (ref 78.0–100.0)
Platelets: 416 10*3/uL — ABNORMAL HIGH (ref 150.0–400.0)
RBC: 4.6 Mil/uL (ref 3.87–5.11)
RDW: 13.7 % (ref 11.5–15.5)
WBC: 10.5 10*3/uL (ref 4.0–10.5)

## 2019-09-18 LAB — LIPID PANEL
Cholesterol: 170 mg/dL (ref 0–200)
HDL: 36.8 mg/dL — ABNORMAL LOW (ref >39.00)
LDL Cholesterol: 103 mg/dL — ABNORMAL HIGH (ref 0–99)
NonHDL: 133.52
Total CHOL/HDL Ratio: 5
Triglycerides: 155 mg/dL — ABNORMAL HIGH (ref 0.0–149.0)
VLDL: 31 mg/dL (ref 0.0–40.0)

## 2019-09-18 LAB — HEMOGLOBIN A1C: Hgb A1c MFr Bld: 5.8 % (ref 4.6–6.5)

## 2019-09-19 LAB — HIV ANTIBODY (ROUTINE TESTING W REFLEX): HIV 1&2 Ab, 4th Generation: NONREACTIVE

## 2019-09-19 LAB — HEPATITIS C ANTIBODY
Hepatitis C Ab: NONREACTIVE
SIGNAL TO CUT-OFF: 0.01 (ref <1.00)

## 2019-09-20 ENCOUNTER — Other Ambulatory Visit: Payer: BC Managed Care – PPO

## 2019-09-27 ENCOUNTER — Encounter: Payer: Self-pay | Admitting: Primary Care

## 2019-09-27 ENCOUNTER — Other Ambulatory Visit: Payer: Self-pay

## 2019-09-27 ENCOUNTER — Ambulatory Visit (INDEPENDENT_AMBULATORY_CARE_PROVIDER_SITE_OTHER): Payer: BC Managed Care – PPO | Admitting: Primary Care

## 2019-09-27 VITALS — BP 132/80 | HR 86 | Temp 96.1°F | Ht 63.0 in | Wt 200.0 lb

## 2019-09-27 DIAGNOSIS — M542 Cervicalgia: Secondary | ICD-10-CM

## 2019-09-27 DIAGNOSIS — K635 Polyp of colon: Secondary | ICD-10-CM | POA: Diagnosis not present

## 2019-09-27 DIAGNOSIS — F419 Anxiety disorder, unspecified: Secondary | ICD-10-CM

## 2019-09-27 DIAGNOSIS — G43709 Chronic migraine without aura, not intractable, without status migrainosus: Secondary | ICD-10-CM

## 2019-09-27 DIAGNOSIS — N393 Stress incontinence (female) (male): Secondary | ICD-10-CM

## 2019-09-27 DIAGNOSIS — Z23 Encounter for immunization: Secondary | ICD-10-CM

## 2019-09-27 DIAGNOSIS — G4733 Obstructive sleep apnea (adult) (pediatric): Secondary | ICD-10-CM

## 2019-09-27 DIAGNOSIS — Z0000 Encounter for general adult medical examination without abnormal findings: Secondary | ICD-10-CM | POA: Diagnosis not present

## 2019-09-27 DIAGNOSIS — IMO0002 Reserved for concepts with insufficient information to code with codable children: Secondary | ICD-10-CM

## 2019-09-27 DIAGNOSIS — F32A Depression, unspecified: Secondary | ICD-10-CM

## 2019-09-27 DIAGNOSIS — N951 Menopausal and female climacteric states: Secondary | ICD-10-CM

## 2019-09-27 DIAGNOSIS — A6000 Herpesviral infection of urogenital system, unspecified: Secondary | ICD-10-CM

## 2019-09-27 DIAGNOSIS — R519 Headache, unspecified: Secondary | ICD-10-CM

## 2019-09-27 DIAGNOSIS — Z1231 Encounter for screening mammogram for malignant neoplasm of breast: Secondary | ICD-10-CM

## 2019-09-27 DIAGNOSIS — F329 Major depressive disorder, single episode, unspecified: Secondary | ICD-10-CM

## 2019-09-27 DIAGNOSIS — R7303 Prediabetes: Secondary | ICD-10-CM

## 2019-09-27 DIAGNOSIS — G8929 Other chronic pain: Secondary | ICD-10-CM

## 2019-09-27 MED ORDER — SUMATRIPTAN SUCCINATE 50 MG PO TABS: ORAL_TABLET | ORAL | 0 refills | Status: DC

## 2019-09-27 NOTE — Addendum Note (Signed)
Addended by: Jacqualin Combes on: 09/27/2019 01:00 PM   Modules accepted: Orders

## 2019-09-27 NOTE — Patient Instructions (Addendum)
Continue exercising. You should be getting 150 minutes of moderate intensity exercise weekly.  Continue to work on a healthy diet. Ensure you are consuming 64 ounces of water daily.  Call the breast center to schedule your mammogram.  Schedule a nurse visit for 2-6 months complete your second Shingrix vaccine.  It was a pleasure to see you today!    Preventive Care 66-52 Years Old, Female Preventive care refers to visits with your health care provider and lifestyle choices that can promote health and wellness. This includes:  A yearly physical exam. This may also be called an annual well check.  Regular dental visits and eye exams.  Immunizations.  Screening for certain conditions.  Healthy lifestyle choices, such as eating a healthy diet, getting regular exercise, not using drugs or products that contain nicotine and tobacco, and limiting alcohol use. What can I expect for my preventive care visit? Physical exam Your health care provider will check your:  Height and weight. This may be used to calculate body mass index (BMI), which tells if you are at a healthy weight.  Heart rate and blood pressure.  Skin for abnormal spots. Counseling Your health care provider may ask you questions about your:  Alcohol, tobacco, and drug use.  Emotional well-being.  Home and relationship well-being.  Sexual activity.  Eating habits.  Work and work Statistician.  Method of birth control.  Menstrual cycle.  Pregnancy history. What immunizations do I need?  Influenza (flu) vaccine  This is recommended every year. Tetanus, diphtheria, and pertussis (Tdap) vaccine  You may need a Td booster every 10 years. Varicella (chickenpox) vaccine  You may need this if you have not been vaccinated. Zoster (shingles) vaccine  You may need this after age 28. Measles, mumps, and rubella (MMR) vaccine  You may need at least one dose of MMR if you were born in 1957 or later. You may  also need a second dose. Pneumococcal conjugate (PCV13) vaccine  You may need this if you have certain conditions and were not previously vaccinated. Pneumococcal polysaccharide (PPSV23) vaccine  You may need one or two doses if you smoke cigarettes or if you have certain conditions. Meningococcal conjugate (MenACWY) vaccine  You may need this if you have certain conditions. Hepatitis A vaccine  You may need this if you have certain conditions or if you travel or work in places where you may be exposed to hepatitis A. Hepatitis B vaccine  You may need this if you have certain conditions or if you travel or work in places where you may be exposed to hepatitis B. Haemophilus influenzae type b (Hib) vaccine  You may need this if you have certain conditions. Human papillomavirus (HPV) vaccine  If recommended by your health care provider, you may need three doses over 6 months. You may receive vaccines as individual doses or as more than one vaccine together in one shot (combination vaccines). Talk with your health care provider about the risks and benefits of combination vaccines. What tests do I need? Blood tests  Lipid and cholesterol levels. These may be checked every 5 years, or more frequently if you are over 24 years old.  Hepatitis C test.  Hepatitis B test. Screening  Lung cancer screening. You may have this screening every year starting at age 70 if you have a 30-pack-year history of smoking and currently smoke or have quit within the past 15 years.  Colorectal cancer screening. All adults should have this screening starting at age  11 and continuing until age 59. Your health care provider may recommend screening at age 61 if you are at increased risk. You will have tests every 1-10 years, depending on your results and the type of screening test.  Diabetes screening. This is done by checking your blood sugar (glucose) after you have not eaten for a while (fasting). You may  have this done every 1-3 years.  Mammogram. This may be done every 1-2 years. Talk with your health care provider about when you should start having regular mammograms. This may depend on whether you have a family history of breast cancer.  BRCA-related cancer screening. This may be done if you have a family history of breast, ovarian, tubal, or peritoneal cancers.  Pelvic exam and Pap test. This may be done every 3 years starting at age 79. Starting at age 26, this may be done every 5 years if you have a Pap test in combination with an HPV test. Other tests  Sexually transmitted disease (STD) testing.  Bone density scan. This is done to screen for osteoporosis. You may have this scan if you are at high risk for osteoporosis. Follow these instructions at home: Eating and drinking  Eat a diet that includes fresh fruits and vegetables, whole grains, lean protein, and low-fat dairy.  Take vitamin and mineral supplements as recommended by your health care provider.  Do not drink alcohol if: ? Your health care provider tells you not to drink. ? You are pregnant, may be pregnant, or are planning to become pregnant.  If you drink alcohol: ? Limit how much you have to 0-1 drink a day. ? Be aware of how much alcohol is in your drink. In the [place] one drink equals one 12 oz bottle of beer (355 mL), one 5 oz glass of wine (148 mL), or one 1 oz glass of hard liquor (44 mL). Lifestyle  Take daily care of your teeth and gums.  Stay active. Exercise for at least 30 minutes on 5 or more days each week.  Do not use any products that contain nicotine or tobacco, such as cigarettes, e-cigarettes, and chewing tobacco. If you need help quitting, ask your health care provider.  If you are sexually active, practice safe sex. Use a condom or other form of birth control (contraception) in order to prevent pregnancy and STIs (sexually transmitted infections).  If told by your health care provider, take  low-dose aspirin daily starting at age 17. What's next?  Visit your health care provider once a year for a well check visit.  Ask your health care provider how often you should have your eyes and teeth checked.  Stay up to date on all vaccines. This information is not intended to replace advice given to you by your health care provider. Make sure you discuss any questions you have with your health care provider. Document Revised: 10/27/2017 Document Reviewed: 10/27/2017 Elsevier Patient Education  2020 [employer].

## 2019-09-27 NOTE — Assessment & Plan Note (Signed)
Slightly improved from last year, commended her on regular exercise and dietary changes. Continue to monitor.

## 2019-09-27 NOTE — Assessment & Plan Note (Signed)
Doing well on Cymbalta 60 mg, denies SI/HI. Continue same.

## 2019-09-27 NOTE — Assessment & Plan Note (Signed)
Following with pulmonology, will be picking up CPAP machine today.

## 2019-09-27 NOTE — Assessment & Plan Note (Signed)
Following with a natural MD in [place] who is treating her very low estrogen with pellets and oral tablets.   Also on weekly B12 injections.

## 2019-09-27 NOTE — Assessment & Plan Note (Signed)
Shingrix due, first vaccine provided today. She will return in 2-6 months for second. Pap smear UTD. Mammogram due, orders placed. Colonoscopy UTD, due in 2025.  Continue regular exercise, healthy diet. Exam today benign. Labs reviewed.

## 2019-09-27 NOTE — Assessment & Plan Note (Signed)
Improved on daily Propranolol ER 160 mg, migraines occurring once monthly. Continue current regimen.

## 2019-09-27 NOTE — Assessment & Plan Note (Signed)
Following with GI, recall for colonoscopy in 2025.

## 2019-09-27 NOTE — Assessment & Plan Note (Signed)
No recent outbreaks, continue daily acyclovir for prevention.

## 2019-09-27 NOTE — Assessment & Plan Note (Signed)
Improved overall, previously following with a chiropractor and continues to feel well.

## 2019-09-27 NOTE — Assessment & Plan Note (Signed)
Doing well on Propranolol 160 mg daily, migraines occurring only once monthly which is an improvement.   Continue propranolol ER 160 mg daily and PRN sumatriptan.

## 2019-09-27 NOTE — Assessment & Plan Note (Signed)
Overall stable, continue to monitor.  

## 2019-09-27 NOTE — Progress Notes (Signed)
Subjective:    Patient ID: Brittany Pham, female    DOB: 11/17/1965, 54 y.o.   MRN: [id number]  HPI  This visit occurred during the SARS-CoV-2 public health emergency.  Safety protocols were in place, including screening questions prior to the visit, additional usage of staff PPE, and extensive cleaning of exam room while observing appropriate contact time as indicated for disinfecting solutions.   Brittany Pham is a 54 year old female who presents today for complete physical.  Immunizations: -Tetanus: Completed in 2016 -Influenza: Due this season  -Shingles: Never completed  -Covid-19: Did not complete, is thinking about this.  Diet: She endorses a healthy diet.  Exercise: She is working out three times weekly  Eye exam: Due in September 2021 Dental exam: No recent exam  Pap Smear: Completed in 2020 Mammogram: Due Colonoscopy: Completed in 2020, due in 2025 Hep C Screen: Negative  BP Readings from Last 3 Encounters:  09/27/19 (!) 132/80  07/05/19 (!) 163/98  06/21/19 (!) 148/75    Wt Readings from Last 3 Encounters:  09/27/19 200 lb (90.7 kg)  07/05/19 206 lb 4 oz (93.6 kg)  06/21/19 207 lb 3.2 oz (94 kg)     Review of Systems  Constitutional: Negative for unexpected weight change.  HENT: Negative for rhinorrhea.   Respiratory: Negative for cough and shortness of breath.   Cardiovascular: Negative for chest pain.  Gastrointestinal: Negative for constipation and diarrhea.  Genitourinary: Negative for difficulty urinating.  Musculoskeletal: Positive for arthralgias.       Chronic neck pain  Skin: Negative for rash.  Allergic/Immunologic: Negative for environmental allergies.  Neurological: Negative for dizziness, numbness and headaches.  Psychiatric/Behavioral: The patient is not nervous/anxious.        Past Medical History:  Diagnosis Date  . Arthritis   . Chickenpox   . Complication of anesthesia    nausea after septoplasty  . Depression   . GERD  (gastroesophageal reflux disease)   . Migraines    2-3 per month  . PONV (postoperative nausea and vomiting)   . Post-viral cough syndrome 05/16/2018     Social History   Socioeconomic History  . Marital status: Married    Spouse name: Not on file  . Number of children: Not on file  . Years of education: Not on file  . Highest education level: Not on file  Occupational History  . Not on file  Tobacco Use  . Smoking status: Light Tobacco Smoker    Packs/day: 1.00    Years: 19.00    Pack years: 19.00    Types: Cigarettes  . Smokeless tobacco: Never Used  Vaping Use  . Vaping Use: Never used  Substance and Sexual Activity  . Alcohol use: Yes    Comment: rare  . Drug use: No  . Sexual activity: Not on file  Other Topics Concern  . Not on file  Social History Narrative   Married.   2 children, 4 step children, 1 grandchild.    Works in [employer].    Enjoys swimming, spending time with family.    Social Determinants of Health   Financial Resource Strain:   . Difficulty of Paying Living Expenses:   Food Insecurity:   . Worried About Charity fundraiser in the Last Year:   . Arboriculturist in the Last Year:   Transportation Needs:   . Film/video editor (Medical):   Marland Kitchen Lack of Transportation (Non-Medical):   Physical Activity:   .  Days of Exercise per Week:   . Minutes of Exercise per Session:   Stress:   . Feeling of Stress :   Social Connections:   . Frequency of Communication with Friends and Family:   . Frequency of Social Gatherings with Friends and Family:   . Attends Religious Services:   . Active Member of Clubs or Organizations:   . Attends Archivist Meetings:   Marland Kitchen Marital Status:   Intimate Partner Violence:   . Fear of Current or Ex-Partner:   . Emotionally Abused:   Marland Kitchen Physically Abused:   . Sexually Abused:     Past Surgical History:  Procedure Laterality Date  . CERVICAL ABLATION  2007  . CERVICAL SPINE SURGERY  2014  .  COLONOSCOPY WITH PROPOFOL N/A 10/19/2018   Procedure: COLONOSCOPY WITH BIOPSY;  Surgeon: [name], MD;  Location: [place];  Service: Endoscopy;  Laterality: N/A;  . POLYPECTOMY N/A 10/19/2018   Procedure: POLYPECTOMY;  Surgeon: [name], MD;  Location: [place];  Service: Endoscopy;  Laterality: N/A;  . SEPTOPLASTY  1987    Family History  Problem Relation Age of Onset  . Arthritis Mother   . Breast cancer Mother 15  . Heart disease Father   . Arthritis Maternal Grandfather   . Lung cancer Maternal Grandfather     Allergies  Allergen Reactions  . Azithromycin Nausea And Vomiting  . Erythromycin Nausea And Vomiting  . Prednisone Other (See Comments)    Passes out  syncope    Current Outpatient Medications on File Prior to Visit  Medication Sig Dispense Refill  . acyclovir (ZOVIRAX) 400 MG tablet TAKE 1 TABLET (400 MG TOTAL) BY MOUTH 2 (TWO) TIMES DAILY. FOR HERPES PREVENTION. 180 tablet 0  . aspirin-acetaminophen-caffeine (EXCEDRIN MIGRAINE) 250-250-65 MG tablet Take 1 tablet by mouth every 6 (six) hours as needed for headache.    . DULoxetine (CYMBALTA) 60 MG capsule Take 1 capsule (60 mg total) by mouth daily. For anxiety and depression 90 capsule 3  . propranolol ER (INDERAL LA) 80 MG 24 hr capsule TAKE 2 CAPSULES (160 MG TOTAL) BY MOUTH DAILY. FOR HEADACHE PREVENTION. 180 capsule 1  . SUMAtriptan (IMITREX) 50 MG tablet TAKE 1 TABLET BY MOUTH AT MIGRAINE ONSET. MAY REPEAT IN 2 HOURS IF HEADACHE PERSISTS OR RECURS. DO NOT EXCEED 2 TABLETS IN 24 HOURS. 9 tablet 1   No current facility-administered medications on file prior to visit.    BP (!) 132/80   Pulse 86   Temp (!) 96.1 F (35.6 C) (Temporal)   Ht 5\' 3"  (1.6 m)   Wt 200 lb (90.7 kg)   SpO2 98%   BMI 35.43 kg/m    Objective:   Physical Exam HENT:     Right Ear: Tympanic membrane and ear canal normal.     Left Ear: Tympanic membrane and ear canal normal.  Eyes:     Pupils: Pupils  are equal, round, and reactive to light.  Cardiovascular:     Rate and Rhythm: Normal rate and regular rhythm.  Pulmonary:     Effort: Pulmonary effort is normal.     Breath sounds: Normal breath sounds.  Abdominal:     General: Bowel sounds are normal.     Palpations: Abdomen is soft.     Tenderness: There is no abdominal tenderness.  Musculoskeletal:        General: Normal range of motion.     Cervical back: Neck supple.  Skin:  General: Skin is warm and dry.  Neurological:     Mental Status: She is alert and oriented to person, place, and time.     Cranial Nerves: No cranial nerve deficit.     Deep Tendon Reflexes:     Reflex Scores:      Patellar reflexes are 2+ on the right side and 2+ on the left side. Psychiatric:        Mood and Affect: Mood normal.            Assessment & Plan:

## 2019-10-27 ENCOUNTER — Other Ambulatory Visit: Payer: Self-pay | Admitting: Primary Care

## 2019-10-27 DIAGNOSIS — IMO0002 Reserved for concepts with insufficient information to code with codable children: Secondary | ICD-10-CM

## 2019-11-15 ENCOUNTER — Ambulatory Visit: Payer: BC Managed Care – PPO

## 2019-12-04 ENCOUNTER — Other Ambulatory Visit: Payer: Self-pay

## 2019-12-04 ENCOUNTER — Ambulatory Visit: Payer: BC Managed Care – PPO | Admitting: *Deleted

## 2019-12-05 NOTE — Progress Notes (Signed)
Patient no showed nurse visit for 2nd Shingrix vaccine per K. Zigmund Daniel, CMA

## 2020-01-29 ENCOUNTER — Ambulatory Visit: Payer: BC Managed Care – PPO

## 2020-03-06 ENCOUNTER — Other Ambulatory Visit: Payer: Self-pay

## 2020-03-06 DIAGNOSIS — A6000 Herpesviral infection of urogenital system, unspecified: Secondary | ICD-10-CM

## 2020-03-06 MED ORDER — ACYCLOVIR 400 MG PO TABS: 400.0000 mg | ORAL_TABLET | Freq: Two times a day (BID) | ORAL | 0 refills | Status: DC

## 2020-03-10 ENCOUNTER — Ambulatory Visit
Admission: RE | Admit: 2020-03-10 | Discharge: 2020-03-10 | Disposition: A | Discharge status: Home / Self Care | Payer: BC Managed Care – PPO | Source: Ambulatory Visit

## 2020-03-10 ENCOUNTER — Other Ambulatory Visit: Payer: Self-pay

## 2020-03-10 DIAGNOSIS — Z1231 Encounter for screening mammogram for malignant neoplasm of breast: Secondary | ICD-10-CM

## 2020-03-12 ENCOUNTER — Other Ambulatory Visit: Payer: Self-pay | Admitting: Primary Care

## 2020-03-12 ENCOUNTER — Other Ambulatory Visit: Payer: Self-pay

## 2020-03-12 DIAGNOSIS — Z1231 Encounter for screening mammogram for malignant neoplasm of breast: Secondary | ICD-10-CM

## 2020-03-17 IMAGING — MG DIGITAL DIAGNOSTIC BILATERAL MAMMOGRAM WITH TOMO AND CAD
8 series · 8 of 24 positions shown · non-contrast
Comparison: Previous exam(s).

CLINICAL DATA: Follow-up for probably benign right breast
asymmetry.

EXAM:
DIGITAL DIAGNOSTIC BILATERAL MAMMOGRAM WITH CAD AND TOMO

[R CC synth-2D]
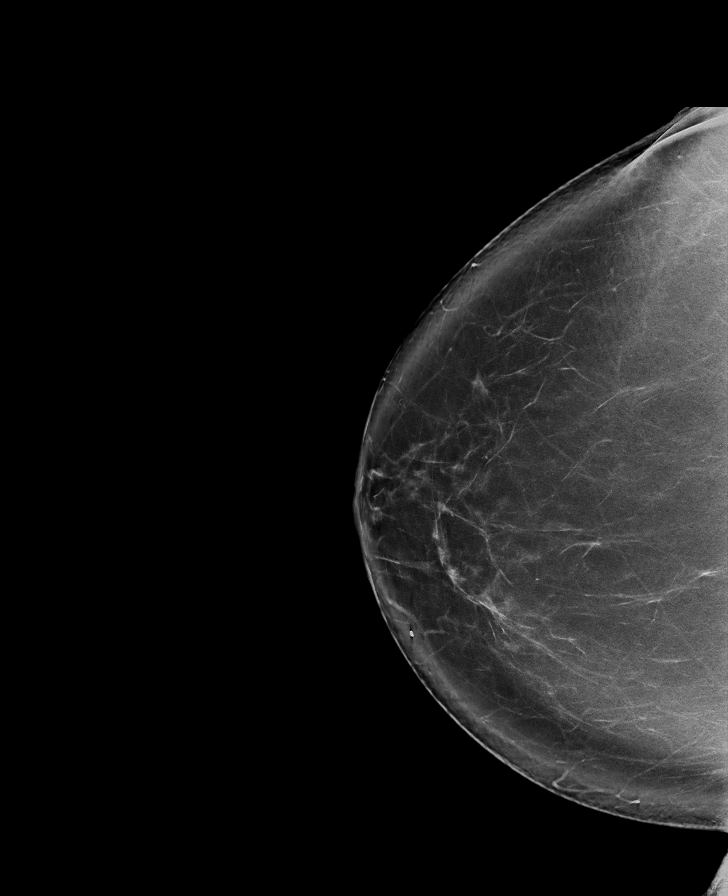

[R MLO synth-2D]
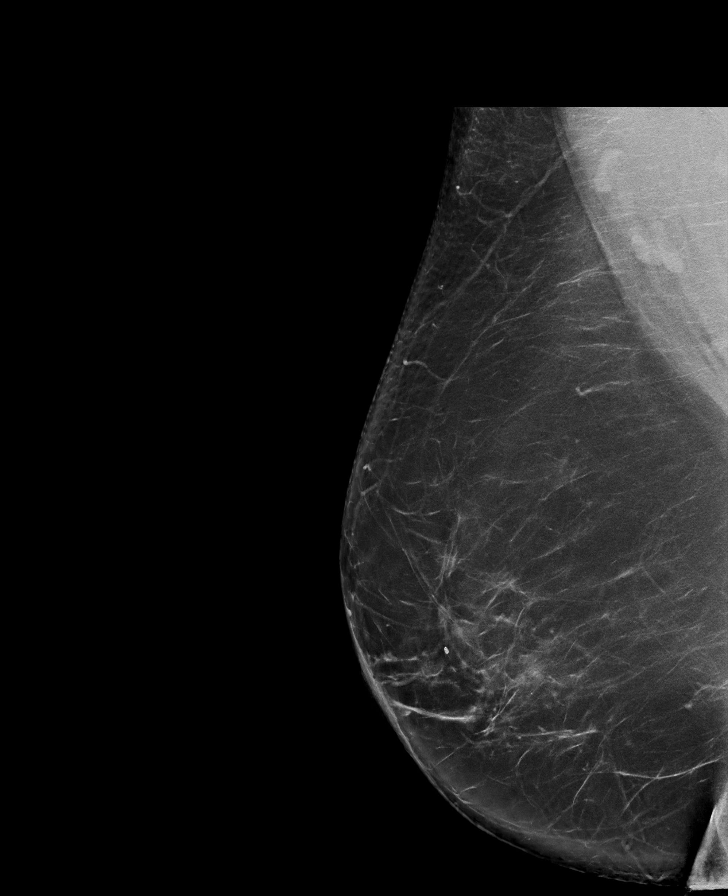

[L CC synth-2D]
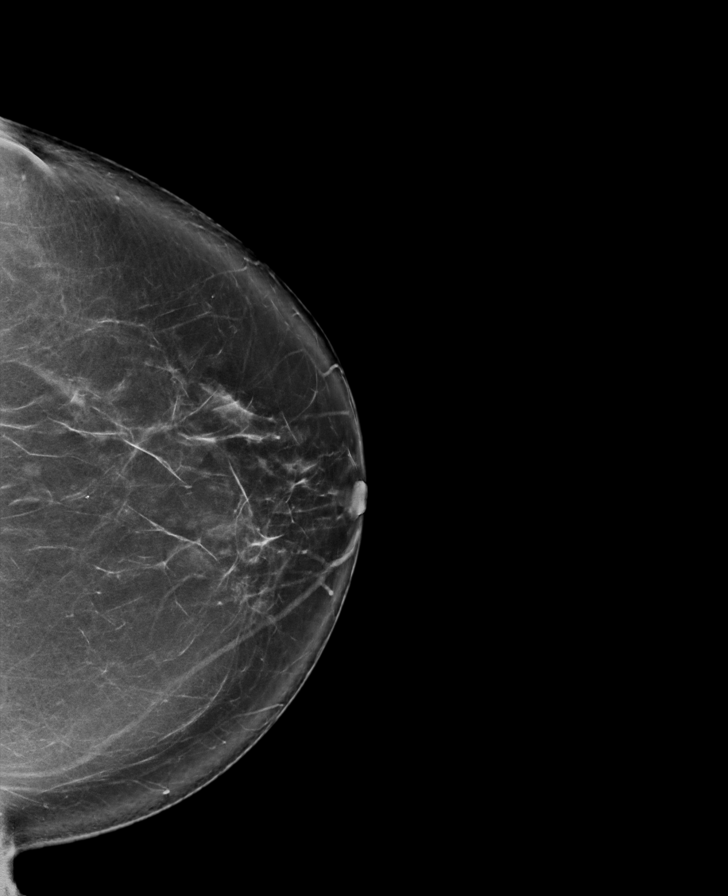

[L MLO synth-2D]
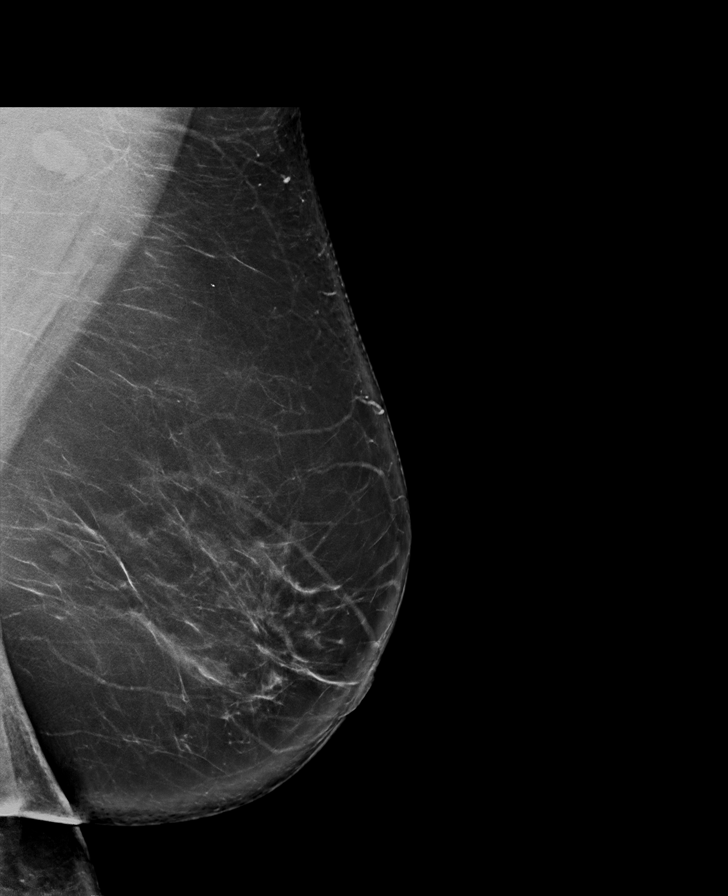

[L CC tomo · tomo slice 47/92.0]
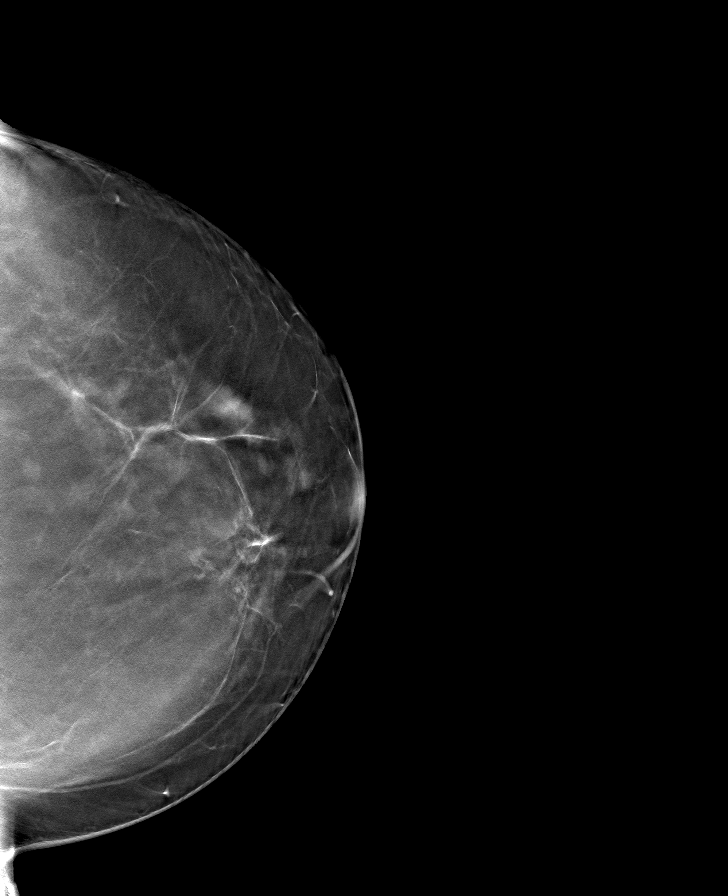

[L MLO tomo · tomo slice 53/104.0]
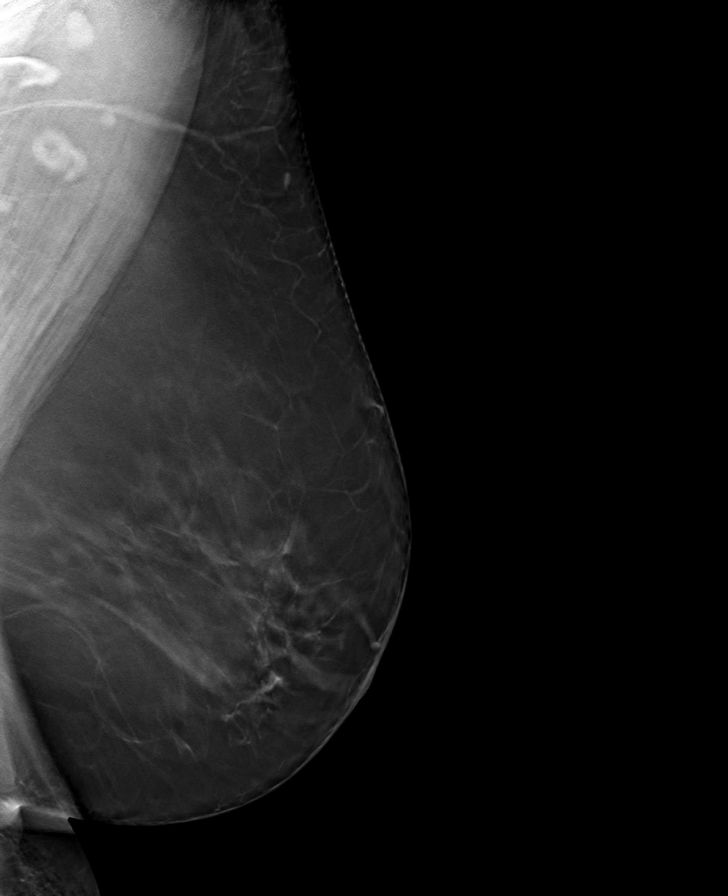

[R MLO tomo · tomo slice 51/101.0]
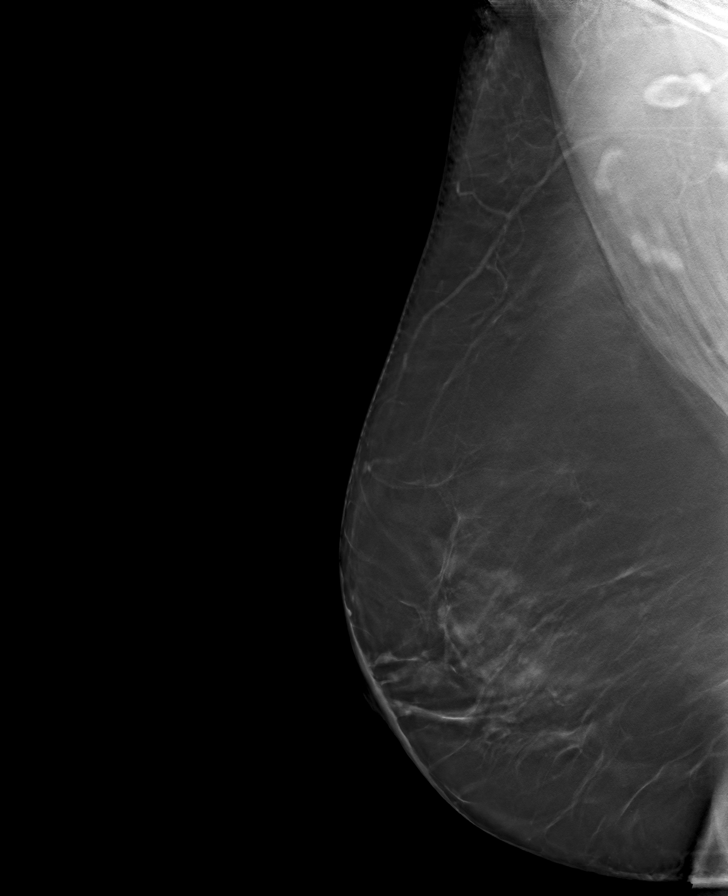

[R CC tomo · tomo slice 53/105.0]
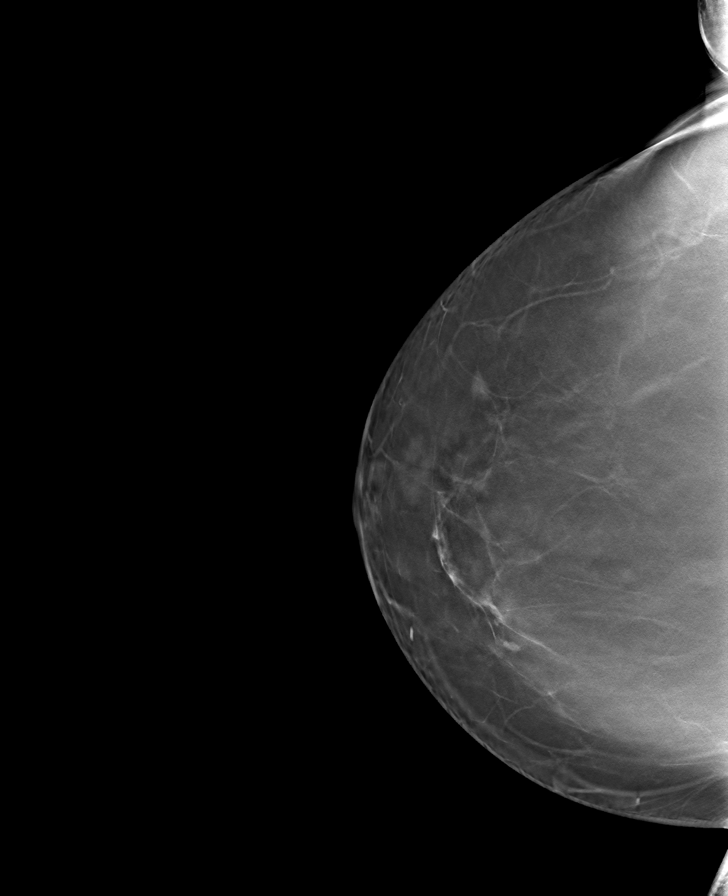

[8 of 24 positions shown; findings below may reference images not displayed]

ACR Breast Density Category b: There are scattered areas of
fibroglandular density.
FINDINGS: No suspicious masses or calcifications are seen in either breast.

The probably benign asymmetry in the upper inner right breast is
stable and demonstrates imaging features consistent with normal
fibroglandular tissue. There is no mammographic evidence of
malignancy.

Mammographic images were processed with CAD.
IMPRESSION: No mammographic evidence of malignancy in either breast.

RECOMMENDATION:
Screening mammogram in one year.(Code:Z8-6-CX6)

I have discussed the findings and recommendations with the patient.
Results were also provided in writing at the conclusion of the
visit. If applicable, a reminder letter will be sent to the patient
regarding the next appointment.

BI-RADS CATEGORY  2: Benign.

## 2020-04-02 ENCOUNTER — Other Ambulatory Visit: Payer: Self-pay

## 2020-04-02 DIAGNOSIS — G43909 Migraine, unspecified, not intractable, without status migrainosus: Secondary | ICD-10-CM

## 2020-04-02 MED ORDER — SUMATRIPTAN SUCCINATE 50 MG PO TABS: ORAL_TABLET | ORAL | 0 refills | Status: DC

## 2020-05-28 ENCOUNTER — Other Ambulatory Visit: Payer: Self-pay | Admitting: Primary Care

## 2020-05-28 DIAGNOSIS — G43909 Migraine, unspecified, not intractable, without status migrainosus: Secondary | ICD-10-CM

## 2020-05-28 DIAGNOSIS — A6000 Herpesviral infection of urogenital system, unspecified: Secondary | ICD-10-CM

## 2020-05-30 DIAGNOSIS — G43909 Migraine, unspecified, not intractable, without status migrainosus: Secondary | ICD-10-CM

## 2020-05-31 MED ORDER — SUMATRIPTAN SUCCINATE 50 MG PO TABS: ORAL_TABLET | ORAL | 0 refills | Status: DC

## 2020-05-31 NOTE — Telephone Encounter (Signed)
I have sent in, instructed pt to follow up with PCP Webb Silversmith, NP

## 2020-06-08 ENCOUNTER — Telehealth: Payer: Self-pay | Admitting: Primary Care

## 2020-06-08 DIAGNOSIS — R519 Headache, unspecified: Secondary | ICD-10-CM

## 2020-06-08 NOTE — Telephone Encounter (Signed)
Needs CPE and follow up in early August, please schedule

## 2020-06-11 NOTE — Telephone Encounter (Signed)
Pt is scheduled for physical on 8/2

## 2020-07-11 ENCOUNTER — Other Ambulatory Visit: Payer: Self-pay | Admitting: Internal Medicine

## 2020-07-11 DIAGNOSIS — G43909 Migraine, unspecified, not intractable, without status migrainosus: Secondary | ICD-10-CM

## 2020-07-18 NOTE — Telephone Encounter (Signed)
Frequent use of sumatriptan.  Is she still struggling with migraines? How often is she taking sumatriptan?  If she is using sumatriptan more than 1-2 times monthly then needs to be scheduled for a follow-up office visit.

## 2020-07-22 NOTE — Telephone Encounter (Signed)
Noted. Will evaluate. Refill(s) sent to pharmacy.

## 2020-07-29 ENCOUNTER — Ambulatory Visit: Payer: BC Managed Care – PPO | Admitting: Primary Care

## 2020-07-29 ENCOUNTER — Encounter: Payer: Self-pay | Admitting: Primary Care

## 2020-07-29 ENCOUNTER — Other Ambulatory Visit: Payer: Self-pay

## 2020-07-29 VITALS — BP 130/78 | HR 82 | Temp 98.6°F | Ht 63.0 in | Wt 211.0 lb

## 2020-07-29 DIAGNOSIS — F419 Anxiety disorder, unspecified: Secondary | ICD-10-CM

## 2020-07-29 DIAGNOSIS — G43709 Chronic migraine without aura, not intractable, without status migrainosus: Secondary | ICD-10-CM

## 2020-07-29 DIAGNOSIS — G43909 Migraine, unspecified, not intractable, without status migrainosus: Secondary | ICD-10-CM | POA: Diagnosis not present

## 2020-07-29 DIAGNOSIS — F32A Depression, unspecified: Secondary | ICD-10-CM

## 2020-07-29 MED ORDER — DULOXETINE HCL 30 MG PO CPEP: 30.0000 mg | ORAL_CAPSULE | Freq: Every day | ORAL | 0 refills | Status: DC

## 2020-07-29 MED ORDER — RIZATRIPTAN BENZOATE 10 MG PO TABS: ORAL_TABLET | ORAL | 0 refills | Status: DC

## 2020-07-29 NOTE — Assessment & Plan Note (Signed)
Chronic, deteroirated with added home stressors.  Discussed options, will try to work on anxiety/reduce stress.   Stop sumatriptan as this is ineffective. Will trial rizatriptan 10 mg.  Continue propranolol ER 160 mg.  We will see her back in a few months for follow up.

## 2020-07-29 NOTE — Progress Notes (Signed)
Subjective:    Patient ID: Brittany Pham, female    DOB: Nov 11, 1965, 55 y.o.   MRN: [id number]  HPI  Brittany Pham is a very pleasant 55 y.o. female with a history of migraines, OSA, chronic neck pain, prediabetes who presents today to discuss migraines and anxiety.  She's required refills of her sumatriptan 50 mg several times over the last two months, given this it was recommended she come in to discuss. She is managed on propranolol ER 160 mg for prevention which is ineffective, was somewhat effective in the beginning.   She's noticed that sumatriptan has become ineffective, always has to take a second tablet which "sometimes helps". She's under a tremendous amount of stress at home which causes migraines/headaches. Also weather changes cause migraines.   General headaches occur daily to right frontal and occipital lobes. Migraines occur once weekly lasting 1 day, located in same place. She's tried Topamax in the past which altered her taste.   She is managed on duloxetine 60 mg for which has historically effective until her added stressors at home. She thinks she would benefit from a dose adjustment.    Review of Systems  Neurological: Positive for headaches.  Psychiatric/Behavioral: The patient is nervous/anxious.          Past Medical History:  Diagnosis Date  . Arthritis   . Chickenpox   . Complication of anesthesia    nausea after septoplasty  . Depression   . GERD (gastroesophageal reflux disease)   . Migraines    2-3 per month  . PONV (postoperative nausea and vomiting)   . Post-viral cough syndrome 05/16/2018    Social History   Socioeconomic History  . Marital status: Married    Spouse name: Not on file  . Number of children: Not on file  . Years of education: Not on file  . Highest education level: Not on file  Occupational History  . Not on file  Tobacco Use  . Smoking status: Light Tobacco Smoker    Packs/day: 1.00    Years: 19.00    Pack  years: 19.00    Types: Cigarettes  . Smokeless tobacco: Never Used  Vaping Use  . Vaping Use: Never used  Substance and Sexual Activity  . Alcohol use: Yes    Comment: rare  . Drug use: No  . Sexual activity: Not on file  Other Topics Concern  . Not on file  Social History Narrative   Married.   2 children, 4 step children, 1 grandchild.    Works in [employer].    Enjoys swimming, spending time with family.    Social Determinants of Health   Financial Resource Strain: Not on file  Food Insecurity: Not on file  Transportation Needs: Not on file  Physical Activity: Not on file  Stress: Not on file  Social Connections: Not on file  Intimate Partner Violence: Not on file    Past Surgical History:  Procedure Laterality Date  . CERVICAL ABLATION  2007  . CERVICAL SPINE SURGERY  2014  . COLONOSCOPY WITH PROPOFOL N/A 10/19/2018   Procedure: COLONOSCOPY WITH BIOPSY;  Surgeon: [name], MD;  Location: [place];  Service: Endoscopy;  Laterality: N/A;  . POLYPECTOMY N/A 10/19/2018   Procedure: POLYPECTOMY;  Surgeon: [name], MD;  Location: [place];  Service: Endoscopy;  Laterality: N/A;  . SEPTOPLASTY  1987    Family History  Problem Relation Age of Onset  . Arthritis Mother   .  Breast cancer Mother 108  . Heart disease Father   . Arthritis Maternal Grandfather   . Lung cancer Maternal Grandfather     Allergies  Allergen Reactions  . Azithromycin Nausea And Vomiting  . Erythromycin Nausea And Vomiting  . Prednisone Other (See Comments)    Passes out  syncope    Current Outpatient Medications on File Prior to Visit  Medication Sig Dispense Refill  . acyclovir (ZOVIRAX) 400 MG tablet TAKE 1 TABLET (400 MG TOTAL) BY MOUTH 2 (TWO) TIMES DAILY. FOR HERPES PREVENTION. 180 tablet 0  . aspirin-acetaminophen-caffeine (EXCEDRIN MIGRAINE) 250-250-65 MG tablet Take 1 tablet by mouth every 6 (six) hours as needed for headache.    . DULoxetine (CYMBALTA)  60 MG capsule Take 1 capsule (60 mg total) by mouth daily. For anxiety and depression 90 capsule 3  . propranolol ER (INDERAL LA) 80 MG 24 hr capsule TAKE 2 CAPSULES (160 MG TOTAL) BY MOUTH DAILY. FOR HEADACHE PREVENTION. 180 capsule 0  . SUMAtriptan (IMITREX) 50 MG tablet TAKE 1 TABLET BY MOUTH AT MIGRAINE ONSET, MAY REPEAT IN 2 HOURS IF HEADACHE PERSISTS OR RECURS. 9 tablet 0   No current facility-administered medications on file prior to visit.    BP 130/78   Pulse 82   Temp 98.6 F (37 C) (Temporal)   Ht 5\' 3"  (1.6 m)   Wt 211 lb (95.7 kg)   SpO2 98%   BMI 37.38 kg/m  Objective:   Physical Exam Cardiovascular:     Rate and Rhythm: Normal rate and regular rhythm.  Pulmonary:     Effort: Pulmonary effort is normal.     Breath sounds: Normal breath sounds.  Musculoskeletal:     Cervical back: Neck supple.  Skin:    General: Skin is warm and dry.           Assessment & Plan:      This visit occurred during the SARS-CoV-2 public health emergency.  Safety protocols were in place, including screening questions prior to the visit, additional usage of staff PPE, and extensive cleaning of exam room while observing appropriate contact time as indicated for disinfecting solutions.

## 2020-07-29 NOTE — Patient Instructions (Signed)
We added 30 mg of duloxetine (Cymbalta) to your regimen. Take the 30 mg with the 60 mg.  Stop sumatriptan for migraine abortion. Try rizatriptan (Maxalt) 10 mg. Take 1 tablet at migraine onset, may add another tablet in 2 hours if migraine persists.   We will see you in August for your physical.   It was a pleasure to see you today!

## 2020-07-29 NOTE — Assessment & Plan Note (Addendum)
Deteriorated, significant home stressors causing migraines/increased headaches.  Historically done well with Cymbalta so will add additional 30 mg to her 60 mg to make 90 mg. New Rx sent to pharmacy.   We will see her back in August for follow up.

## 2020-08-14 ENCOUNTER — Other Ambulatory Visit: Payer: Self-pay | Admitting: Primary Care

## 2020-08-14 DIAGNOSIS — F32A Depression, unspecified: Secondary | ICD-10-CM

## 2020-09-04 ENCOUNTER — Other Ambulatory Visit: Payer: Self-pay | Admitting: Primary Care

## 2020-09-04 DIAGNOSIS — R519 Headache, unspecified: Secondary | ICD-10-CM

## 2020-09-05 ENCOUNTER — Other Ambulatory Visit: Payer: Self-pay | Admitting: Primary Care

## 2020-09-05 DIAGNOSIS — A6000 Herpesviral infection of urogenital system, unspecified: Secondary | ICD-10-CM

## 2020-09-15 NOTE — Telephone Encounter (Signed)
Agree with UC evaluation.

## 2020-09-15 NOTE — Telephone Encounter (Signed)
I spoke with pt; pt said starting over weekend began with symptoms of fever 102 now; pt taking Advil 800 mg q 4 h.pt having chills,body aches, sweating a lot, h/a pain level of 7; pt has been gagging but no vomiting. Pt has not eaten since 09/11/20 but pt is drinking water and gatorade approx 7 - 8 glasses daily. Pt does not have other covid symptoms; no cough SOB. Pt is having pain and burning upon urination with frequency and urgency. Pt was exposed to covid by boss who tested + for covid within the week (no mask worn at work). Pt tested neg with home covid test on 09/13/20. Pt is going to [facility] in [place] now. Sending note to Gentry Fitz NP and [facility] CMA.

## 2020-09-15 NOTE — Telephone Encounter (Signed)
Please call patient

## 2020-09-26 ENCOUNTER — Telehealth: Payer: Self-pay | Admitting: Primary Care

## 2020-09-26 NOTE — Telephone Encounter (Signed)
Noted, will discuss during visit.

## 2020-09-26 NOTE — Telephone Encounter (Signed)
Called pt to remind her of her appt. Pt stated that she had a double kidney infection on last week and was given antibiotics. Pt sated she wanted Clark to check her white blood count when she comes in for her appt to see if it is coming down.

## 2020-09-29 ENCOUNTER — Other Ambulatory Visit: Payer: Self-pay

## 2020-09-29 ENCOUNTER — Ambulatory Visit
Admission: RE | Admit: 2020-09-29 | Discharge: 2020-09-29 | Disposition: A | Discharge status: Home / Self Care | Payer: BC Managed Care – PPO | Source: Ambulatory Visit | Attending: Medical Oncology | Admitting: Medical Oncology

## 2020-09-29 VITALS — BP 147/95 | HR 118 | Temp 98.0°F | Resp 20

## 2020-09-29 DIAGNOSIS — R1033 Periumbilical pain: Secondary | ICD-10-CM

## 2020-09-29 DIAGNOSIS — Z9289 Personal history of other medical treatment: Secondary | ICD-10-CM | POA: Diagnosis not present

## 2020-09-29 LAB — URINALYSIS, COMPLETE (UACMP) WITH MICROSCOPIC
Bilirubin Urine: NEGATIVE
Glucose, UA: NEGATIVE mg/dL
Ketones, ur: NEGATIVE mg/dL
Leukocytes,Ua: NEGATIVE
Nitrite: NEGATIVE
Protein, ur: NEGATIVE mg/dL
Specific Gravity, Urine: 1.015 (ref 1.005–1.030)
pH: 6.5 (ref 5.0–8.0)

## 2020-09-29 NOTE — Discharge Instructions (Addendum)
Please go directly to the emergency room 

## 2020-09-29 NOTE — ED Triage Notes (Signed)
Pt presents with c/o back pain  that began last night , pt was treated for kidney infection 2 weeks ago and pain is similar, pt also having abdominal pain and making little urine since yesterday

## 2020-09-29 NOTE — ED Provider Notes (Signed)
[facility]    CSN: [id number] Arrival date & time: 09/29/20  1448      History   Chief Complaint Chief Complaint  Patient presents with   Back Pain    HPI MATTIA WESSNER is a 55 y.o. female.   HPI   Back Pain: Patient was last seen on 09/15/2020 in the emergency department for flank pain and dysuria.  She was found to have a low-grade temperature of 100.4 and was tachycardic in the 120s.  Upon review of the note she appeared to have CVA tenderness as well.  Her urinalysis was suggestive of a urinary tract infection and a CT of the abdomen suggested pyelonephritis with sepsis given her vital signs.  CBC White blood cell count showed a count of 17.2. She was admitted to the hospital for sepsis work-up.  She was given Omnicef and Zofran during her clinical course.  Today she reports to our office for further evaluation.  She states that her symptoms fully resolved from her hospitalization. She states that she is having bilateral back pain that began last night.  She is having abdominal pain and has not been able to make much urine since yesterday.  She reports her abdominal pain as severe.  Abdominal pain is localized around her umbilicus.  She is having less flank and back pain than she did with her last infection.  She is having nausea but denies any diarrhea or constipation.  She believes that she did have a fever and chills however she has recently taken Advil and does not currently have a fever.  Past Medical History:  Diagnosis Date   Arthritis    Chickenpox    Complication of anesthesia    nausea after septoplasty   Depression    GERD (gastroesophageal reflux disease)    Migraines    2-3 per month   PONV (postoperative nausea and vomiting)    Post-viral cough syndrome 05/16/2018    Patient Active Problem List   Diagnosis Date Noted   OSA (obstructive sleep apnea) 05/01/2019   External hemorrhoid 05/01/2019   Encounter for screening colonoscopy    Polyp of  ascending colon    Rectal polyp    Myopia of both eyes 05/09/2018   Presbyopia 05/09/2018   Genital herpes 09/19/2017   Frequent headaches 09/19/2017   Stress incontinence of urine 09/19/2017   Prediabetes 09/19/2017   Menopausal symptoms 09/19/2017   Migraines 09/22/2016   Chronic neck pain 09/22/2016   Anxiety and depression 09/22/2016   Preventative health care 09/22/2016    Past Surgical History:  Procedure Laterality Date   CERVICAL ABLATION  2007   CERVICAL SPINE SURGERY  2014   COLONOSCOPY WITH PROPOFOL N/A 10/19/2018   Procedure: COLONOSCOPY WITH BIOPSY;  Surgeon: [name], MD;  Location: [place];  Service: Endoscopy;  Laterality: N/A;   POLYPECTOMY N/A 10/19/2018   Procedure: POLYPECTOMY;  Surgeon: [name], MD;  Location: [place];  Service: Endoscopy;  Laterality: N/A;   SEPTOPLASTY  1987    OB History   No obstetric history on file.      Home Medications    Prior to Admission medications   Medication Sig Start Date End Date Taking? Authorizing Provider  acyclovir (ZOVIRAX) 400 MG tablet TAKE 1 TABLET (400 MG TOTAL) BY MOUTH 2 (TWO) TIMES DAILY. FOR HERPES PREVENTION. 09/05/20   Pleas Koch, NP  aspirin-acetaminophen-caffeine (EXCEDRIN MIGRAINE) [phone] MG tablet Take 1 tablet by mouth every 6 (six) hours as  needed for headache.    [provider]  DULoxetine (CYMBALTA) 30 MG capsule Take 1 capsule (30 mg total) by mouth daily. For anxiety and depression. Take with 60 mg capsule. 07/29/20   Pleas Koch, NP  DULoxetine (CYMBALTA) 60 MG capsule TAKE 1 CAPSULE (60 MG TOTAL) BY MOUTH DAILY. FOR ANXIETY AND DEPRESSION 08/14/20   Pleas Koch, NP  propranolol ER (INDERAL LA) 80 MG 24 hr capsule TAKE 2 CAPSULES (160 MG TOTAL) BY MOUTH DAILY. FOR HEADACHE PREVENTION. 09/04/20   Pleas Koch, NP  rizatriptan (MAXALT) 10 MG tablet Take 1 tablet by mouth at migraine onset, may repeat in 2 hours if needed 07/29/20    Pleas Koch, NP    Family History Family History  Problem Relation Age of Onset   Arthritis Mother    Breast cancer Mother 83   Heart disease Father    Arthritis Maternal Grandfather    Lung cancer Maternal Grandfather     Social History Social History   Tobacco Use   Smoking status: Light Smoker    Packs/day: 1.00    Years: 19.00    Pack years: 19.00    Types: Cigarettes   Smokeless tobacco: Never  Vaping Use   Vaping Use: Never used  Substance Use Topics   Alcohol use: Yes    Comment: rare   Drug use: No     Allergies   Azithromycin, Erythromycin, and Prednisone   Review of Systems Review of Systems  As stated above in HPI Physical Exam Triage Vital Signs ED Triage Vitals [09/29/20 1533]  Enc Vitals Group     BP (!) 147/95     Pulse Rate (!) 118     Resp 20     Temp 98 F (36.7 C)     Temp src      SpO2      Weight      Height      Head Circumference      Peak Flow      Pain Score      Pain Loc      Pain Edu?      Excl. in [place]?    No data found.  Updated Vital Signs BP (!) 147/95   Pulse (!) 118   Temp 98 F (36.7 C)   Resp 20   Physical Exam Vitals and nursing note reviewed.  Constitutional:      General: She is in acute distress.     Appearance: Normal appearance. She is ill-appearing, toxic-appearing and diaphoretic.  HENT:     Head: Normocephalic and atraumatic.     Mouth/Throat:     Pharynx: Oropharynx is clear.  Eyes:     Extraocular Movements: Extraocular movements intact.     Pupils: Pupils are equal, round, and reactive to light.  Cardiovascular:     Rate and Rhythm: Normal rate and regular rhythm.     Heart sounds: Normal heart sounds.  Pulmonary:     Effort: Pulmonary effort is normal.     Breath sounds: Normal breath sounds.  Abdominal:     General: Bowel sounds are normal.     Palpations: There is no mass.     Tenderness: There is abdominal tenderness (throughout but worsened in periumbilical area). There  is guarding and rebound. There is no right CVA tenderness or left CVA tenderness.     Hernia: No hernia is present.  Musculoskeletal:     Cervical back: Neck supple.  Lymphadenopathy:  Cervical: No cervical adenopathy.  Skin:    General: Skin is warm.     Coloration: Skin is not jaundiced or pale.     Findings: No bruising or erythema.  Neurological:     Mental Status: She is alert and oriented to person, place, and time.     UC Treatments / Results  Labs (all labs ordered are listed, but only abnormal results are displayed) Labs Reviewed  URINALYSIS, COMPLETE (UACMP) WITH MICROSCOPIC    EKG   Radiology No results found.  Procedures Procedures (including critical care time)  Medications Ordered in UC Medications - No data to display  Initial Impression / Assessment and Plan / UC Course  I have reviewed the triage vital signs and the nursing notes.  Pertinent labs & imaging results that were available during my care of the patient were reviewed by me and considered in my medical decision making (see chart for details).     New.  I am concerned that she may have appendicitis versus return of her urosepsis.  Her urinalysis shows mild abnormalities however her symptoms are fairly significant.  She is tachycardic around 125 on auscultation with respiratory rate 22.  She stated that she had taken Advil about an hour before arriving so I suspect this is why she does not have a fever in office right now.  I discussed with patient that given her recent hospitalization for sepsis and current symptoms I want her to go to the emergency room for further work-up.  She is agreeable.  She prefers to have her father take her to the emergency room.  She will be n.p.o. Final Clinical Impressions(s) / UC Diagnoses   Final diagnoses:  None   Discharge Instructions   None    ED Prescriptions   None    PDMP not reviewed this encounter.   Hughie Closs, [place] 09/29/20  1601

## 2020-09-30 ENCOUNTER — Encounter: Payer: BC Managed Care – PPO | Admitting: Primary Care

## 2020-10-09 ENCOUNTER — Other Ambulatory Visit: Payer: Self-pay

## 2020-10-09 ENCOUNTER — Ambulatory Visit
Admission: RE | Admit: 2020-10-09 | Discharge: 2020-10-09 | Disposition: A | Discharge status: Home / Self Care | Payer: BC Managed Care – PPO | Source: Ambulatory Visit | Attending: Physician Assistant | Admitting: Physician Assistant

## 2020-10-09 VITALS — BP 128/86 | HR 101 | Temp 98.6°F | Resp 16 | Ht 63.0 in | Wt 200.0 lb

## 2020-10-09 DIAGNOSIS — R300 Dysuria: Secondary | ICD-10-CM

## 2020-10-09 LAB — COMPREHENSIVE METABOLIC PANEL
ALT: 16 U/L (ref 0–44)
AST: 12 U/L — ABNORMAL LOW (ref 15–41)
Albumin: 3.6 g/dL (ref 3.5–5.0)
Alkaline Phosphatase: 76 U/L (ref 38–126)
Anion gap: 6 (ref 5–15)
BUN: 15 mg/dL (ref 6–20)
CO2: 26 mmol/L (ref 22–32)
Calcium: 9 mg/dL (ref 8.9–10.3)
Chloride: 103 mmol/L (ref 98–111)
Creatinine, Ser: 0.89 mg/dL (ref 0.44–1.00)
GFR, Estimated: >60 mL/min (ref >60)
Glucose, Bld: 104 mg/dL — ABNORMAL HIGH (ref 70–99)
Potassium: 3.7 mmol/L (ref 3.5–5.1)
Sodium: 135 mmol/L (ref 135–145)
Total Bilirubin: 0.4 mg/dL (ref 0.3–1.2)
Total Protein: 7.3 g/dL (ref 6.5–8.1)

## 2020-10-09 LAB — URINALYSIS, COMPLETE (UACMP) WITH MICROSCOPIC
Bilirubin Urine: NEGATIVE
Glucose, UA: NEGATIVE mg/dL
Hgb urine dipstick: NEGATIVE
Ketones, ur: NEGATIVE mg/dL
Leukocytes,Ua: NEGATIVE
Nitrite: NEGATIVE
Protein, ur: NEGATIVE mg/dL
Specific Gravity, Urine: 1.025 (ref 1.005–1.030)
pH: 5.5 (ref 5.0–8.0)

## 2020-10-09 LAB — CBC WITH DIFFERENTIAL/PLATELET
Abs Immature Granulocytes: 0.26 10*3/uL — ABNORMAL HIGH (ref 0.00–0.07)
Basophils Absolute: 0.1 10*3/uL (ref 0.0–0.1)
Basophils Relative: 1 %
Eosinophils Absolute: 0.3 10*3/uL (ref 0.0–0.5)
Eosinophils Relative: 2 %
HCT: 29.5 % — ABNORMAL LOW (ref 36.0–46.0)
Hemoglobin: 9.9 g/dL — ABNORMAL LOW (ref 12.0–15.0)
Immature Granulocytes: 2 %
Lymphocytes Relative: 31 %
Lymphs Abs: 4.1 10*3/uL — ABNORMAL HIGH (ref 0.7–4.0)
MCH: 30.4 pg (ref 26.0–34.0)
MCHC: 33.6 g/dL (ref 30.0–36.0)
MCV: 90.5 fL (ref 80.0–100.0)
Monocytes Absolute: 1 10*3/uL (ref 0.1–1.0)
Monocytes Relative: 8 %
Neutro Abs: 7.5 10*3/uL (ref 1.7–7.7)
Neutrophils Relative %: 56 %
Platelets: 562 10*3/uL — ABNORMAL HIGH (ref 150–400)
RBC: 3.26 MIL/uL — ABNORMAL LOW (ref 3.87–5.11)
RDW: 13.5 % (ref 11.5–15.5)
WBC: 13.1 10*3/uL — ABNORMAL HIGH (ref 4.0–10.5)
nRBC: 0 % (ref 0.0–0.2)

## 2020-10-09 MED ORDER — SULFAMETHOXAZOLE-TRIMETHOPRIM 800-160 MG PO TABS
1.0000 | ORAL_TABLET | Freq: Two times a day (BID) | ORAL | 0 refills | Status: AC
Start: 2020-10-09 — End: 2020-10-14

## 2020-10-09 NOTE — ED Provider Notes (Signed)
[facility]    CSN: [id number] Arrival date & time: 10/09/20  1344      History   Chief Complaint Dysuria   HPI Brittany Pham is a 55 y.o. female.   HPI  Dysuria: Pt reports that she has had dysuria for the past 1 days.  She was recently treated for pyelonephritis on 09/15/2020 in the emergency room. Given Omnicef for symptoms which patient reports cleared infection. She also was then treated last week for appendicitis with surgical appendectomy. A foley catheter was placed during the procedure.  She reports that for the past 3 days she has felt clammy, fatigued, has had a little bit of suprapubic abdominal pain and mild nausea.  No fevers,lack of bowel movements or vomiting. She has not tried anything for symptoms.   Past Medical History:  Diagnosis Date   Arthritis    Chickenpox    Complication of anesthesia    nausea after septoplasty   Depression    GERD (gastroesophageal reflux disease)    Migraines    2-3 per month   PONV (postoperative nausea and vomiting)    Post-viral cough syndrome 05/16/2018    Patient Active Problem List   Diagnosis Date Noted   OSA (obstructive sleep apnea) 05/01/2019   External hemorrhoid 05/01/2019   Encounter for screening colonoscopy    Polyp of ascending colon    Rectal polyp    Myopia of both eyes 05/09/2018   Presbyopia 05/09/2018   Genital herpes 09/19/2017   Frequent headaches 09/19/2017   Stress incontinence of urine 09/19/2017   Prediabetes 09/19/2017   Menopausal symptoms 09/19/2017   Migraines 09/22/2016   Chronic neck pain 09/22/2016   Anxiety and depression 09/22/2016   Preventative health care 09/22/2016    Past Surgical History:  Procedure Laterality Date   CERVICAL ABLATION  2007   CERVICAL SPINE SURGERY  2014   COLONOSCOPY WITH PROPOFOL N/A 10/19/2018   Procedure: COLONOSCOPY WITH BIOPSY;  Surgeon: [name], MD;  Location: [place];  Service: Endoscopy;  Laterality: N/A;    POLYPECTOMY N/A 10/19/2018   Procedure: POLYPECTOMY;  Surgeon: [name], MD;  Location: [place];  Service: Endoscopy;  Laterality: N/A;   SEPTOPLASTY  1987    OB History   No obstetric history on file.      Home Medications    Prior to Admission medications   Medication Sig Start Date End Date Taking? Authorizing Provider  acyclovir (ZOVIRAX) 400 MG tablet TAKE 1 TABLET (400 MG TOTAL) BY MOUTH 2 (TWO) TIMES DAILY. FOR HERPES PREVENTION. 09/05/20  Yes Pleas Koch, NP  aspirin-acetaminophen-caffeine (EXCEDRIN MIGRAINE) [phone] MG tablet Take 1 tablet by mouth every 6 (six) hours as needed for headache.   Yes [provider]  DULoxetine (CYMBALTA) 30 MG capsule Take 1 capsule (30 mg total) by mouth daily. For anxiety and depression. Take with 60 mg capsule. 07/29/20  Yes Pleas Koch, NP  DULoxetine (CYMBALTA) 60 MG capsule TAKE 1 CAPSULE (60 MG TOTAL) BY MOUTH DAILY. FOR ANXIETY AND DEPRESSION 08/14/20  Yes Pleas Koch, NP  propranolol ER (INDERAL LA) 80 MG 24 hr capsule TAKE 2 CAPSULES (160 MG TOTAL) BY MOUTH DAILY. FOR HEADACHE PREVENTION. 09/04/20  Yes Pleas Koch, NP  rizatriptan (MAXALT) 10 MG tablet Take 1 tablet by mouth at migraine onset, may repeat in 2 hours if needed 07/29/20  Yes Pleas Koch, NP    Family History Family History  Problem Relation Age of  Onset   Arthritis Mother    Breast cancer Mother 93   Heart disease Father    Arthritis Maternal Grandfather    Lung cancer Maternal Grandfather     Social History Social History   Tobacco Use   Smoking status: Light Smoker    Packs/day: 1.00    Years: 19.00    Pack years: 19.00    Types: Cigarettes   Smokeless tobacco: Never  Vaping Use   Vaping Use: Never used  Substance Use Topics   Alcohol use: Yes    Comment: rare   Drug use: No     Allergies   Azithromycin, Erythromycin, and Prednisone   Review of Systems Review of Systems  As stated above  in HPI Physical Exam Triage Vital Signs ED Triage Vitals  Enc Vitals Group     BP 10/09/20 1405 128/86     Pulse Rate 10/09/20 1405 (!) 101     Resp 10/09/20 1405 16     Temp 10/09/20 1405 98.6 F (37 C)     Temp Source 10/09/20 1405 Oral     SpO2 10/09/20 1405 96 %     Weight 10/09/20 1403 200 lb (90.7 kg)     Height 10/09/20 1403 '5\' 3"'$  (1.6 m)     Head Circumference --      Peak Flow --      Pain Score 10/09/20 1403 2     Pain Loc --      Pain Edu? --      Excl. in [place]? --    No data found.  Updated Vital Signs BP 128/86 (BP Location: Left Arm)   Pulse (!) 101   Temp 98.6 F (37 C) (Oral)   Resp 16   Ht '5\' 3"'$  (1.6 m)   Wt 200 lb (90.7 kg)   SpO2 96%   BMI 35.43 kg/m   Physical Exam Vitals and nursing note reviewed.  Constitutional:      General: She is not in acute distress.    Appearance: Normal appearance. She is diaphoretic (mild). She is not ill-appearing or toxic-appearing.  Cardiovascular:     Rate and Rhythm: Normal rate and regular rhythm.     Heart sounds: Normal heart sounds.  Pulmonary:     Effort: Pulmonary effort is normal.     Breath sounds: Normal breath sounds.  Abdominal:     General: Bowel sounds are normal. There is distension.     Palpations: Abdomen is soft.     Tenderness: There is no abdominal tenderness. There is no guarding.  Skin:    Coloration: Skin is jaundiced (mild).  Neurological:     Mental Status: She is alert.    UC Treatments / Results  Labs (all labs ordered are listed, but only abnormal results are displayed) Labs Reviewed  URINALYSIS, COMPLETE (UACMP) WITH MICROSCOPIC - Abnormal; Notable for the following components:      Result Value   APPearance HAZY (*)    Bacteria, UA FEW (*)    All other components within normal limits  URINE CULTURE  CBC WITH DIFFERENTIAL/PLATELET  COMPREHENSIVE METABOLIC PANEL    EKG   Radiology No results found.  Procedures Procedures (including critical care  time)  Medications Ordered in UC Medications - No data to display  Initial Impression / Assessment and Plan / UC Course  I have reviewed the triage vital signs and the nursing notes.  Pertinent labs & imaging results that were available during my care of the  patient were reviewed by me and considered in my medical decision making (see chart for details).     New.  Likely acute cystitis secondary to the Foley catheter used during the surgery.  Luckily her CMP is reassuring.  She does have slight white blood cell count abnormality with left shift and with the bacteria seen in urine along with cloudiness I am going to start her on Bactrim.  Discussed how to use along, potential side effects and precautions.  I want her to rest and stay hydrated.  Low threshold for going to the ER should symptoms worsen.  We discussed red flag signs and symptoms.  I want her to follow-up with her surgeon regarding her symptoms as well.  I do want her to follow-up in 2 days for repeat CBC to ensure that the WBC count is going down. Final Clinical Impressions(s) / UC Diagnoses   Final diagnoses:  None   Discharge Instructions   None    ED Prescriptions   None    PDMP not reviewed this encounter.   Hughie Closs, [place] 10/09/20 1518

## 2020-10-09 NOTE — ED Triage Notes (Signed)
Pt here with C/O pain with urination.   Had double kidney infection couple a weeks ago. Pt had surgery last week on appendix's. Left message with surgeon about 3 hours ago no call back.

## 2020-10-09 NOTE — Discharge Instructions (Addendum)
I would like for you to have a repeat CBC in 2 days.

## 2020-10-11 ENCOUNTER — Ambulatory Visit: Payer: Self-pay

## 2020-10-12 LAB — URINE CULTURE: Culture: 70000 — AB

## 2020-10-13 NOTE — Telephone Encounter (Signed)
Please call patient

## 2020-10-13 NOTE — Telephone Encounter (Signed)
Spoke to patient by telephone and was advised that she has not been feeling well since before her surgery. Patient stated that she has been nauseated and sweating at times. Patient denies a fever. Patient stated that she would like to be seen to see if there is anything else going on with her. Patient stated that she is seeing the surgeon today for a follow-up. Patient stated that she will talk with the surgeon about the ongoing symptoms that she is having. Patient scheduled for an appointment tomorrow 10/14/20 with Dr. Einar Pheasant at 12:20 pm. Patient was given ER precautions and she verbalized understanding.

## 2020-10-13 NOTE — Telephone Encounter (Signed)
Noted agree with appointment

## 2020-10-14 ENCOUNTER — Ambulatory Visit: Payer: BC Managed Care – PPO | Admitting: Family Medicine

## 2020-10-25 ENCOUNTER — Other Ambulatory Visit: Payer: Self-pay | Admitting: Primary Care

## 2020-10-25 DIAGNOSIS — F419 Anxiety disorder, unspecified: Secondary | ICD-10-CM

## 2020-10-25 DIAGNOSIS — F32A Depression, unspecified: Secondary | ICD-10-CM

## 2020-11-18 ENCOUNTER — Encounter: Payer: BC Managed Care – PPO | Admitting: Primary Care

## 2020-11-29 ENCOUNTER — Other Ambulatory Visit: Payer: Self-pay | Admitting: Primary Care

## 2020-11-29 DIAGNOSIS — F419 Anxiety disorder, unspecified: Secondary | ICD-10-CM

## 2020-11-29 DIAGNOSIS — F32A Depression, unspecified: Secondary | ICD-10-CM

## 2020-11-30 NOTE — Telephone Encounter (Signed)
Please call patient:  Is she doing okay with the additional 30 mg of Cymbalta along with the 60 mg she is taking? If doing okay then okay to send in as pended.   She no showed her CPE for 11/18/20, needs to reschedule.

## 2020-12-01 NOTE — Telephone Encounter (Signed)
Left message to return call to our office.  

## 2020-12-02 ENCOUNTER — Other Ambulatory Visit: Payer: Self-pay | Admitting: Primary Care

## 2020-12-02 DIAGNOSIS — G43909 Migraine, unspecified, not intractable, without status migrainosus: Secondary | ICD-10-CM

## 2020-12-05 NOTE — Telephone Encounter (Signed)
Left message to return call to our office.  

## 2020-12-07 ENCOUNTER — Other Ambulatory Visit: Payer: Self-pay | Admitting: Primary Care

## 2020-12-07 DIAGNOSIS — A6000 Herpesviral infection of urogenital system, unspecified: Secondary | ICD-10-CM

## 2020-12-07 DIAGNOSIS — R519 Headache, unspecified: Secondary | ICD-10-CM

## 2020-12-07 NOTE — Telephone Encounter (Signed)
Patient is due for her CPE. I believe I already notified either you or the [facility] support pool of this need. She no showed her CPE on 11/18/20.

## 2020-12-08 NOTE — Telephone Encounter (Signed)
Left message to return call to our office.  

## 2020-12-15 NOTE — Telephone Encounter (Signed)
Called patient and sent my chart to call office.

## 2020-12-15 NOTE — Telephone Encounter (Signed)
Please advise is that ok or do you want to see sooner.

## 2020-12-18 NOTE — Telephone Encounter (Signed)
Patient made appt for 02/25/21 at 7:20 am.

## 2020-12-19 NOTE — Telephone Encounter (Signed)
Pt has made appointment for physical

## 2020-12-31 ENCOUNTER — Other Ambulatory Visit: Payer: Self-pay | Admitting: Primary Care

## 2020-12-31 DIAGNOSIS — F32A Depression, unspecified: Secondary | ICD-10-CM

## 2020-12-31 DIAGNOSIS — G43909 Migraine, unspecified, not intractable, without status migrainosus: Secondary | ICD-10-CM

## 2020-12-31 DIAGNOSIS — F419 Anxiety disorder, unspecified: Secondary | ICD-10-CM

## 2021-01-01 NOTE — Telephone Encounter (Signed)
She is not due for any of these refills. Can you find out what's going on?

## 2021-01-01 NOTE — Telephone Encounter (Signed)
Refills for duloxetine 30 mg were sent on 12/19/20 for a 90-day supply.  Refills for rizatriptan were sent on 12/03/2020.  Did she check with her pharmacy? Can we find out what is going on?

## 2021-01-01 NOTE — Telephone Encounter (Signed)
Patient Brittany Pham called back and stated that she is out Duloxentine 30mg  and Rizatriptan 10mg .

## 2021-01-01 NOTE — Telephone Encounter (Signed)
Tried calling the patient and he did not answer. LVM for patient to call back.   

## 2021-01-04 ENCOUNTER — Other Ambulatory Visit: Payer: Self-pay | Admitting: Primary Care

## 2021-01-04 DIAGNOSIS — R519 Headache, unspecified: Secondary | ICD-10-CM

## 2021-01-04 DIAGNOSIS — A6000 Herpesviral infection of urogenital system, unspecified: Secondary | ICD-10-CM

## 2021-01-27 ENCOUNTER — Ambulatory Visit: Payer: BC Managed Care – PPO | Admitting: Primary Care

## 2021-01-28 ENCOUNTER — Encounter: Payer: Self-pay | Admitting: Primary Care

## 2021-02-25 ENCOUNTER — Ambulatory Visit: Payer: BC Managed Care – PPO | Admitting: Primary Care

## 2021-03-12 ENCOUNTER — Other Ambulatory Visit: Payer: Self-pay | Admitting: Primary Care

## 2021-03-12 DIAGNOSIS — F419 Anxiety disorder, unspecified: Secondary | ICD-10-CM

## 2021-03-12 MED ORDER — DULOXETINE HCL 60 MG PO CPEP: 60.0000 mg | ORAL_CAPSULE | Freq: Every day | ORAL | 0 refills | Status: DC

## 2021-03-12 MED ORDER — DULOXETINE HCL 30 MG PO CPEP: 30.0000 mg | ORAL_CAPSULE | Freq: Every day | ORAL | 0 refills | Status: DC

## 2021-04-02 ENCOUNTER — Other Ambulatory Visit: Payer: Self-pay | Admitting: Primary Care

## 2021-04-02 DIAGNOSIS — R519 Headache, unspecified: Secondary | ICD-10-CM

## 2021-04-02 NOTE — Telephone Encounter (Signed)
Mychart sent to pt.

## 2021-04-02 NOTE — Telephone Encounter (Signed)
Please verify if patient has been taking propranolol 80 mg 1 capsule or 2 capsules for headache prevention. Received refill request for 1 capsule daily but my notes say 2 daily.

## 2021-04-03 ENCOUNTER — Other Ambulatory Visit: Payer: Self-pay | Admitting: Primary Care

## 2021-04-03 DIAGNOSIS — A6000 Herpesviral infection of urogenital system, unspecified: Secondary | ICD-10-CM

## 2021-04-06 NOTE — Telephone Encounter (Signed)
Do not see reply from patient on my chart. I have called left message to call office.

## 2021-04-13 ENCOUNTER — Other Ambulatory Visit: Payer: Self-pay | Admitting: Primary Care

## 2021-04-13 DIAGNOSIS — Z1231 Encounter for screening mammogram for malignant neoplasm of breast: Secondary | ICD-10-CM

## 2021-04-13 NOTE — Telephone Encounter (Signed)
Have not received message or call back from patient.

## 2021-04-15 ENCOUNTER — Other Ambulatory Visit: Payer: Self-pay | Admitting: Primary Care

## 2021-04-15 DIAGNOSIS — G43909 Migraine, unspecified, not intractable, without status migrainosus: Secondary | ICD-10-CM

## 2021-04-22 ENCOUNTER — Encounter: Payer: BC Managed Care – PPO | Admitting: Primary Care

## 2021-04-22 ENCOUNTER — Other Ambulatory Visit: Payer: Self-pay

## 2021-04-27 ENCOUNTER — Other Ambulatory Visit: Payer: Self-pay | Admitting: Primary Care

## 2021-04-27 DIAGNOSIS — R519 Headache, unspecified: Secondary | ICD-10-CM

## 2021-04-28 NOTE — Telephone Encounter (Signed)
Did we ever get in touch with patient regarding how many propranolol capsuels she's taking? 1 or 2 daily?  I think [facility] has been sending the wrong refill request.  We will also see her soon in the office to clear this up.

## 2021-04-29 NOTE — Telephone Encounter (Signed)
No we were not able to reach. I have called and left another message. Last time we called several times and sent my chart with no reply. Do you want me to continue to call or mail letter to call office  ?

## 2021-04-29 NOTE — Telephone Encounter (Signed)
We can await her call back. ?We will see her in 1 week as well.  ?

## 2021-04-30 ENCOUNTER — Ambulatory Visit
Admission: RE | Admit: 2021-04-30 | Discharge: 2021-04-30 | Disposition: A | Discharge status: Home / Self Care | Payer: BC Managed Care – PPO | Source: Ambulatory Visit | Attending: Primary Care | Admitting: Primary Care

## 2021-04-30 ENCOUNTER — Other Ambulatory Visit: Payer: Self-pay

## 2021-04-30 DIAGNOSIS — Z1231 Encounter for screening mammogram for malignant neoplasm of breast: Secondary | ICD-10-CM

## 2021-05-06 ENCOUNTER — Other Ambulatory Visit (HOSPITAL_COMMUNITY)
Admission: RE | Admit: 2021-05-06 | Discharge: 2021-05-06 | Disposition: A | Discharge status: Home / Self Care | Payer: BC Managed Care – PPO | Source: Ambulatory Visit | Attending: Primary Care | Admitting: Primary Care

## 2021-05-06 ENCOUNTER — Other Ambulatory Visit: Payer: Self-pay

## 2021-05-06 ENCOUNTER — Ambulatory Visit (INDEPENDENT_AMBULATORY_CARE_PROVIDER_SITE_OTHER): Payer: BC Managed Care – PPO | Admitting: Primary Care

## 2021-05-06 ENCOUNTER — Encounter: Payer: Self-pay | Admitting: Primary Care

## 2021-05-06 VITALS — BP 124/78 | HR 98 | Temp 98.6°F | Ht 63.0 in | Wt 216.0 lb

## 2021-05-06 DIAGNOSIS — G4733 Obstructive sleep apnea (adult) (pediatric): Secondary | ICD-10-CM

## 2021-05-06 DIAGNOSIS — A6000 Herpesviral infection of urogenital system, unspecified: Secondary | ICD-10-CM

## 2021-05-06 DIAGNOSIS — F32A Depression, unspecified: Secondary | ICD-10-CM

## 2021-05-06 DIAGNOSIS — R7303 Prediabetes: Secondary | ICD-10-CM

## 2021-05-06 DIAGNOSIS — Z124 Encounter for screening for malignant neoplasm of cervix: Secondary | ICD-10-CM

## 2021-05-06 DIAGNOSIS — G43709 Chronic migraine without aura, not intractable, without status migrainosus: Secondary | ICD-10-CM

## 2021-05-06 DIAGNOSIS — R519 Headache, unspecified: Secondary | ICD-10-CM

## 2021-05-06 DIAGNOSIS — F419 Anxiety disorder, unspecified: Secondary | ICD-10-CM

## 2021-05-06 DIAGNOSIS — Z0000 Encounter for general adult medical examination without abnormal findings: Secondary | ICD-10-CM

## 2021-05-06 LAB — BASIC METABOLIC PANEL
BUN: 11 mg/dL (ref 6–23)
CO2: 28 mEq/L (ref 19–32)
Calcium: 9.3 mg/dL (ref 8.4–10.5)
Chloride: 102 mEq/L (ref 96–112)
Creatinine, Ser: 0.86 mg/dL (ref 0.40–1.20)
GFR: 76.84 mL/min (ref >60.00)
Glucose, Bld: 101 mg/dL — ABNORMAL HIGH (ref 70–99)
Potassium: 4.2 mEq/L (ref 3.5–5.1)
Sodium: 138 mEq/L (ref 135–145)

## 2021-05-06 LAB — LIPID PANEL
Cholesterol: 220 mg/dL — ABNORMAL HIGH (ref 0–200)
HDL: 48.8 mg/dL (ref >39.00)
LDL Cholesterol: 135 mg/dL — ABNORMAL HIGH (ref 0–99)
NonHDL: 170.8
Total CHOL/HDL Ratio: 5
Triglycerides: 180 mg/dL — ABNORMAL HIGH (ref 0.0–149.0)
VLDL: 36 mg/dL (ref 0.0–40.0)

## 2021-05-06 LAB — HEMOGLOBIN A1C: Hgb A1c MFr Bld: 6.2 % (ref 4.6–6.5)

## 2021-05-06 NOTE — Assessment & Plan Note (Signed)
Overall controlled. ? ?Continue duloxetine 90 mg daily. ?

## 2021-05-06 NOTE — Assessment & Plan Note (Signed)
Controlled. ? ?Continue acyclovir 400 BID. ?

## 2021-05-06 NOTE — Progress Notes (Signed)
? ?Subjective:  ? ? Patient ID: Brittany Pham, female    DOB: 09/16/1965, 56 y.o.   MRN: [id number] ? ?HPI ? ?Brittany Pham is a very pleasant 56 y.o. female who presents today for complete physical and follow up of chronic conditions. ? ?She continues to experience migraines, suspects this is largely due to stress. She has a lot of stress in her personal life with her step children. Frequent headaches occur 3-4 times monthly. Migraines occur once weekly on average. She was placed on propranolol 80 mg and then 160 mg without improvement. She was once managed on Topamax which altered her taste.  ? ?Immunizations: ?-Tetanus: 2016 ?-Influenza: Did not complete this season  ?-Covid-19: Has not completed ?-Shingles: Completed 1 dose of Shingrix ? ?Diet: Fair diet.  ?Exercise: No regular exercise. ? ?Eye exam: Completes annually  ?Dental exam: Completes semi-annually  ? ?Pap Smear: Completed in July 2020 ?Mammogram: Completed in March 2023 ?Colonoscopy: Completed in 2020, due 2025 ? ?BP Readings from Last 3 Encounters:  ?05/06/21 124/78  ?10/09/20 128/86  ?09/29/20 (!) 147/95  ? ? ? ? ? ?Review of Systems  ?Constitutional:  Negative for unexpected weight change.  ?HENT:  Negative for rhinorrhea.   ?Eyes:  Negative for visual disturbance.  ?Respiratory:  Negative for cough and shortness of breath.   ?Cardiovascular:  Negative for chest pain.  ?Gastrointestinal:  Negative for constipation and diarrhea.  ?Genitourinary:  Negative for difficulty urinating.  ?Musculoskeletal:  Negative for arthralgias and myalgias.  ?Skin:  Negative for rash.  ?Allergic/Immunologic: Negative for environmental allergies.  ?Neurological:  Positive for headaches. Negative for dizziness.  ?Psychiatric/Behavioral:  The patient is nervous/anxious.   ? ?   ? ? ?Past Medical History:  ?Diagnosis Date  ? Arthritis   ? Chickenpox   ? Complication of anesthesia   ? nausea after septoplasty  ? Depression   ? GERD (gastroesophageal reflux disease)   ?  Migraines   ? 2-3 per month  ? PONV (postoperative nausea and vomiting)   ? Post-viral cough syndrome 05/16/2018  ? ? ?Social History  ? ?Socioeconomic History  ? Marital status: Married  ?  Spouse name: Not on file  ? Number of children: Not on file  ? Years of education: Not on file  ? Highest education level: Not on file  ?Occupational History  ? Not on file  ?Tobacco Use  ? Smoking status: Light Smoker  ?  Packs/day: 1.00  ?  Years: 19.00  ?  Pack years: 19.00  ?  Types: Cigarettes  ? Smokeless tobacco: Never  ?Vaping Use  ? Vaping Use: Never used  ?Substance and Sexual Activity  ? Alcohol use: Yes  ?  Comment: rare  ? Drug use: No  ? Sexual activity: Not on file  ?Other Topics Concern  ? Not on file  ?Social History Narrative  ? Married.  ? 2 children, 4 step children, 1 grandchild.   ? Works in [employer].   ? Enjoys swimming, spending time with family.   ? ?Social Determinants of Health  ? ?Financial Resource Strain: Not on file  ?Food Insecurity: Not on file  ?Transportation Needs: Not on file  ?Physical Activity: Not on file  ?Stress: Not on file  ?Social Connections: Not on file  ?Intimate Partner Violence: Not on file  ? ? ?Past Surgical History:  ?Procedure Laterality Date  ? CERVICAL ABLATION  2007  ? CERVICAL SPINE SURGERY  2014  ? COLONOSCOPY WITH PROPOFOL N/A 10/19/2018  ?  Procedure: COLONOSCOPY WITH BIOPSY;  Surgeon: [name], MD;  Location: [place];  Service: Endoscopy;  Laterality: N/A;  ? POLYPECTOMY N/A 10/19/2018  ? Procedure: POLYPECTOMY;  Surgeon: [name], MD;  Location: [place];  Service: Endoscopy;  Laterality: N/A;  ? SEPTOPLASTY  1987  ? ? ?Family History  ?Problem Relation Age of Onset  ? Arthritis Mother   ? Breast cancer Mother 89  ? Heart disease Father   ? Arthritis Maternal Grandfather   ? Lung cancer Maternal Grandfather   ? ? ?Allergies  ?Allergen Reactions  ? Azithromycin Nausea And Vomiting  ? Erythromycin Nausea And Vomiting  ? Prednisone Other (See  Comments)  ?  Passes out ? ?syncope  ? ? ?Current Outpatient Medications on File Prior to Visit  ?Medication Sig Dispense Refill  ? acyclovir (ZOVIRAX) 400 MG tablet TAKE 1 TABLET BY MOUTH TWICE A DAY 180 tablet 0  ? aspirin-acetaminophen-caffeine (EXCEDRIN MIGRAINE) 250-250-65 MG tablet Take 1 tablet by mouth every 6 (six) hours as needed for headache.    ? DULoxetine (CYMBALTA) 30 MG capsule Take 1 capsule (30 mg total) by mouth daily. For anxiety and depression. Take with 60 mg dose. 90 capsule 0  ? DULoxetine (CYMBALTA) 60 MG capsule Take 1 capsule (60 mg total) by mouth daily. For anxiety and depression. Take with 30 mg dose. 90 capsule 0  ? rizatriptan (MAXALT) 10 MG tablet TAKE 1 TABLET BY MOUTH AT MIGRAINE ONSET, MAY REPEAT IN 2 HOURS IF NEEDED 10 tablet 0  ? propranolol ER (INDERAL LA) 80 MG 24 hr capsule Take 1 capsule (80 mg total) by mouth daily. For headache prevention (Patient not taking: Reported on 05/06/2021) 30 capsule 0  ? ?No current facility-administered medications on file prior to visit.  ? ? ?BP 124/78   Pulse 98   Temp 98.6 ?F (37 ?C) (Oral)   Ht '5\' 3"'$  (1.6 m)   Wt 216 lb (98 kg)   SpO2 98%   BMI 38.26 kg/m?  ?Objective:  ? Physical Exam ?HENT:  ?   Right Ear: Tympanic membrane and ear canal normal.  ?   Left Ear: Tympanic membrane and ear canal normal.  ?   Nose: Nose normal.  ?Eyes:  ?   Conjunctiva/sclera: Conjunctivae normal.  ?   Pupils: Pupils are equal, round, and reactive to light.  ?Neck:  ?   Thyroid: No thyromegaly.  ?Cardiovascular:  ?   Rate and Rhythm: Normal rate and regular rhythm.  ?   Heart sounds: No murmur heard. ?Pulmonary:  ?   Effort: Pulmonary effort is normal.  ?   Breath sounds: Normal breath sounds. No rales.  ?Abdominal:  ?   General: Bowel sounds are normal.  ?   Palpations: Abdomen is soft.  ?   Tenderness: There is no abdominal tenderness.  ?Genitourinary: ?   Labia:     ?   Right: No rash, tenderness or lesion.     ?   Left: No rash, tenderness or lesion.    ?   Vagina: No vaginal discharge.  ?   Cervix: No cervical motion tenderness or discharge.  ?   Uterus: Normal.   ?   Adnexa: Right adnexa normal and left adnexa normal.  ?Musculoskeletal:     ?   General: Normal range of motion.  ?   Cervical back: Neck supple.  ?Lymphadenopathy:  ?   Cervical: No cervical adenopathy.  ?Skin: ?   General: Skin is warm  and dry.  ?   Findings: No rash.  ?Neurological:  ?   Mental Status: She is alert and oriented to person, place, and time.  ?   Cranial Nerves: No cranial nerve deficit.  ?   Deep Tendon Reflexes: Reflexes are normal and symmetric.  ?Psychiatric:     ?   Mood and Affect: Mood normal.  ? ? ? ? ? ?   ?Assessment & Plan:  ? ? ? ? ?This visit occurred during the SARS-CoV-2 public health emergency.  Safety protocols were in place, including screening questions prior to the visit, additional usage of staff PPE, and extensive cleaning of exam room while observing appropriate contact time as indicated for disinfecting solutions.  ?

## 2021-05-06 NOTE — Assessment & Plan Note (Addendum)
Chronic, continues to experience frequent migraines. ?No improvement with propranolol, Topamax caused altered taste. ? ?Consider nortriptyline 10 mg at bedtime for prevention.  She would like to hold off for now as the source of her migraines is personal stress that should reduce next month. ? ?Continue rizatriptan 10 mg as needed. ? ?She will update. ?

## 2021-05-06 NOTE — Assessment & Plan Note (Signed)
Compliant to CPAP nightly, continue same.  

## 2021-05-06 NOTE — Assessment & Plan Note (Signed)
Discussed the importance of a healthy diet and regular exercise in order for weight loss, and to reduce the risk of further co-morbidity. ? ?Repeat A1C pending. ?

## 2021-05-06 NOTE — Assessment & Plan Note (Signed)
Continued, more frequently with migraines. ? ?Topamax caused altered taste, propanolol ineffective.  Consider nortriptyline 10 mg at bedtime, she will notify if she would like to try. ?

## 2021-05-06 NOTE — Patient Instructions (Signed)
Stop by the lab prior to leaving today. I will notify you of your results once received.  ? ?It was a pleasure to see you today! ? ?Preventive Care 54-54 Years Old, Female ?Preventive care refers to lifestyle choices and visits with your health care provider that can promote health and wellness. Preventive care visits are also called wellness exams. ?What can I expect for my preventive care visit? ?Counseling ?Your health care provider may ask you questions about your: ?Medical history, including: ?Past medical problems. ?Family medical history. ?Pregnancy history. ?Current health, including: ?Menstrual cycle. ?Method of birth control. ?Emotional well-being. ?Home life and relationship well-being. ?Sexual activity and sexual health. ?Lifestyle, including: ?Alcohol, nicotine or tobacco, and drug use. ?Access to firearms. ?Diet, exercise, and sleep habits. ?Work and work environment. ?Sunscreen use. ?Safety issues such as seatbelt and bike helmet use. ?Physical exam ?Your health care provider will check your: ?Height and weight. These may be used to calculate your BMI (body mass index). BMI is a measurement that tells if you are at a healthy weight. ?Waist circumference. This measures the distance around your waistline. This measurement also tells if you are at a healthy weight and may help predict your risk of certain diseases, such as type 2 diabetes and high blood pressure. ?Heart rate and blood pressure. ?Body temperature. ?Skin for abnormal spots. ?What immunizations do I need? ?Vaccines are usually given at various ages, according to a schedule. Your health care provider will recommend vaccines for you based on your age, medical history, and lifestyle or other factors, such as travel or where you work. ?What tests do I need? ?Screening ?Your health care provider may recommend screening tests for certain conditions. This may include: ?Lipid and cholesterol levels. ?Diabetes screening. This is done by checking  your blood sugar (glucose) after you have not eaten for a while (fasting). ?Pelvic exam and Pap test. ?Hepatitis B test. ?Hepatitis C test. ?HIV (human immunodeficiency virus) test. ?STI (sexually transmitted infection) testing, if you are at risk. ?Lung cancer screening. ?Colorectal cancer screening. ?Mammogram. Talk with your health care provider about when you should start having regular mammograms. This may depend on whether you have a family history of breast cancer. ?BRCA-related cancer screening. This may be done if you have a family history of breast, ovarian, tubal, or peritoneal cancers. ?Bone density scan. This is done to screen for osteoporosis. ?Talk with your health care provider about your test results, treatment options, and if necessary, the need for more tests. ?Follow these instructions at home: ?Eating and drinking ? ?Eat a diet that includes fresh fruits and vegetables, whole grains, lean protein, and low-fat dairy products. ?Take vitamin and mineral supplements as recommended by your health care provider. ?Do not drink alcohol if: ?Your health care provider tells you not to drink. ?You are pregnant, may be pregnant, or are planning to become pregnant. ?If you drink alcohol: ?Limit how much you have to 0-1 drink a day. ?Know how much alcohol is in your drink. In the [place] one drink equals one 12 oz bottle of beer (355 mL), one 5 oz glass of wine (148 mL), or one 1? oz glass of hard liquor (44 mL). ?Lifestyle ?Brush your teeth every morning and night with fluoride toothpaste. Floss one time each day. ?Exercise for at least 30 minutes 5 or more days each week. ?Do not use any products that contain nicotine or tobacco. These products include cigarettes, chewing tobacco, and vaping devices, such as e-cigarettes. If you need help   quitting, ask your health care provider. ?Do not use drugs. ?If you are sexually active, practice safe sex. Use a condom or other form of protection to prevent STIs. ?If you  do not wish to become pregnant, use a form of birth control. If you plan to become pregnant, see your health care provider for a prepregnancy visit. ?Take aspirin only as told by your health care provider. Make sure that you understand how much to take and what form to take. Work with your health care provider to find out whether it is safe and beneficial for you to take aspirin daily. ?Find healthy ways to manage stress, such as: ?Meditation, yoga, or listening to music. ?Journaling. ?Talking to a trusted person. ?Spending time with friends and family. ?Minimize exposure to UV radiation to reduce your risk of skin cancer. ?Safety ?Always wear your seat belt while driving or riding in a vehicle. ?Do not drive: ?If you have been drinking alcohol. Do not ride with someone who has been drinking. ?When you are tired or distracted. ?While texting. ?If you have been using any mind-altering substances or drugs. ?Wear a helmet and other protective equipment during sports activities. ?If you have firearms in your house, make sure you follow all gun safety procedures. ?Seek help if you have been physically or sexually abused. ?What's next? ?Visit your health care provider once a year for an annual wellness visit. ?Ask your health care provider how often you should have your eyes and teeth checked. ?Stay up to date on all vaccines. ?This information is not intended to replace advice given to you by your health care provider. Make sure you discuss any questions you have with your health care provider. ?Document Revised: 08/13/2020 Document Reviewed: 08/13/2020 ?Elsevier Patient Education ? 2022 Elsevier Inc. ? ?

## 2021-05-06 NOTE — Assessment & Plan Note (Signed)
Tetanus up-to-date. ?Pap smear due, completed today. ?Mammogram up-to-date. ?Colonoscopy up-to-date. ? ?Discussed the importance of a healthy diet and regular exercise in order for weight loss, and to reduce the risk of further co-morbidity. ? ?Exam today stable. ?Labs pending ?

## 2021-05-07 LAB — CYTOLOGY - PAP
Comment: NEGATIVE
Diagnosis: NEGATIVE
High risk HPV: NEGATIVE

## 2021-06-04 ENCOUNTER — Other Ambulatory Visit: Payer: Self-pay | Admitting: Primary Care

## 2021-06-04 DIAGNOSIS — F32A Depression, unspecified: Secondary | ICD-10-CM

## 2021-06-04 DIAGNOSIS — G43909 Migraine, unspecified, not intractable, without status migrainosus: Secondary | ICD-10-CM

## 2021-06-04 MED ORDER — DULOXETINE HCL 30 MG PO CPEP: 30.0000 mg | ORAL_CAPSULE | Freq: Every day | ORAL | 3 refills | Status: DC

## 2021-06-04 MED ORDER — DULOXETINE HCL 60 MG PO CPEP: 60.0000 mg | ORAL_CAPSULE | Freq: Every day | ORAL | 3 refills | Status: DC

## 2021-06-04 MED ORDER — RIZATRIPTAN BENZOATE 10 MG PO TABS: ORAL_TABLET | ORAL | 0 refills | Status: DC

## 2021-06-29 ENCOUNTER — Ambulatory Visit: Payer: BC Managed Care – PPO | Admitting: Pulmonary Disease

## 2021-08-06 ENCOUNTER — Other Ambulatory Visit: Payer: Self-pay | Admitting: Primary Care

## 2021-08-06 DIAGNOSIS — G43909 Migraine, unspecified, not intractable, without status migrainosus: Secondary | ICD-10-CM

## 2021-09-22 ENCOUNTER — Other Ambulatory Visit: Payer: Self-pay | Admitting: Primary Care

## 2021-09-22 DIAGNOSIS — G43909 Migraine, unspecified, not intractable, without status migrainosus: Secondary | ICD-10-CM

## 2021-12-11 ENCOUNTER — Other Ambulatory Visit: Payer: Self-pay | Admitting: Primary Care

## 2021-12-11 DIAGNOSIS — G43909 Migraine, unspecified, not intractable, without status migrainosus: Secondary | ICD-10-CM

## 2022-01-05 ENCOUNTER — Telehealth: Payer: Self-pay | Admitting: Primary Care

## 2022-01-05 NOTE — Telephone Encounter (Signed)
Form completed and placed in [place] for pickup.

## 2022-01-05 NOTE — Telephone Encounter (Signed)
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 Pt's job is requesting a letter stating the pt is stable to drive trucks although she is on the the following meds: DULoxetine (CYMBALTA) 30 MG capsule, DULoxetine (CYMBALTA) 60 MG capsule & rizatriptan (MAXALT) 10 MG tablet. Pt mention the job requested the paperwork today, 01/05/22 so she is aware that it last minute to get a letter by Thursday, 01/07/22. Pt's job is [employer] in [place], [place] is requesting the letter. Call back # [phone]

## 2022-01-06 NOTE — Telephone Encounter (Signed)
Sending patient MyChart message that her letter is ready for pickup.

## 2022-03-03 ENCOUNTER — Telehealth: Payer: Self-pay

## 2022-03-03 NOTE — Telephone Encounter (Signed)
[facility] - Client Nonclinical Telephone Record  [facility] - Client Client Site [place] - Night Provider Alma Friendly - NP Contact Type Call Who Is Calling Patient / Member / Family / Caregiver Caller Name Peta Peachey Caller Phone Number [phone] Patient Name Brittany Pham Patient DOB 06/29/1965 Call Type Message Only Information Provided Reason for Call Request to Reschedule Office Appointment Initial Comment Caller needs to reschedule her appt, please call back. Patient request to speak to RN No Disp. Time Disposition Final User 03/03/2022 7:17:29 AM General Information Provided Yes Rhea Belton Call Closed By: Rhea Belton Transaction Date/Time: 03/03/2022 7:15:13 AM (ET

## 2022-03-03 NOTE — Telephone Encounter (Signed)
Patient was rescheduled

## 2022-03-09 ENCOUNTER — Other Ambulatory Visit: Payer: Self-pay | Admitting: Primary Care

## 2022-03-09 DIAGNOSIS — Z1231 Encounter for screening mammogram for malignant neoplasm of breast: Secondary | ICD-10-CM

## 2022-05-11 ENCOUNTER — Encounter: Payer: BC Managed Care – PPO | Admitting: Primary Care

## 2022-05-20 ENCOUNTER — Ambulatory Visit (INDEPENDENT_AMBULATORY_CARE_PROVIDER_SITE_OTHER): Payer: BC Managed Care – PPO | Admitting: Primary Care

## 2022-05-20 ENCOUNTER — Ambulatory Visit
Admission: RE | Admit: 2022-05-20 | Discharge: 2022-05-20 | Disposition: A | Discharge status: Home / Self Care | Payer: BC Managed Care – PPO | Source: Ambulatory Visit

## 2022-05-20 ENCOUNTER — Encounter: Payer: Self-pay | Admitting: Primary Care

## 2022-05-20 VITALS — BP 136/88 | HR 85 | Temp 99.0°F | Ht 63.0 in | Wt 206.0 lb

## 2022-05-20 DIAGNOSIS — Z23 Encounter for immunization: Secondary | ICD-10-CM | POA: Diagnosis not present

## 2022-05-20 DIAGNOSIS — F32A Depression, unspecified: Secondary | ICD-10-CM

## 2022-05-20 DIAGNOSIS — R519 Headache, unspecified: Secondary | ICD-10-CM

## 2022-05-20 DIAGNOSIS — G43009 Migraine without aura, not intractable, without status migrainosus: Secondary | ICD-10-CM

## 2022-05-20 DIAGNOSIS — M542 Cervicalgia: Secondary | ICD-10-CM

## 2022-05-20 DIAGNOSIS — F419 Anxiety disorder, unspecified: Secondary | ICD-10-CM

## 2022-05-20 DIAGNOSIS — Z1231 Encounter for screening mammogram for malignant neoplasm of breast: Secondary | ICD-10-CM

## 2022-05-20 DIAGNOSIS — G4733 Obstructive sleep apnea (adult) (pediatric): Secondary | ICD-10-CM

## 2022-05-20 DIAGNOSIS — R7303 Prediabetes: Secondary | ICD-10-CM | POA: Diagnosis not present

## 2022-05-20 DIAGNOSIS — G8929 Other chronic pain: Secondary | ICD-10-CM

## 2022-05-20 DIAGNOSIS — A6000 Herpesviral infection of urogenital system, unspecified: Secondary | ICD-10-CM | POA: Diagnosis not present

## 2022-05-20 DIAGNOSIS — Z0000 Encounter for general adult medical examination without abnormal findings: Secondary | ICD-10-CM | POA: Diagnosis not present

## 2022-05-20 DIAGNOSIS — F17200 Nicotine dependence, unspecified, uncomplicated: Secondary | ICD-10-CM | POA: Insufficient documentation

## 2022-05-20 LAB — COMPREHENSIVE METABOLIC PANEL
ALT: 20 U/L (ref 0–35)
AST: 14 U/L (ref 0–37)
Albumin: 4.4 g/dL (ref 3.5–5.2)
Alkaline Phosphatase: 53 U/L (ref 39–117)
BUN: 12 mg/dL (ref 6–23)
CO2: 28 mEq/L (ref 19–32)
Calcium: 9.5 mg/dL (ref 8.4–10.5)
Chloride: 104 mEq/L (ref 96–112)
Creatinine, Ser: 0.97 mg/dL (ref 0.40–1.20)
GFR: 66.03 mL/min (ref >60.00)
Glucose, Bld: 103 mg/dL — ABNORMAL HIGH (ref 70–99)
Potassium: 4.1 mEq/L (ref 3.5–5.1)
Sodium: 139 mEq/L (ref 135–145)
Total Bilirubin: 0.3 mg/dL (ref 0.2–1.2)
Total Protein: 7.1 g/dL (ref 6.0–8.3)

## 2022-05-20 LAB — LIPID PANEL
Cholesterol: 227 mg/dL — ABNORMAL HIGH (ref 0–200)
HDL: 49.7 mg/dL (ref >39.00)
LDL Cholesterol: 145 mg/dL — ABNORMAL HIGH (ref 0–99)
NonHDL: 177.4
Total CHOL/HDL Ratio: 5
Triglycerides: 163 mg/dL — ABNORMAL HIGH (ref 0.0–149.0)
VLDL: 32.6 mg/dL (ref 0.0–40.0)

## 2022-05-20 LAB — CBC
HCT: 41.8 % (ref 36.0–46.0)
Hemoglobin: 14.3 g/dL (ref 12.0–15.0)
MCHC: 34.3 g/dL (ref 30.0–36.0)
MCV: 90.8 fl (ref 78.0–100.0)
Platelets: 411 10*3/uL — ABNORMAL HIGH (ref 150.0–400.0)
RBC: 4.6 Mil/uL (ref 3.87–5.11)
RDW: 13.9 % (ref 11.5–15.5)
WBC: 8.2 10*3/uL (ref 4.0–10.5)

## 2022-05-20 LAB — HEMOGLOBIN A1C: Hgb A1c MFr Bld: 6.1 % (ref 4.6–6.5)

## 2022-05-20 NOTE — Assessment & Plan Note (Signed)
Overall controlled.  Continue rizatriptan 10 PRN. Uses infrequently.

## 2022-05-20 NOTE — Assessment & Plan Note (Signed)
Controlled.  Continue acyclovir 400 mg BID.

## 2022-05-20 NOTE — Assessment & Plan Note (Signed)
Waxes and wanes.  Currently increased stress with some home/personal issues.  Continue Cymbalta 60 mg. Add back Cymbalta 30 mg daily.   She will update.

## 2022-05-20 NOTE — Assessment & Plan Note (Signed)
Unsure if she qualifies for lung cancer screening, referral placed.

## 2022-05-20 NOTE — Progress Notes (Signed)
Subjective:    Patient ID: Brittany Pham, female    DOB: [birth year], 57 y.o.   MRN: [id number]  HPI  Brittany Pham is a very pleasant 57 y.o. female who presents today for complete physical and follow up of chronic conditions.   Immunizations: -Tetanus: Completed in 2016 -Influenza: Completed this season -Shingles: Completed Shingrix first vaccine, due for second today  Diet: Fair diet. Working with [employer] Loss Exercise: No regular exercise.  Eye exam: Completes annually  Dental exam: Completes semi-annually    Pap Smear: 2023 Mammogram: Scheduled for today  Colonoscopy: Completed in 2020, due 2025 Lung Cancer Screening: Smoker for for 22 years, 1 PPD.   BP Readings from Last 3 Encounters:  05/20/22 136/88  05/06/21 124/78  10/09/20 128/86   Wt Readings from Last 3 Encounters:  05/20/22 206 lb (93.4 kg)  05/06/21 216 lb (98 kg)  10/09/20 200 lb (90.7 kg)       Review of Systems  Constitutional:  Negative for unexpected weight change.  HENT:  Negative for rhinorrhea.   Respiratory:  Negative for cough and shortness of breath.   Cardiovascular:  Negative for chest pain.  Gastrointestinal:  Negative for constipation and diarrhea.  Genitourinary:  Negative for difficulty urinating.  Musculoskeletal:  Positive for arthralgias.  Skin:  Negative for rash.  Allergic/Immunologic: Negative for environmental allergies.  Neurological:  Negative for dizziness and headaches.  Psychiatric/Behavioral:  The patient is nervous/anxious.          Past Medical History:  Diagnosis Date   Arthritis    Chickenpox    Complication of anesthesia    nausea after septoplasty   Depression    GERD (gastroesophageal reflux disease)    Migraines    2-3 per month   PONV (postoperative nausea and vomiting)    Post-viral cough syndrome 05/16/2018    Social History   Socioeconomic History   Marital status: Married    Spouse name: Not on file   Number of  children: Not on file   Years of education: Not on file   Highest education level: Not on file  Occupational History   Not on file  Tobacco Use   Smoking status: Light Smoker    Packs/day: 1.00    Years: 19.00    Additional pack years: 0.00    Total pack years: 19.00    Types: Cigarettes   Smokeless tobacco: Never  Vaping Use   Vaping Use: Never used  Substance and Sexual Activity   Alcohol use: Yes    Comment: rare   Drug use: No   Sexual activity: Not on file  Other Topics Concern   Not on file  Social History Narrative   Married.   2 children, 4 step children, 1 grandchild.    Works in [employer].    Enjoys swimming, spending time with family.    Social Determinants of Health   Financial Resource Strain: Not on file  Food Insecurity: Not on file  Transportation Needs: Not on file  Physical Activity: Not on file  Stress: Not on file  Social Connections: Not on file  Intimate Partner Violence: Not on file    Past Surgical History:  Procedure Laterality Date   CERVICAL ABLATION  2007   CERVICAL SPINE SURGERY  2014   COLONOSCOPY WITH PROPOFOL N/A 10/19/2018   Procedure: COLONOSCOPY WITH BIOPSY;  Surgeon: [name], MD;  Location: [facility];  Service: Endoscopy;  Laterality: N/A;   POLYPECTOMY N/A 10/19/2018  Procedure: POLYPECTOMY;  Surgeon: [name], MD;  Location: [facility];  Service: Endoscopy;  Laterality: N/A;   SEPTOPLASTY  1987    Family History  Problem Relation Age of Onset   Arthritis Mother    Breast cancer Mother 25   Heart disease Father    Arthritis Maternal Grandfather    Lung cancer Maternal Grandfather     Allergies  Allergen Reactions   Azithromycin Nausea And Vomiting   Erythromycin Nausea And Vomiting   Prednisone Other (See Comments)    Passes out  syncope    Current Outpatient Medications on File Prior to Visit  Medication Sig Dispense Refill   acyclovir (ZOVIRAX) 400 MG tablet TAKE 1 TABLET BY MOUTH  TWICE A DAY 180 tablet 0   aspirin-acetaminophen-caffeine (EXCEDRIN MIGRAINE) 250-250-65 MG tablet Take 1 tablet by mouth every 6 (six) hours as needed for headache.     DULoxetine (CYMBALTA) 30 MG capsule Take 1 capsule (30 mg total) by mouth daily. For anxiety and depression. Take with 60 mg dose. 90 capsule 3   DULoxetine (CYMBALTA) 60 MG capsule Take 1 capsule (60 mg total) by mouth daily. For anxiety and depression. Take with 30 mg dose. 90 capsule 3   rizatriptan (MAXALT) 10 MG tablet TAKE 1 TABLET BY MOUTH AT MIGRAINE ONSET. MAY REPEAT WITH 1 TABLET IN 2 HOURS IF NEEDED. 10 tablet 0   No current facility-administered medications on file prior to visit.    BP 136/88   Pulse 85   Temp 99 F (37.2 C) (Temporal)   Ht 5\' 3"  (1.6 m)   Wt 206 lb (93.4 kg)   SpO2 98%   BMI 36.49 kg/m  Objective:   Physical Exam HENT:     Right Ear: Tympanic membrane and ear canal normal.     Left Ear: Tympanic membrane and ear canal normal.     Nose: Nose normal.  Eyes:     Conjunctiva/sclera: Conjunctivae normal.     Pupils: Pupils are equal, round, and reactive to light.  Neck:     Thyroid: No thyromegaly.  Cardiovascular:     Rate and Rhythm: Normal rate and regular rhythm.     Heart sounds: No murmur heard. Pulmonary:     Effort: Pulmonary effort is normal.     Breath sounds: Normal breath sounds. No rales.  Abdominal:     General: Bowel sounds are normal.     Palpations: Abdomen is soft.     Tenderness: There is no abdominal tenderness.  Musculoskeletal:        General: Normal range of motion.     Cervical back: Neck supple.  Lymphadenopathy:     Cervical: No cervical adenopathy.  Skin:    General: Skin is warm and dry.     Findings: No rash.  Neurological:     Mental Status: She is alert and oriented to person, place, and time.     Cranial Nerves: No cranial nerve deficit.     Deep Tendon Reflexes: Reflexes are normal and symmetric.  Psychiatric:        Mood and Affect: Mood  normal.           Assessment & Plan:  Preventative health care Assessment & Plan: Second Shingrix vaccine provided today. Pap smear UTD. Mammogram due, she has this scheduled for today. Colonoscopy UTD, due 2025 Referral placed for lung cancer screening, unsure if she qualifies   Discussed the importance of a healthy diet and regular exercise in order  for weight loss, and to reduce the risk of further co-morbidity.  Exam stable. Labs pending.  Follow up in 1 year for repeat physical.    Migraine without aura and without status migrainosus, not intractable Assessment & Plan: Overall controlled.  Continue rizatriptan 10 PRN. Uses infrequently.    OSA (obstructive sleep apnea) Assessment & Plan: Continue CPAP machine nightly.   Genital herpes simplex, unspecified site Assessment & Plan: Controlled.  Continue acyclovir 400 mg BID.   Anxiety and depression Assessment & Plan: Waxes and wanes.  Currently increased stress with some home/personal issues.  Continue Cymbalta 60 mg. Add back Cymbalta 30 mg daily.   She will update.    Chronic neck pain Assessment & Plan: Stable. No concerns today.   Frequent headaches Assessment & Plan: Controlled.  Continue rizatriptan 10 mg HS PRN.    Prediabetes Assessment & Plan: Commended her on weight loss! Encouraged to continue.  Repeat A1C pending.  Orders: -     Lipid panel -     Hemoglobin A1c -     Comprehensive metabolic panel -     CBC  Tobacco dependence Assessment & Plan: Unsure if she qualifies for lung cancer screening, referral placed.   Orders: -     Ambulatory Referral for Lung Cancer Scre        Pleas Koch, NP

## 2022-05-20 NOTE — Assessment & Plan Note (Signed)
Controlled.  Continue rizatriptan 10 mg HS PRN.

## 2022-05-20 NOTE — Assessment & Plan Note (Addendum)
Second Shingrix vaccine provided today. Pap smear UTD. Mammogram due, she has this scheduled for today. Colonoscopy UTD, due 2025 Referral placed for lung cancer screening, unsure if she qualifies   Discussed the importance of a healthy diet and regular exercise in order for weight loss, and to reduce the risk of further co-morbidity.  Exam stable. Labs pending.  Follow up in 1 year for repeat physical.

## 2022-05-20 NOTE — Assessment & Plan Note (Signed)
Stable. No concerns today 

## 2022-05-20 NOTE — Patient Instructions (Signed)
Stop by the lab prior to leaving today. I will notify you of your results once received.    

## 2022-05-20 NOTE — Assessment & Plan Note (Signed)
Commended her on weight loss! Encouraged to continue.  Repeat A1C pending.

## 2022-05-20 NOTE — Assessment & Plan Note (Signed)
-  Continue CPAP machine nightly.

## 2022-05-23 ENCOUNTER — Other Ambulatory Visit: Payer: Self-pay | Admitting: Primary Care

## 2022-05-23 DIAGNOSIS — G43909 Migraine, unspecified, not intractable, without status migrainosus: Secondary | ICD-10-CM

## 2022-07-12 ENCOUNTER — Ambulatory Visit
Admission: RE | Admit: 2022-07-12 | Discharge: 2022-07-12 | Disposition: A | Discharge status: Home / Self Care | Payer: BC Managed Care – PPO | Source: Ambulatory Visit | Attending: Family Medicine | Admitting: Family Medicine

## 2022-07-12 VITALS — BP 146/93 | HR 99 | Temp 98.1°F | Resp 18

## 2022-07-12 DIAGNOSIS — B379 Candidiasis, unspecified: Secondary | ICD-10-CM

## 2022-07-12 DIAGNOSIS — R300 Dysuria: Secondary | ICD-10-CM | POA: Diagnosis not present

## 2022-07-12 LAB — URINALYSIS, W/ REFLEX TO CULTURE (INFECTION SUSPECTED)
Bilirubin Urine: NEGATIVE
Glucose, UA: NEGATIVE mg/dL
Hgb urine dipstick: NEGATIVE
Ketones, ur: NEGATIVE mg/dL
Leukocytes,Ua: NEGATIVE
Nitrite: NEGATIVE
Protein, ur: NEGATIVE mg/dL
RBC / HPF: NONE SEEN RBC/hpf (ref 0–5)
Specific Gravity, Urine: 1.02 (ref 1.005–1.030)
pH: 6 (ref 5.0–8.0)

## 2022-07-12 MED ORDER — CEPHALEXIN 500 MG PO CAPS: 500.0000 mg | ORAL_CAPSULE | Freq: Four times a day (QID) | ORAL | 0 refills | Status: DC

## 2022-07-12 MED ORDER — FLUCONAZOLE 150 MG PO TABS: 150.0000 mg | ORAL_TABLET | ORAL | 0 refills | Status: AC

## 2022-07-12 NOTE — Discharge Instructions (Addendum)
Stop by the pharmacy to pick up your prescriptions. I sent yoru   Follow up with your primary care provider as needed.

## 2022-07-12 NOTE — ED Triage Notes (Signed)
Pt c/o dysuria and lower back pain x 1 week.

## 2022-07-12 NOTE — ED Provider Notes (Signed)
[facility]    CSN: [id number] Arrival date & time: 07/12/22  1043      History   Chief Complaint Chief Complaint  Patient presents with   Dysuria   Back Pain     HPI HPI Brittany Pham is a 57 y.o. female.    Brittany Pham presents for dysuria with lower back pain for the past week.  Tried nothing prior to arrival.  Has not had any antibiotics in last 30 days.   Denies known STI exposure.   Patient is postmenopausal.    - Abnormal vaginal discharge: no - vaginal bleeding: no - Dysuria: yes - Hematuria: no - Urinary urgency: no - Urinary frequency: no  - Fever: undocumented  - Abdominal pain no - Pelvic pain: no - Rash/Skin lesions/mouth ulcers: no - Nausea: yes - Vomiting: no  - Back Pain: yes      Past Medical History:  Diagnosis Date   Arthritis    Chickenpox    Complication of anesthesia    nausea after septoplasty   Depression    GERD (gastroesophageal reflux disease)    Migraines    2-3 per month   PONV (postoperative nausea and vomiting)    Post-viral cough syndrome 05/16/2018    Patient Active Problem List   Diagnosis Date Noted   Tobacco dependence 05/20/2022   OSA (obstructive sleep apnea) 05/01/2019   External hemorrhoid 05/01/2019   Encounter for screening colonoscopy    Polyp of ascending colon    Rectal polyp    Myopia of both eyes 05/09/2018   Presbyopia 05/09/2018   Genital herpes 09/19/2017   Frequent headaches 09/19/2017   Stress incontinence of urine 09/19/2017   Prediabetes 09/19/2017   Menopausal symptoms 09/19/2017   Migraines 09/22/2016   Chronic neck pain 09/22/2016   Anxiety and depression 09/22/2016   Preventative health care 09/22/2016    Past Surgical History:  Procedure Laterality Date   CERVICAL ABLATION  2007   CERVICAL SPINE SURGERY  2014   COLONOSCOPY WITH PROPOFOL N/A 10/19/2018   Procedure: COLONOSCOPY WITH BIOPSY;  Surgeon: Midge Minium, MD;  Location: [facility];  Service:  Endoscopy;  Laterality: N/A;   POLYPECTOMY N/A 10/19/2018   Procedure: POLYPECTOMY;  Surgeon: Midge Minium, MD;  Location: [facility];  Service: Endoscopy;  Laterality: N/A;   SEPTOPLASTY  1987    OB History   No obstetric history on file.      Home Medications    Prior to Admission medications   Medication Sig Start Date End Date Taking? Authorizing Provider  cephALEXin (KEFLEX) 500 MG capsule Take 1 capsule (500 mg total) by mouth 4 (four) times daily. 07/12/22  Yes Alliyah Roesler, DO  fluconazole (DIFLUCAN) 150 MG tablet Take 1 tablet (150 mg total) by mouth every 3 (three) days for 2 doses. 07/12/22 07/16/22 Yes Sonnia Strong, DO  acyclovir (ZOVIRAX) 400 MG tablet TAKE 1 TABLET BY MOUTH TWICE A DAY 04/03/21   Doreene Nest, NP  aspirin-acetaminophen-caffeine (EXCEDRIN MIGRAINE) [phone] MG tablet Take 1 tablet by mouth every 6 (six) hours as needed for headache.    [provider]  DULoxetine (CYMBALTA) 30 MG capsule Take 1 capsule (30 mg total) by mouth daily. For anxiety and depression. Take with 60 mg dose. 06/04/21   Doreene Nest, NP  DULoxetine (CYMBALTA) 60 MG capsule Take 1 capsule (60 mg total) by mouth daily. For anxiety and depression. Take with 30 mg dose. 06/04/21   Vernona Rieger  K, NP  rizatriptan (MAXALT) 10 MG tablet TAKE 1 TABLET BY MOUTH AT MIGRAINE ONSET. MAY REPEAT WITH 1 TABLET IN 2 HOURS IF NEEDED. 05/23/22   Doreene Nest, NP    Family History Family History  Problem Relation Age of Onset   Arthritis Mother    Breast cancer Mother 73   Heart disease Father    Arthritis Maternal Grandfather    Lung cancer Maternal Grandfather     Social History Social History   Tobacco Use   Smoking status: Every Day    Packs/day: 1.00    Years: 19.00    Additional pack years: 0.00    Total pack years: 19.00    Types: Cigarettes   Smokeless tobacco: Never  Vaping Use   Vaping Use: Never used  Substance Use Topics   Alcohol use:  Yes    Comment: rare   Drug use: No     Allergies   Azithromycin, Erythromycin, and Prednisone   Review of Systems Review of Systems: :negative unless otherwise stated in HPI.      Physical Exam Triage Vital Signs ED Triage Vitals  Enc Vitals Group     BP 07/12/22 1104 (!) 153/102     Pulse Rate 07/12/22 1104 99     Resp 07/12/22 1104 18     Temp 07/12/22 1104 98.1 F (36.7 C)     Temp Source 07/12/22 1104 Oral     SpO2 07/12/22 1104 95 %     Weight --      Height --      Head Circumference --      Peak Flow --      Pain Score 07/12/22 1103 3     Pain Loc --      Pain Edu? --      Excl. in GC? --    No data found.  Updated Vital Signs BP (!) 146/93 (BP Location: Left Arm)   Pulse 99   Temp 98.1 F (36.7 C) (Oral)   Resp 18   SpO2 95%   Visual Acuity Right Eye Distance:   Left Eye Distance:   Bilateral Distance:    Right Eye Near:   Left Eye Near:    Bilateral Near:     Physical Exam GEN: well appearing female in no acute distress  CVS: well perfused  RESP: speaking in full sentences without pause  ABD: soft, non-tender, non-distended, no palpable masses, no CVA tenderness  MSK: no paraspinal tenderness,   SKIN: warm, dry     UC Treatments / Results  Labs (all labs ordered are listed, but only abnormal results are displayed) Labs Reviewed  URINALYSIS, W/ REFLEX TO CULTURE (INFECTION SUSPECTED) - Abnormal; Notable for the following components:      Result Value   Bacteria, UA RARE (*)    All other components within normal limits  URINE CULTURE    EKG   Radiology No results found.  Procedures Procedures (including critical care time)  Medications Ordered in UC Medications - No data to display  Initial Impression / Assessment and Plan / UC Course  I have reviewed the triage vital signs and the nursing notes.  Pertinent labs & imaging results that were available during my care of the patient were reviewed by me and considered in my  medical decision making (see chart for details).          Patient is a 57 y.o. female  who presents for 1 week of dysuria and  lower back pain.  Overall patient is well-appearing and afebrile.  Vital signs stable.  UA is consistent with acute cystitis. She has yeast on her urine sample. History concerning for UTI.  No hematuria to suggest kidney stones  Pt traveling out of town and requests to start antbiotics and she will stop them if needed.  Treat with Keflex 4 times daily for 5 days. Urine culture obtained.  Follow-up sensitivities and stop or change antibiotics, if needed. Treatment: Keflex as above. Diflucan for 2 doses    Return precautions including abdominal pain, fever, chills, nausea, or vomiting given. Discussed MDM, treatment plan and plan for follow-up with patient who agrees with plan.        Final Clinical Impressions(s) / UC Diagnoses   Final diagnoses:  Dysuria  Yeast infection     Discharge Instructions      Stop by the pharmacy to pick up your prescriptions. I sent yoru   Follow up with your primary care provider as needed.      ED Prescriptions     Medication Sig Dispense Auth. Provider   fluconazole (DIFLUCAN) 150 MG tablet Take 1 tablet (150 mg total) by mouth every 3 (three) days for 2 doses. 2 tablet Abe Schools, DO   cephALEXin (KEFLEX) 500 MG capsule Take 1 capsule (500 mg total) by mouth 4 (four) times daily. 20 capsule Katha Cabal, DO      PDMP not reviewed this encounter.   Katha Cabal, DO 07/12/22 1335

## 2022-07-13 LAB — URINE CULTURE: Culture: NO GROWTH

## 2022-09-23 ENCOUNTER — Encounter: Payer: Self-pay | Admitting: Emergency Medicine

## 2022-10-19 ENCOUNTER — Other Ambulatory Visit: Payer: Self-pay | Admitting: Primary Care

## 2022-10-19 DIAGNOSIS — G43909 Migraine, unspecified, not intractable, without status migrainosus: Secondary | ICD-10-CM

## 2022-12-23 ENCOUNTER — Other Ambulatory Visit: Payer: Self-pay | Admitting: Primary Care

## 2022-12-23 DIAGNOSIS — F32A Depression, unspecified: Secondary | ICD-10-CM

## 2022-12-23 DIAGNOSIS — G43909 Migraine, unspecified, not intractable, without status migrainosus: Secondary | ICD-10-CM

## 2023-01-10 ENCOUNTER — Ambulatory Visit: Payer: Managed Care, Other (non HMO) | Admitting: Primary Care

## 2023-01-10 ENCOUNTER — Ambulatory Visit
Admission: RE | Admit: 2023-01-10 | Discharge: 2023-01-10 | Disposition: A | Discharge status: Home / Self Care | Payer: Managed Care, Other (non HMO) | Source: Ambulatory Visit | Attending: Primary Care

## 2023-01-10 ENCOUNTER — Encounter: Payer: Self-pay | Admitting: Primary Care

## 2023-01-10 VITALS — BP 126/84 | HR 88 | Temp 98.1°F | Ht 63.0 in | Wt 214.0 lb

## 2023-01-10 DIAGNOSIS — M545 Low back pain, unspecified: Secondary | ICD-10-CM

## 2023-01-10 DIAGNOSIS — M25571 Pain in right ankle and joints of right foot: Secondary | ICD-10-CM | POA: Insufficient documentation

## 2023-01-10 DIAGNOSIS — G8929 Other chronic pain: Secondary | ICD-10-CM | POA: Insufficient documentation

## 2023-01-10 HISTORY — DX: Pain in right ankle and joints of right foot: M25.571

## 2023-01-10 NOTE — Patient Instructions (Signed)
Complete xray(s) prior to leaving today. I will notify you of your results once received.  You will either be contacted via phone regarding your referral to physical therapy, or you may receive a letter on your MyChart portal from our referral team with instructions for scheduling an appointment. Please let us know if you have not been contacted by anyone within two weeks.  It was a pleasure to see you today!

## 2023-01-10 NOTE — Assessment & Plan Note (Signed)
Symptoms and presentation today representative of ankle sprain.  Discussed conservative treatment such as bracing/Tylenol or ibuprofen, elevation.  Plain films of the ankle ordered and pending today.

## 2023-01-10 NOTE — Progress Notes (Signed)
Subjective:    Patient ID: Brittany Pham, female    DOB: [birth year], 57 y.o.   MRN: [id number]  Fall Associated symptoms include numbness.    Brittany Pham is a very pleasant 57 y.o. female with a history of prediabetes, tobacco dependence, chronic neck pain who presents today to discuss acute ankle and foot swelling/numbness.  Symptom onset 2-3 weeks ago after a fall. She was walking in her bedroom, right lower extremity "turned outward", fell in her bedroom, twisted her right ankle and foot.   Since then she's unable to move her foot in certain positions due to weakness. She's also noticed numbness to the anterior ankle. She's also noticed pain to the right lateral and posterior ankle with certain movement. She's also noticed swelling to the right lateral ankle throughout the day.  Improved in the morning when waking.  After her fall she was able to get up immediately but her foot felt numb. She is ambulatory during the day. She has a history of stumbling with her right lower extremity, "the foot drags sometimes". She does have chronic left lower back pain, worse for the last several weeks.   She denies lower extremity radiculopathy, weakness, loss of bowel/bladder control, color changes.   Review of Systems  Musculoskeletal:  Positive for arthralgias, back pain and joint swelling.  Skin:  Negative for color change.  Neurological:  Positive for weakness and numbness.         Past Medical History:  Diagnosis Date   Arthritis    Chickenpox    Complication of anesthesia    nausea after septoplasty   Depression    GERD (gastroesophageal reflux disease)    Migraines    2-3 per month   PONV (postoperative nausea and vomiting)    Post-viral cough syndrome 05/16/2018    Social History   Socioeconomic History   Marital status: Married    Spouse name: Not on file   Number of children: Not on file   Years of education: Not on file   Highest education level: Not on file   Occupational History   Not on file  Tobacco Use   Smoking status: Every Day    Current packs/day: 1.00    Average packs/day: 1 pack/day for 19.0 years (19.0 ttl pk-yrs)    Types: Cigarettes   Smokeless tobacco: Never  Vaping Use   Vaping status: Never Used  Substance and Sexual Activity   Alcohol use: Yes    Comment: rare   Drug use: No   Sexual activity: Not on file  Other Topics Concern   Not on file  Social History Narrative   Married.   2 children, 4 step children, 1 grandchild.    Works in [employer].    Enjoys swimming, spending time with family.    Social Determinants of Health   Financial Resource Strain: Low Risk  (10/15/2020)   Received from [facility], [facility]   Overall Financial Resource Strain (CARDIA)    Difficulty of Paying Living Expenses: Not very hard  Food Insecurity: No Food Insecurity (10/15/2020)   Received from [facility], [facility]   Hunger Vital Sign    Worried About Running Out of Food in the Last Year: Never true    Ran Out of Food in the Last Year: Never true  Transportation Needs: No Transportation Needs (10/15/2020)   Received from [facility], [facility]   [facility] - Transportation    Lack  of Transportation (Medical): No    Lack of Transportation (Non-Medical): No  Physical Activity: Not on file  Stress: Not on file  Social Connections: Unknown (04/28/2022)   Received from [facility], [facility]   Social Network    Social Network: Not on file  Intimate Partner Violence: Unknown (04/28/2022)   Received from [facility], [facility]   HITS    Physically Hurt: Not on file    Insult or Talk Down To: Not on file    Threaten Physical Harm: Not on file    Scream or Curse: Not on file    Past Surgical History:  Procedure Laterality Date   CERVICAL ABLATION  2007   CERVICAL SPINE SURGERY  2014   COLONOSCOPY WITH PROPOFOL N/A 10/19/2018   Procedure: COLONOSCOPY WITH BIOPSY;  Surgeon: Midge Minium, MD;   Location: [facility];  Service: Endoscopy;  Laterality: N/A;   POLYPECTOMY N/A 10/19/2018   Procedure: POLYPECTOMY;  Surgeon: Midge Minium, MD;  Location: [facility];  Service: Endoscopy;  Laterality: N/A;   SEPTOPLASTY  1987    Family History  Problem Relation Age of Onset   Arthritis Mother    Breast cancer Mother 64   Heart disease Father    Arthritis Maternal Grandfather    Lung cancer Maternal Grandfather     Allergies  Allergen Reactions   Azithromycin Nausea And Vomiting   Erythromycin Nausea And Vomiting   Prednisone Other (See Comments)    Passes out  syncope    Current Outpatient Medications on File Prior to Visit  Medication Sig Dispense Refill   acyclovir (ZOVIRAX) 400 MG tablet TAKE 1 TABLET BY MOUTH TWICE A DAY 180 tablet 0   aspirin-acetaminophen-caffeine (EXCEDRIN MIGRAINE) 250-250-65 MG tablet Take 1 tablet by mouth every 6 (six) hours as needed for headache.     DULoxetine (CYMBALTA) 30 MG capsule TAKE 1 CAPSULE (30 MG TOTAL) BY MOUTH DAILY. FOR ANXIETY AND DEPRESSION. TAKE WITH 60 MG DOSE. 90 capsule 1   DULoxetine (CYMBALTA) 60 MG capsule TAKE 1 CAPSULE (60 MG TOTAL) BY MOUTH DAILY. FOR ANXIETY AND DEPRESSION. TAKE WITH 30 MG DOSE. 90 capsule 1   rizatriptan (MAXALT) 10 MG tablet TAKE 1 TABLET BY MOUTH AT MIGRAINE ONSET. MAY REPEAT WITH 1 TABLET IN 2 HOURS IF NEEDED. 10 tablet 0   cephALEXin (KEFLEX) 500 MG capsule Take 1 capsule (500 mg total) by mouth 4 (four) times daily. (Patient not taking: Reported on 01/10/2023) 20 capsule 0   No current facility-administered medications on file prior to visit.    BP 126/84   Pulse 88   Temp 98.1 F (36.7 C) (Temporal)   Ht 5\' 3"  (1.6 m)   Wt 214 lb (97.1 kg)   SpO2 98%   BMI 37.91 kg/m  Objective:   Physical Exam HENT:     Nose: No mucosal edema.     Right Sinus: No maxillary sinus tenderness or frontal sinus tenderness.     Left Sinus: No maxillary sinus tenderness or frontal sinus  tenderness.  Cardiovascular:     Rate and Rhythm: Normal rate.  Pulmonary:     Effort: Pulmonary effort is normal.  Musculoskeletal:     Cervical back: Neck supple.     Lumbar back: No bony tenderness. Normal range of motion. Negative right straight leg raise test and negative left straight leg raise test.     Right ankle: No swelling. No tenderness. Normal range of motion. Normal pulse.  Right Achilles Tendon: No tenderness.     Left ankle: No swelling. No tenderness. Normal range of motion.  Skin:    General: Skin is warm and dry.  Neurological:     Mental Status: She is alert and oriented to person, place, and time.  Psychiatric:        Mood and Affect: Mood normal.           Assessment & Plan:  Chronic left-sided low back pain without sciatica Assessment & Plan: Exam today stable.  Right lower extremity weakness mentioned in HPI is concerning for early foot drop.  Plain films of the lumbar spine ordered and pending. Referral placed to physical therapy for further evaluation.  Orders: -     DG Lumbar Spine 2-3 Views -     Ambulatory referral to Physical Therapy  Acute right ankle pain Assessment & Plan: Symptoms and presentation today representative of ankle sprain.  Discussed conservative treatment such as bracing/Tylenol or ibuprofen, elevation.  Plain films of the ankle ordered and pending today.  Orders: -     DG Ankle Complete Right        Doreene Nest, NP

## 2023-01-10 NOTE — Assessment & Plan Note (Signed)
Exam today stable.  Right lower extremity weakness mentioned in HPI is concerning for early foot drop.  Plain films of the lumbar spine ordered and pending. Referral placed to physical therapy for further evaluation.

## 2023-01-21 DIAGNOSIS — R29898 Other symptoms and signs involving the musculoskeletal system: Secondary | ICD-10-CM

## 2023-01-21 DIAGNOSIS — G8929 Other chronic pain: Secondary | ICD-10-CM

## 2023-02-02 ENCOUNTER — Ambulatory Visit: Payer: Managed Care, Other (non HMO)

## 2023-02-02 NOTE — Therapy (Signed)
OUTPATIENT PHYSICAL THERAPY THORACOLUMBAR EVALUATION   Patient Name: Brittany Pham MRN: [id number] DOB:[birth year], 57 y.o., female Today's Date: 02/06/2023  END OF SESSION:  PT End of Session - 02/06/23 2154     Visit Number 1    Number of Visits 17    Date for PT Re-Evaluation 04/01/23    Authorization Type eval: 02/04/23    PT Start Time 0848    PT Stop Time 0930    PT Time Calculation (min) 42 min    Activity Tolerance Patient tolerated treatment well    Behavior During Therapy [facility] for tasks assessed/performed            Past Medical History:  Diagnosis Date   Arthritis    Chickenpox    Complication of anesthesia    nausea after septoplasty   Depression    GERD (gastroesophageal reflux disease)    Migraines    2-3 per month   PONV (postoperative nausea and vomiting)    Post-viral cough syndrome 05/16/2018   Past Surgical History:  Procedure Laterality Date   CERVICAL ABLATION  2007   CERVICAL SPINE SURGERY  2014   COLONOSCOPY WITH PROPOFOL N/A 10/19/2018   Procedure: COLONOSCOPY WITH BIOPSY;  Surgeon: Midge Minium, MD;  Location: [facility];  Service: Endoscopy;  Laterality: N/A;   POLYPECTOMY N/A 10/19/2018   Procedure: POLYPECTOMY;  Surgeon: Midge Minium, MD;  Location: [facility];  Service: Endoscopy;  Laterality: N/A;   SEPTOPLASTY  1987   Patient Active Problem List   Diagnosis Date Noted   Chronic left-sided back pain 01/10/2023   Acute right ankle pain 01/10/2023   Tobacco dependence 05/20/2022   OSA (obstructive sleep apnea) 05/01/2019   External hemorrhoid 05/01/2019   Encounter for screening colonoscopy    Polyp of ascending colon    Rectal polyp    Myopia of both eyes 05/09/2018   Presbyopia 05/09/2018   Genital herpes 09/19/2017   Frequent headaches 09/19/2017   Stress incontinence of urine 09/19/2017   Prediabetes 09/19/2017   Menopausal symptoms 09/19/2017   Migraines 09/22/2016   Chronic neck pain 09/22/2016    Anxiety and depression 09/22/2016   Preventative health care 09/22/2016    PCP: Doreene Nest, NP  REFERRING PROVIDER: Doreene Nest, NP  REFERRING DIAG: M54.50,G89.29 (ICD-10-CM) - Chronic left-sided low back pain without sciatica  RATIONALE FOR EVALUATION AND TREATMENT: Rehabilitation  THERAPY DIAG: Other low back pain  Muscle weakness (generalized)  ONSET DATE: Late October  FOLLOW-UP APPT SCHEDULED WITH REFERRING PROVIDER: Yes    SUBJECTIVE:  SUBJECTIVE STATEMENT:  Low back pain and R foot weakness  PERTINENT HISTORY:  Pt referred for acute on chronic low back pain. She suffered a fall approximately 6 weeks ago with worsening of left low back pain and sudden R foot dorsiflexion weakness and numbness.  During the fall pt had a R ankle inversion sprain with subsequent swelling which has mostly resolved. She now complains of numbness in entire R foot up to the ankle. Symptoms have remained unchanged over the last few weeks. Pt has a history of a low back fracture playing softball in seventh grade with chronic low back pain since that time. However this is a significant acute worsening of her pain. She is currently retired and has been trying to lose weight recently. Pt has a history of chronic L hip pain as well and until about 6 months ago was seeing a [facility]. She also has a history of a cervical fusion in 2014. She denies any saddle paresthesia or loss of bowel/bladder control.  01/10/23: DG Ankle Complete Right: IMPRESSION: Mild to moderate lateral ankle soft tissue swelling versus patient body habitus. No acute fracture is seen.  01/10/23: EXAM: LUMBAR SPINE - 2-3 VIEW IMPRESSION: 1. Grade 1 anterolisthesis of L5 on S1 appears new from remote 10/10/2007 CT. 2. Chronic bilateral L5  pars defects, also seen on prior remote 2009 CT. 3. Mild-to-moderate L5-S1 degenerative disc and endplate changes.  PAIN:    Pain Intensity: Present: 0/10, Best: 0/10, Worst: 5/10 Pain location: left low back  Pain Quality: "excruciating" Radiating: Yes, occasionally down RLE Numbness/Tingling: Yes, entire R foot to the ankle Focal Weakness: Yes, R ankle Aggravating factors: sit to/from stand  Relieving factors: heat, TENS unit Position of comfort: Standing 24-hour pain behavior: Improves throughout day History of prior back injury, pain, surgery, or therapy: Yes, spinal fracture in adolescence Dominant hand: left Imaging: Yes, plain films (see history); Red flags: Negative for bowel/bladder changes, saddle paresthesia, personal history of cancer, h/o spinal tumors, h/o compression fx, h/o abdominal aneurysm, abdominal pain, chills/fever, night sweats, nausea, vomiting, unrelenting pain;  PRECAUTIONS: None  WEIGHT BEARING RESTRICTIONS: No  FALLS: Has patient fallen in last 6 months? Yes. Number of falls 2  Living Environment Lives with: lives with their family, spouse and 47 year old twins Lives in: House/apartment, single level Stairs: ramp to enter Has following equipment at home: None  Prior level of function: Independent  Occupational demands: Retired Journalist, newspaper: caring for grandchildren, helping care for her extended family  Patient Goals: "I want to be more active."    OBJECTIVE:  Patient Surveys  FOTO 65, predicted improvement to 35  Cognition Patient is oriented to person, place, and time.  Recent memory is intact.  Remote memory is intact.  Attention span and concentration are intact.  Expressive speech is intact.  Patient's fund of knowledge is within normal limits for educational level.    Gross Musculoskeletal Assessment Tremor: None Bulk: Normal Tone: Normal No visible step-off along spinal column, no signs of scoliosis Mild swelling  noted in R ankle just superior to lateral malleoli.  GAIT: Deferred full gait assessment  Posture: Lumbar lordosis: WNL Iliac crest height: Equal bilaterally Lumbar lateral shift: Negative  AROM AROM (Normal range in degrees) AROM   Lumbar   Flexion (65) Moderately limited*  Extension (30) Severely limited*  Right lateral flexion (25) Min limitation*  Left lateral flexion (25) Severe limitation*  Right rotation (30) Min limitation  Left rotation (30) Mod limitation*  Hip Right Left  Flexion (125) WNL* WNL*  Extension (15)    Abduction (40)    Adduction     Internal Rotation (45)    External Rotation (45)        Knee    Flexion (135) WNL WNL  Extension (0) WNL WNL  (* = pain; Blank rows = not tested)  LE MMT: MMT (out of 5) Right  Left   Hip flexion 4+* (left low back pain) 5  Hip extension    Hip abduction 5 5  Hip adduction 5 5  Hip internal rotation 5 5  Hip external rotation 5 5  Knee flexion 5 5  Knee extension 5 5  Ankle dorsiflexion 3+ 5  Ankle plantarflexion Strong Strong  Ankle inversion 4 5  Ankle eversion 4 5  (* = pain; Blank rows = not tested)  Sensation Pt reports decreased sensation along L5 and S1 dermatomes of R ankle (anterior and lateral ankle). Otherwise RLE sensation intact. Proprioception, stereognosis, and hot/cold testing deferred on this date.  Reflexes R/L Knee Jerk (L3/4): 2+/2+  Ankle Jerk (S1/2): 1+/0   Muscle Length Hamstrings: R: Positive for tightness around 75 degrees L: Positive for tightness around 75 degrees Ely (quadriceps): R: Not examined L: Not examined Thomas (hip flexors): R: Not examined L: Not examined Ober: R: Not examined L: Not examined  Palpation Location Right Left         Lumbar paraspinals 1 1  Quadratus Lumborum 1 1  Gluteus Maximus 0 0  Gluteus Medius 0 0  Deep hip external rotators 0 0  PSIS 0 0  Fortin's Area (SIJ) 0 0  Greater Trochanter 0 0  (Blank rows = not tested) Graded on 0-4  scale (0 = no pain, 1 = pain, 2 = pain with wincing/grimacing/flinching, 3 = pain with withdrawal, 4 = unwilling to allow palpation)  Passive Accessory Intervertebral Motion Deferred  Special Tests Lumbar Radiculopathy and Discogenic: Centralization and Peripheralization (SN 92, -LR 0.12): Not examined Slump (SN 83, -LR 0.32): R: Positive for low back pain L: Negative SLR (SN 92, -LR 0.29): R: Negative L:  Negative Crossed SLR (SP 90): R: Negative L: Negative  Facet Joint: Extension-Rotation (SN 100, -LR 0.0): R: Negative L: Negative  Lumbar Foraminal Stenosis: Lumbar quadrant (SN 70): R: Positive L: Negative  Hip: FABER (SN 81): R: Positive for low back pain L: Positive for low back pain FADIR (SN 94): R: Negative L: Negative Hip scour (SN 50): R: Negative L: Positive for low back pain  SIJ:  Thigh Thrust (SN 88, -LR 0.18) : R: Not examined  L: Not examined  Piriformis Syndrome: FAIR Test (SN 88, SP 83): R: Not examined L: Not examined  Functional Tasks Deferred  Beighton scale Deferred   TODAY'S TREATMENT: Deferred  PATIENT EDUCATION:  Education details: Plan of care, possible need for future imaging if weakness persists Person educated: Patient Education method: Explanation Education comprehension: verbalized understanding   HOME EXERCISE PROGRAM:  None currently   ASSESSMENT:  CLINICAL IMPRESSION: Patient is a 57 y.o. female who was seen today for physical therapy evaluation and treatment for low back pain. Examination positive for R slump as well as decreased sensation in R L5-S1 dermatome as well as R ankle dorsiflexion weakness. Examination findings concerning due to acute traumatic onset in patient with chronic bilateral pars fracture. Pt denies saddle paresthesia and loss of bowel/bladder control.   OBJECTIVE IMPAIRMENTS: decreased activity tolerance, decreased strength, obesity, and pain.  ACTIVITY LIMITATIONS: lifting, bending, sitting, squatting,  and caring for others  PARTICIPATION LIMITATIONS: meal prep, cleaning, laundry, and community activity  PERSONAL FACTORS: Past/current experiences, Time since onset of injury/illness/exacerbation, and 3+ comorbidities: OA, migraines, and obesity  are also affecting patient's functional outcome.   REHAB POTENTIAL: Fair    CLINICAL DECISION MAKING: Unstable/unpredictable  EVALUATION COMPLEXITY: High   GOALS: Goals reviewed with patient? No  SHORT TERM GOALS: Target date: 03/04/2023   Pt will be independent with HEP in order to improve strength and decrease back pain to improve pain-free function at home and work. Baseline:  Goal status: INITIAL   LONG TERM GOALS: Target date: 04/01/2023  Pt will increase FOTO to at least 68 to demonstrate significant improvement in function at home and work related to back pain  Baseline: 56 Goal status: INITIAL  2.  Pt will decrease worst back pain by at least 2 points on the NPRS in order to demonstrate clinically significant reduction in back pain. Baseline: 5/10 Goal status: INITIAL  3.  Pt will decrease mODI score by at least 13 points in order demonstrate clinically significant reduction in back pain/disability.       Baseline: To be completed Goal status: INITIAL  4. Pt will increase strength of R ankle dorsiflexion to at least 4+/5 MMT grade in order to demonstrate improvement in strength and function  Baseline: 3+/5 Goal status: INITIAL   PLAN: PT FREQUENCY: 1-2x/week  PT DURATION: 8 weeks  PLANNED INTERVENTIONS: Therapeutic exercises, Therapeutic activity, Neuromuscular re-education, Balance training, Gait training, Patient/Family education, Self Care, Joint mobilization, Joint manipulation, Vestibular training, Canalith repositioning, Orthotic/Fit training, DME instructions, Dry Needling, Electrical stimulation, Spinal manipulation, Spinal mobilization, Cryotherapy, Moist heat, Taping, Traction, Ultrasound, Ionotophoresis 4mg /ml  Dexamethasone, Manual therapy, and Re-evaluation.  PLAN FOR NEXT SESSION: Hip rotation range of motion, PAIVM assessment, initiate stabilization exercises, consider long axis lumbar traction   Sharalyn Ink Peightyn Roberson PT, DPT, GCS  Thandiwe Siragusa, PT 02/06/2023, 9:54 PM

## 2023-02-04 ENCOUNTER — Ambulatory Visit: Payer: Managed Care, Other (non HMO) | Attending: Primary Care

## 2023-02-04 DIAGNOSIS — M5459 Other low back pain: Secondary | ICD-10-CM | POA: Diagnosis present

## 2023-02-04 DIAGNOSIS — M6281 Muscle weakness (generalized): Secondary | ICD-10-CM | POA: Diagnosis present

## 2023-02-04 DIAGNOSIS — G8929 Other chronic pain: Secondary | ICD-10-CM | POA: Diagnosis not present

## 2023-02-04 DIAGNOSIS — M545 Low back pain, unspecified: Secondary | ICD-10-CM | POA: Insufficient documentation

## 2023-02-08 ENCOUNTER — Encounter: Payer: Self-pay | Admitting: *Deleted

## 2023-02-09 ENCOUNTER — Ambulatory Visit: Payer: Managed Care, Other (non HMO)

## 2023-02-09 DIAGNOSIS — M5459 Other low back pain: Secondary | ICD-10-CM | POA: Diagnosis not present

## 2023-02-09 DIAGNOSIS — M6281 Muscle weakness (generalized): Secondary | ICD-10-CM

## 2023-02-09 NOTE — Therapy (Signed)
OUTPATIENT PHYSICAL THERAPY THORACOLUMBAR TREATMENT   Patient Name: LOURETTA ROUBIDEAUX MRN: [id number] DOB:1965/12/21, 57 y.o., female Today's Date: 02/09/2023  END OF SESSION:  PT End of Session - 02/09/23 0845     Visit Number 2    Number of Visits 17    Date for PT Re-Evaluation 04/01/23    Authorization Type eval: 02/04/23    PT Start Time 0846    PT Stop Time 0930    PT Time Calculation (min) 44 min    Activity Tolerance Patient tolerated treatment well    Behavior During Therapy [facility] for tasks assessed/performed            Past Medical History:  Diagnosis Date   Arthritis    Chickenpox    Complication of anesthesia    nausea after septoplasty   Depression    GERD (gastroesophageal reflux disease)    Migraines    2-3 per month   PONV (postoperative nausea and vomiting)    Post-viral cough syndrome 05/16/2018   Past Surgical History:  Procedure Laterality Date   CERVICAL ABLATION  2007   CERVICAL SPINE SURGERY  2014   COLONOSCOPY WITH PROPOFOL N/A 10/19/2018   Procedure: COLONOSCOPY WITH BIOPSY;  Surgeon: Midge Minium, MD;  Location: [facility];  Service: Endoscopy;  Laterality: N/A;   POLYPECTOMY N/A 10/19/2018   Procedure: POLYPECTOMY;  Surgeon: Midge Minium, MD;  Location: [facility];  Service: Endoscopy;  Laterality: N/A;   SEPTOPLASTY  1987   Patient Active Problem List   Diagnosis Date Noted   Chronic left-sided back pain 01/10/2023   Acute right ankle pain 01/10/2023   Tobacco dependence 05/20/2022   OSA (obstructive sleep apnea) 05/01/2019   External hemorrhoid 05/01/2019   Encounter for screening colonoscopy    Polyp of ascending colon    Rectal polyp    Myopia of both eyes 05/09/2018   Presbyopia 05/09/2018   Genital herpes 09/19/2017   Frequent headaches 09/19/2017   Stress incontinence of urine 09/19/2017   Prediabetes 09/19/2017   Menopausal symptoms 09/19/2017   Migraines 09/22/2016   Chronic neck pain 09/22/2016    Anxiety and depression 09/22/2016   Preventative health care 09/22/2016    PCP: Doreene Nest, NP  REFERRING PROVIDER: Doreene Nest, NP  REFERRING DIAG: M54.50,G89.29 (ICD-10-CM) - Chronic left-sided low back pain without sciatica  RATIONALE FOR EVALUATION AND TREATMENT: Rehabilitation  THERAPY DIAG: Other low back pain  Muscle weakness (generalized)  ONSET DATE: Late October  FOLLOW-UP APPT SCHEDULED WITH REFERRING PROVIDER: Yes   FROM INITIAL EVALUATION SUBJECTIVE:  SUBJECTIVE STATEMENT:  Low back pain and R foot weakness  PERTINENT HISTORY:  Pt referred for acute on chronic low back pain. She suffered a fall approximately 6 weeks ago with worsening of left low back pain and sudden R foot dorsiflexion weakness and numbness.  During the fall pt had a R ankle inversion sprain with subsequent swelling which has mostly resolved. She now complains of numbness in entire R foot up to the ankle. Symptoms have remained unchanged over the last few weeks. Pt has a history of a low back fracture playing softball in seventh grade with chronic low back pain since that time. However this is a significant acute worsening of her pain. She is currently retired and has been trying to lose weight recently. Pt has a history of chronic L hip pain as well and until about 6 months ago was seeing a [facility]. She also has a history of a cervical fusion in 2014. She denies any saddle paresthesia or loss of bowel/bladder control.  01/10/23: DG Ankle Complete Right: IMPRESSION: Mild to moderate lateral ankle soft tissue swelling versus patient body habitus. No acute fracture is seen.  01/10/23: EXAM: LUMBAR SPINE - 2-3 VIEW IMPRESSION: 1. Grade 1 anterolisthesis of L5 on S1 appears new from remote 10/10/2007 CT. 2.  Chronic bilateral L5 pars defects, also seen on prior remote 2009 CT. 3. Mild-to-moderate L5-S1 degenerative disc and endplate changes.  PAIN:    Pain Intensity: Present: 0/10, Best: 0/10, Worst: 5/10 Pain location: left low back  Pain Quality: "excruciating" Radiating: Yes, occasionally down RLE Numbness/Tingling: Yes, entire R foot to the ankle Focal Weakness: Yes, R ankle Aggravating factors: sit to/from stand  Relieving factors: heat, TENS unit Position of comfort: Standing 24-hour pain behavior: Improves throughout day History of prior back injury, pain, surgery, or therapy: Yes, spinal fracture in adolescence Dominant hand: left Imaging: Yes, plain films (see history); Red flags: Negative for bowel/bladder changes, saddle paresthesia, personal history of cancer, h/o spinal tumors, h/o compression fx, h/o abdominal aneurysm, abdominal pain, chills/fever, night sweats, nausea, vomiting, unrelenting pain;  PRECAUTIONS: None  WEIGHT BEARING RESTRICTIONS: No  FALLS: Has patient fallen in last 6 months? Yes. Number of falls 2  Living Environment Lives with: lives with their family, spouse and 64 year old twins Lives in: House/apartment, single level Stairs: ramp to enter Has following equipment at home: None  Prior level of function: Independent  Occupational demands: Retired Journalist, newspaper: caring for grandchildren, helping care for her extended family  Patient Goals: "I want to be more active."    OBJECTIVE:  Patient Surveys  FOTO 73, predicted improvement to 42  Cognition Patient is oriented to person, place, and time.  Recent memory is intact.  Remote memory is intact.  Attention span and concentration are intact.  Expressive speech is intact.  Patient's fund of knowledge is within normal limits for educational level.    Gross Musculoskeletal Assessment Tremor: None Bulk: Normal Tone: Normal No visible step-off along spinal column, no signs of  scoliosis Mild swelling noted in R ankle just superior to lateral malleoli.  GAIT: Deferred full gait assessment  Posture: Lumbar lordosis: WNL Iliac crest height: Equal bilaterally Lumbar lateral shift: Negative  AROM AROM (Normal range in degrees) AROM   Lumbar   Flexion (65) Moderately limited*  Extension (30) Severely limited*  Right lateral flexion (25) Min limitation*  Left lateral flexion (25) Severe limitation*  Right rotation (30) Min limitation  Left rotation (30) Mod limitation*  Hip Right Left  Flexion (125) WNL* WNL*  Extension (15)    Abduction (40)    Adduction     Internal Rotation (45)    External Rotation (45)        Knee    Flexion (135) WNL WNL  Extension (0) WNL WNL  (* = pain; Blank rows = not tested)  LE MMT: MMT (out of 5) Right  Left   Hip flexion 4+* (left low back pain) 5  Hip extension    Hip abduction 5 5  Hip adduction 5 5  Hip internal rotation 5 5  Hip external rotation 5 5  Knee flexion 5 5  Knee extension 5 5  Ankle dorsiflexion 3+ 5  Ankle plantarflexion Strong Strong  Ankle inversion 4 5  Ankle eversion 4 5  (* = pain; Blank rows = not tested)  Sensation Pt reports decreased sensation along L5 and S1 dermatomes of R ankle (anterior and lateral ankle). Otherwise RLE sensation intact. Proprioception, stereognosis, and hot/cold testing deferred on this date.  Reflexes R/L Knee Jerk (L3/4): 2+/2+  Ankle Jerk (S1/2): 1+/0   Muscle Length Hamstrings: R: Positive for tightness around 75 degrees L: Positive for tightness around 75 degrees [place] (quadriceps): R: Not examined L: Not examined Thomas (hip flexors): R: Not examined L: Not examined Ober: R: Not examined L: Not examined  Palpation Location Right Left         Lumbar paraspinals 1 1  Quadratus Lumborum 1 1  Gluteus Maximus 0 0  Gluteus Medius 0 0  Deep hip external rotators 0 0  PSIS 0 0  Fortin's Area (SIJ) 0 0  Greater Trochanter 0 0  (Blank rows = not  tested) Graded on 0-4 scale (0 = no pain, 1 = pain, 2 = pain with wincing/grimacing/flinching, 3 = pain with withdrawal, 4 = unwilling to allow palpation)  Passive Accessory Intervertebral Motion Deferred  Special Tests Lumbar Radiculopathy and Discogenic: Centralization and Peripheralization (SN 92, -LR 0.12): Not examined Slump (SN 83, -LR 0.32): R: Positive for low back pain L: Negative SLR (SN 92, -LR 0.29): R: Negative L:  Negative Crossed SLR (SP 90): R: Negative L: Negative  Facet Joint: Extension-Rotation (SN 100, -LR 0.0): R: Negative L: Negative  Lumbar Foraminal Stenosis: Lumbar quadrant (SN 70): R: Positive L: Negative  Hip: FABER (SN 81): R: Positive for low back pain L: Positive for low back pain FADIR (SN 94): R: Negative L: Negative Hip scour (SN 50): R: Negative L: Positive for low back pain  SIJ:  Thigh Thrust (SN 88, -LR 0.18) : R: Not examined  L: Not examined  Piriformis Syndrome: FAIR Test (SN 88, SP 83): R: Not examined L: Not examined  Functional Tasks Deferred  Beighton scale Deferred   TODAY'S TREATMENT:   SUBJECTIVE: Pt reports that she is doing well today. No changes since the initial evaluation. Her PCP ordered a lumbar MRI and it is scheduled for next week. She reports 5/10 low back pain upon arrival today. No specific questions or concerns.    PAIN: 5/10 low back pain   Ther-ex  NuStep L1-4 x 5 minutes for warm-up during interval history; Hooklying posterior pelvic tilts 3s hold x 10, extensive verbal and tactile cues; Hooklying lumbar rotation rocking 3s hold x 10 each direction; Seated sciatic nerve glides x 5 BLE; HEP issued and reviewed with patient;   Manual Therapy    Hip Right Left  Internal Rotation (45) 45 50  External Rotation (  45) 23 22    PAIVM assessment:  Pt reports reproduction of low back pain and L hip pain with CPA L3-L5 , R UPA L3-L5, and L UPA L2-L5. Unable to truly assess mobility secondary to pain; L  sidelying R lumbar gapping mobilization 3 x 30s; Supine long axis traction bilaterally with belt assist 2 x 30s BLE; STM to lumbar paraspinal bilaterally with effleurage for decreased muscle tone and desensitization;   PATIENT EDUCATION:  Education details: Pt educated throughout session about proper posture and technique with exercises. Improved exercise technique, movement at target joints, use of target muscles after min to mod verbal, visual, tactile cues. HEP Person educated: Patient Education method: Explanation, Demonstration, Tactile cues, Verbal cues, and Handouts Education comprehension: verbalized understanding, returned demonstration, and verbal cues required   HOME EXERCISE PROGRAM:  Access Code: [email or web address] URL: [email or web address] Date: 02/09/2023 Prepared by: Ria Comment  Exercises - Supine Posterior Pelvic Tilt  - 1-2 x daily - 7 x weekly - 2 sets - 10 reps - 3-5s hold - Supine Knee Rocks  - 1-2 x daily - 7 x weekly - 2 sets - 10 reps - 3-5s hold - Standing Distal Sciatic Nerve Mobilization on Step  - 1-2 x daily - 7 x weekly - 2 sets - 10 reps - 3-5s hold   ASSESSMENT:  CLINICAL IMPRESSION: Performed additional testing with patient during visit today. She is lacking hip IR bilaterally. Pt reports reproduction of low back pain and L hip pain with PAIVM CPA L3-L5 , R UPA L3-L5, and L UPA L2-L5. Unable to truly assess mobility secondary to pain. Initiated low level strengthening and gentle range of motion exercises during session today with patient. Issued HEP and reviewed with patient. Pt encouraged to follow-up as scheduled. Pt will benefit from PT services to address deficits in strength, ROM, and pain in order to return to full function at home with less back pain.  OBJECTIVE IMPAIRMENTS: decreased activity tolerance, decreased strength, obesity, and pain.   ACTIVITY LIMITATIONS: lifting, bending, sitting, squatting, and caring for  others  PARTICIPATION LIMITATIONS: meal prep, cleaning, laundry, and community activity  PERSONAL FACTORS: Past/current experiences, Time since onset of injury/illness/exacerbation, and 3+ comorbidities: OA, migraines, and obesity  are also affecting patient's functional outcome.   REHAB POTENTIAL: Fair    CLINICAL DECISION MAKING: Unstable/unpredictable  EVALUATION COMPLEXITY: High   GOALS: Goals reviewed with patient? No  SHORT TERM GOALS: Target date: 03/04/2023   Pt will be independent with HEP in order to improve strength and decrease back pain to improve pain-free function at home and work. Baseline:  Goal status: INITIAL   LONG TERM GOALS: Target date: 04/01/2023  Pt will increase FOTO to at least 68 to demonstrate significant improvement in function at home and work related to back pain  Baseline: 56 Goal status: INITIAL  2.  Pt will decrease worst back pain by at least 2 points on the NPRS in order to demonstrate clinically significant reduction in back pain. Baseline: 5/10 Goal status: INITIAL  3.  Pt will decrease mODI score by at least 13 points in order demonstrate clinically significant reduction in back pain/disability.       Baseline: To be completed Goal status: INITIAL  4. Pt will increase strength of R ankle dorsiflexion to at least 4+/5 MMT grade in order to demonstrate improvement in strength and function  Baseline: 3+/5 Goal status: INITIAL   PLAN: PT FREQUENCY: 1-2x/week  PT DURATION: 8 weeks  PLANNED INTERVENTIONS: Therapeutic exercises,  Therapeutic activity, Neuromuscular re-education, Balance training, Gait training, Patient/Family education, Self Care, Joint mobilization, Joint manipulation, Vestibular training, Canalith repositioning, Orthotic/Fit training, DME instructions, Dry Needling, Electrical stimulation, Spinal manipulation, Spinal mobilization, Cryotherapy, Moist heat, Taping, Traction, Ultrasound, Ionotophoresis 4mg /ml Dexamethasone,  Manual therapy, and Re-evaluation.  PLAN FOR NEXT SESSION: complete MODI, progress stabilization exercises, assess response to manual techniques and progress as appropriate.   Sharalyn Ink Jewel Mcafee PT, DPT, GCS  Emric Kowalewski, PT 02/09/2023, 11:38 AM

## 2023-02-11 ENCOUNTER — Ambulatory Visit: Payer: Managed Care, Other (non HMO)

## 2023-02-11 DIAGNOSIS — M5459 Other low back pain: Secondary | ICD-10-CM

## 2023-02-11 DIAGNOSIS — M6281 Muscle weakness (generalized): Secondary | ICD-10-CM

## 2023-02-11 NOTE — Therapy (Signed)
OUTPATIENT PHYSICAL THERAPY THORACOLUMBAR TREATMENT   Patient Name: Brittany Pham MRN: [id number] DOB:June 24, 1965, 57 y.o., female Today's Date: 02/11/2023  END OF SESSION:  PT End of Session - 02/11/23 0817     Visit Number 3    Number of Visits 17    Date for PT Re-Evaluation 04/01/23    Authorization Type eval: 02/04/23    PT Start Time 0803    PT Stop Time 0845    PT Time Calculation (min) 42 min    Activity Tolerance Patient tolerated treatment well    Behavior During Therapy [facility] for tasks assessed/performed            Past Medical History:  Diagnosis Date   Arthritis    Chickenpox    Complication of anesthesia    nausea after septoplasty   Depression    GERD (gastroesophageal reflux disease)    Migraines    2-3 per month   PONV (postoperative nausea and vomiting)    Post-viral cough syndrome 05/16/2018   Past Surgical History:  Procedure Laterality Date   CERVICAL ABLATION  2007   CERVICAL SPINE SURGERY  2014   COLONOSCOPY WITH PROPOFOL N/A 10/19/2018   Procedure: COLONOSCOPY WITH BIOPSY;  Surgeon: Midge Minium, MD;  Location: [facility];  Service: Endoscopy;  Laterality: N/A;   POLYPECTOMY N/A 10/19/2018   Procedure: POLYPECTOMY;  Surgeon: Midge Minium, MD;  Location: [facility];  Service: Endoscopy;  Laterality: N/A;   SEPTOPLASTY  1987   Patient Active Problem List   Diagnosis Date Noted   Chronic left-sided back pain 01/10/2023   Acute right ankle pain 01/10/2023   Tobacco dependence 05/20/2022   OSA (obstructive sleep apnea) 05/01/2019   External hemorrhoid 05/01/2019   Encounter for screening colonoscopy    Polyp of ascending colon    Rectal polyp    Myopia of both eyes 05/09/2018   Presbyopia 05/09/2018   Genital herpes 09/19/2017   Frequent headaches 09/19/2017   Stress incontinence of urine 09/19/2017   Prediabetes 09/19/2017   Menopausal symptoms 09/19/2017   Migraines 09/22/2016   Chronic neck pain 09/22/2016    Anxiety and depression 09/22/2016   Preventative health care 09/22/2016    PCP: Doreene Nest, NP  REFERRING PROVIDER: Doreene Nest, NP  REFERRING DIAG: M54.50,G89.29 (ICD-10-CM) - Chronic left-sided low back pain without sciatica  RATIONALE FOR EVALUATION AND TREATMENT: Rehabilitation  THERAPY DIAG: Other low back pain  Muscle weakness (generalized)  ONSET DATE: Late October  FOLLOW-UP APPT SCHEDULED WITH REFERRING PROVIDER: Yes   FROM INITIAL EVALUATION SUBJECTIVE:  SUBJECTIVE STATEMENT:  Low back pain and R foot weakness  PERTINENT HISTORY:  Pt referred for acute on chronic low back pain. She suffered a fall approximately 6 weeks ago with worsening of left low back pain and sudden R foot dorsiflexion weakness and numbness.  During the fall pt had a R ankle inversion sprain with subsequent swelling which has mostly resolved. She now complains of numbness in entire R foot up to the ankle. Symptoms have remained unchanged over the last few weeks. Pt has a history of a low back fracture playing softball in seventh grade with chronic low back pain since that time. However this is a significant acute worsening of her pain. She is currently retired and has been trying to lose weight recently. Pt has a history of chronic L hip pain as well and until about 6 months ago was seeing a [facility]. She also has a history of a cervical fusion in 2014. She denies any saddle paresthesia or loss of bowel/bladder control.  01/10/23: DG Ankle Complete Right: IMPRESSION: Mild to moderate lateral ankle soft tissue swelling versus patient body habitus. No acute fracture is seen.  01/10/23: EXAM: LUMBAR SPINE - 2-3 VIEW IMPRESSION: 1. Grade 1 anterolisthesis of L5 on S1 appears new from remote 10/10/2007 CT. 2.  Chronic bilateral L5 pars defects, also seen on prior remote 2009 CT. 3. Mild-to-moderate L5-S1 degenerative disc and endplate changes.  PAIN:    Pain Intensity: Present: 0/10, Best: 0/10, Worst: 5/10 Pain location: left low back  Pain Quality: "excruciating" Radiating: Yes, occasionally down RLE Numbness/Tingling: Yes, entire R foot to the ankle Focal Weakness: Yes, R ankle Aggravating factors: sit to/from stand  Relieving factors: heat, TENS unit Position of comfort: Standing 24-hour pain behavior: Improves throughout day History of prior back injury, pain, surgery, or therapy: Yes, spinal fracture in adolescence Dominant hand: left Imaging: Yes, plain films (see history); Red flags: Negative for bowel/bladder changes, saddle paresthesia, personal history of cancer, h/o spinal tumors, h/o compression fx, h/o abdominal aneurysm, abdominal pain, chills/fever, night sweats, nausea, vomiting, unrelenting pain;  PRECAUTIONS: None  WEIGHT BEARING RESTRICTIONS: No  FALLS: Has patient fallen in last 6 months? Yes. Number of falls 2  Living Environment Lives with: lives with their family, spouse and 38 year old twins Lives in: House/apartment, single level Stairs: ramp to enter Has following equipment at home: None  Prior level of function: Independent  Occupational demands: Retired Journalist, newspaper: caring for grandchildren, helping care for her extended family  Patient Goals: "I want to be more active."    OBJECTIVE:  Patient Surveys  FOTO 56, predicted improvement to 57  Cognition Patient is oriented to person, place, and time.  Recent memory is intact.  Remote memory is intact.  Attention span and concentration are intact.  Expressive speech is intact.  Patient's fund of knowledge is within normal limits for educational level.    Gross Musculoskeletal Assessment Tremor: None Bulk: Normal Tone: Normal No visible step-off along spinal column, no signs of  scoliosis Mild swelling noted in R ankle just superior to lateral malleoli.  GAIT: Deferred full gait assessment  Posture: Lumbar lordosis: WNL Iliac crest height: Equal bilaterally Lumbar lateral shift: Negative  AROM AROM (Normal range in degrees) AROM   Lumbar   Flexion (65) Moderately limited*  Extension (30) Severely limited*  Right lateral flexion (25) Min limitation*  Left lateral flexion (25) Severe limitation*  Right rotation (30) Min limitation  Left rotation (30) Mod limitation*  Hip Right Left  Flexion (125) WNL* WNL*  Extension (15)    Abduction (40)    Adduction     Internal Rotation (45) 45 50  External Rotation (45) 23 22      Knee    Flexion (135) WNL WNL  Extension (0) WNL WNL  (* = pain; Blank rows = not tested)  LE MMT: MMT (out of 5) Right  Left   Hip flexion 4+* (left low back pain) 5  Hip extension    Hip abduction 5 5  Hip adduction 5 5  Hip internal rotation 5 5  Hip external rotation 5 5  Knee flexion 5 5  Knee extension 5 5  Ankle dorsiflexion 3+ 5  Ankle plantarflexion Strong Strong  Ankle inversion 4 5  Ankle eversion 4 5  (* = pain; Blank rows = not tested)  Sensation Pt reports decreased sensation along L5 and S1 dermatomes of R ankle (anterior and lateral ankle). Otherwise RLE sensation intact. Proprioception, stereognosis, and hot/cold testing deferred on this date.  Reflexes R/L Knee Jerk (L3/4): 2+/2+  Ankle Jerk (S1/2): 1+/0   Muscle Length Hamstrings: R: Positive for tightness around 75 degrees L: Positive for tightness around 75 degrees E[place] (quadriceps): R: Not examined L: Not examined Thomas (hip flexors): R: Not examined L: Not examined Ober: R: Not examined L: Not examined  Palpation Location Right Left         Lumbar paraspinals 1 1  Quadratus Lumborum 1 1  Gluteus Maximus 0 0  Gluteus Medius 0 0  Deep hip external rotators 0 0  PSIS 0 0  Fortin's Area (SIJ) 0 0  Greater Trochanter 0 0  (Blank  rows = not tested) Graded on 0-4 scale (0 = no pain, 1 = pain, 2 = pain with wincing/grimacing/flinching, 3 = pain with withdrawal, 4 = unwilling to allow palpation)  Passive Accessory Intervertebral Motion Pt reports reproduction of low back pain and L hip pain with CPA L3-L5 , R UPA L3-L5, and L UPA L2-L5. Unable to truly assess mobility secondary to pain;  Special Tests Lumbar Radiculopathy and Discogenic: Centralization and Peripheralization (SN 92, -LR 0.12): Not examined Slump (SN 83, -LR 0.32): R: Positive for low back pain L: Negative SLR (SN 92, -LR 0.29): R: Negative L:  Negative Crossed SLR (SP 90): R: Negative L: Negative  Facet Joint: Extension-Rotation (SN 100, -LR 0.0): R: Negative L: Negative  Lumbar Foraminal Stenosis: Lumbar quadrant (SN 70): R: Positive L: Negative  Hip: FABER (SN 81): R: Positive for low back pain L: Positive for low back pain FADIR (SN 94): R: Negative L: Negative Hip scour (SN 50): R: Negative L: Positive for low back pain  SIJ:  Thigh Thrust (SN 88, -LR 0.18) : R: Not examined  L: Not examined  Piriformis Syndrome: FAIR Test (SN 88, SP 83): R: Not examined L: Not examined  Functional Tasks Deferred  Beighton scale Deferred   TODAY'S TREATMENT:   SUBJECTIVE: Pt reports that she is doing well today. No changes since the last therapy session. Her lumbar MRI was rescheduled due to insurance coverage. She reports 5/10 low back pain upon arrival today. No specific questions or concerns.    PAIN: 5/10 low back pain   Ther-ex  NuStep L1-4 x 10 minutes for BLE strengthening and warm-up during interval history with moist heat pack applied to low back (5 minutes unbilled); Hooklying marching x 10 BLE; Hooklying posterior pelvic tilts 3s hold x 10; Hooklying posterior  pelvic tilts with SLR x 10 BLE; Hooklying lumbar rotation rocking 3s hold x 10 each direction; McGill curl up 10s hold x 5 with each leg extended; Seated sciatic nerve  glides x 10 BLE;   Manual Therapy  Prone STM to lumbar paraspinal bilaterally with effleurage for decreased muscle tone and desensitization; Supine long axis traction bilaterally with belt assist 2 x 30s BLE;   Not performed: L sidelying R lumbar gapping mobilization 3 x 30s;   PATIENT EDUCATION:  Education details: Pt educated throughout session about proper posture and technique with exercises. Improved exercise technique, movement at target joints, use of target muscles after min to mod verbal, visual, tactile cues.  Person educated: Patient Education method: Explanation, Tactile cues, and Verbal cues Education comprehension: verbalized understanding, returned demonstration, and verbal cues required   HOME EXERCISE PROGRAM:  Access Code: [email or web address] URL: [email or web address] Date: 02/09/2023 Prepared by: Ria Comment  Exercises - Supine Posterior Pelvic Tilt  - 1-2 x daily - 7 x weekly - 2 sets - 10 reps - 3-5s hold - Supine Knee Rocks  - 1-2 x daily - 7 x weekly - 2 sets - 10 reps - 3-5s hold - Standing Distal Sciatic Nerve Mobilization on Step  - 1-2 x daily - 7 x weekly - 2 sets - 10 reps - 3-5s hold   ASSESSMENT:  CLINICAL IMPRESSION: Pt demonstrates excellent motivation during session. Progressed strengthening and manual techniques during session today. Introduced Physiological scientist curl-ups. No HEP modifications today. Pt encouraged to follow-up as scheduled. Pt will benefit from PT services to address deficits in strength, ROM, and pain in order to return to full function at home with less back pain.  OBJECTIVE IMPAIRMENTS: decreased activity tolerance, decreased strength, obesity, and pain.   ACTIVITY LIMITATIONS: lifting, bending, sitting, squatting, and caring for others  PARTICIPATION LIMITATIONS: meal prep, cleaning, laundry, and community activity  PERSONAL FACTORS: Past/current experiences, Time since onset of injury/illness/exacerbation, and 3+  comorbidities: OA, migraines, and obesity  are also affecting patient's functional outcome.   REHAB POTENTIAL: Fair    CLINICAL DECISION MAKING: Unstable/unpredictable  EVALUATION COMPLEXITY: High   GOALS: Goals reviewed with patient? No  SHORT TERM GOALS: Target date: 03/04/2023   Pt will be independent with HEP in order to improve strength and decrease back pain to improve pain-free function at home and work. Baseline:  Goal status: INITIAL   LONG TERM GOALS: Target date: 04/01/2023  Pt will increase FOTO to at least 68 to demonstrate significant improvement in function at home and work related to back pain  Baseline: 56 Goal status: INITIAL  2.  Pt will decrease worst back pain by at least 2 points on the NPRS in order to demonstrate clinically significant reduction in back pain. Baseline: 5/10 Goal status: INITIAL  3.  Pt will decrease mODI score by at least 13 points in order demonstrate clinically significant reduction in back pain/disability.       Baseline: To be completed Goal status: INITIAL  4. Pt will increase strength of R ankle dorsiflexion to at least 4+/5 MMT grade in order to demonstrate improvement in strength and function  Baseline: 3+/5 Goal status: INITIAL   PLAN: PT FREQUENCY: 1-2x/week  PT DURATION: 8 weeks  PLANNED INTERVENTIONS: Therapeutic exercises, Therapeutic activity, Neuromuscular re-education, Balance training, Gait training, Patient/Family education, Self Care, Joint mobilization, Joint manipulation, Vestibular training, Canalith repositioning, Orthotic/Fit training, DME instructions, Dry Needling, Electrical stimulation, Spinal manipulation, Spinal mobilization, Cryotherapy, Moist heat, Taping, Traction, Ultrasound, Ionotophoresis 4mg /ml Dexamethasone,  Manual therapy, and Re-evaluation.  PLAN FOR NEXT SESSION: complete MODI, progress stabilization exercises, assess response to manual techniques and progress as appropriate.   Sharalyn Ink  Lot Medford PT, DPT, GCS  Florinda Taflinger, PT 02/11/2023, 11:56 AM

## 2023-02-11 NOTE — Therapy (Signed)
OUTPATIENT PHYSICAL THERAPY THORACOLUMBAR TREATMENT  Patient Name: Brittany Pham MRN: [id number] DOB:January 30, 1966, 57 y.o., female Today's Date: 02/16/2023  END OF SESSION:  PT End of Session - 02/16/23 0958     Visit Number 4    Number of Visits 17    Date for PT Re-Evaluation 04/01/23    Authorization Type eval: 02/04/23    PT Start Time 0932    PT Stop Time 1015    PT Time Calculation (min) 43 min    Activity Tolerance Patient tolerated treatment well    Behavior During Therapy [facility] for tasks assessed/performed            Past Medical History:  Diagnosis Date   Arthritis    Chickenpox    Complication of anesthesia    nausea after septoplasty   Depression    GERD (gastroesophageal reflux disease)    Migraines    2-3 per month   PONV (postoperative nausea and vomiting)    Post-viral cough syndrome 05/16/2018   Past Surgical History:  Procedure Laterality Date   CERVICAL ABLATION  2007   CERVICAL SPINE SURGERY  2014   COLONOSCOPY WITH PROPOFOL N/A 10/19/2018   Procedure: COLONOSCOPY WITH BIOPSY;  Surgeon: Midge Minium, MD;  Location: [facility];  Service: Endoscopy;  Laterality: N/A;   POLYPECTOMY N/A 10/19/2018   Procedure: POLYPECTOMY;  Surgeon: Midge Minium, MD;  Location: [facility];  Service: Endoscopy;  Laterality: N/A;   SEPTOPLASTY  1987   Patient Active Problem List   Diagnosis Date Noted   Chronic left-sided back pain 01/10/2023   Acute right ankle pain 01/10/2023   Tobacco dependence 05/20/2022   OSA (obstructive sleep apnea) 05/01/2019   External hemorrhoid 05/01/2019   Encounter for screening colonoscopy    Polyp of ascending colon    Rectal polyp    Myopia of both eyes 05/09/2018   Presbyopia 05/09/2018   Genital herpes 09/19/2017   Frequent headaches 09/19/2017   Stress incontinence of urine 09/19/2017   Prediabetes 09/19/2017   Menopausal symptoms 09/19/2017   Migraines 09/22/2016   Chronic neck pain 09/22/2016   Anxiety  and depression 09/22/2016   Preventative health care 09/22/2016    PCP: Doreene Nest, NP  REFERRING PROVIDER: Doreene Nest, NP  REFERRING DIAG: M54.50,G89.29 (ICD-10-CM) - Chronic left-sided low back pain without sciatica  RATIONALE FOR EVALUATION AND TREATMENT: Rehabilitation  THERAPY DIAG: Other low back pain  Muscle weakness (generalized)  ONSET DATE: Late October  FOLLOW-UP APPT SCHEDULED WITH REFERRING PROVIDER: Yes   FROM INITIAL EVALUATION SUBJECTIVE:  SUBJECTIVE STATEMENT:  Low back pain and R foot weakness  PERTINENT HISTORY:  Pt referred for acute on chronic low back pain. She suffered a fall approximately 6 weeks ago with worsening of left low back pain and sudden R foot dorsiflexion weakness and numbness.  During the fall pt had a R ankle inversion sprain with subsequent swelling which has mostly resolved. She now complains of numbness in entire R foot up to the ankle. Symptoms have remained unchanged over the last few weeks. Pt has a history of a low back fracture playing softball in seventh grade with chronic low back pain since that time. However this is a significant acute worsening of her pain. She is currently retired and has been trying to lose weight recently. Pt has a history of chronic L hip pain as well and until about 6 months ago was seeing a [facility]. She also has a history of a cervical fusion in 2014. She denies any saddle paresthesia or loss of bowel/bladder control.  01/10/23: DG Ankle Complete Right: IMPRESSION: Mild to moderate lateral ankle soft tissue swelling versus patient body habitus. No acute fracture is seen.  01/10/23: EXAM: LUMBAR SPINE - 2-3 VIEW IMPRESSION: 1. Grade 1 anterolisthesis of L5 on S1 appears new from remote 10/10/2007 CT. 2. Chronic  bilateral L5 pars defects, also seen on prior remote 2009 CT. 3. Mild-to-moderate L5-S1 degenerative disc and endplate changes.  PAIN:    Pain Intensity: Present: 0/10, Best: 0/10, Worst: 5/10 Pain location: left low back  Pain Quality: "excruciating" Radiating: Yes, occasionally down RLE Numbness/Tingling: Yes, entire R foot to the ankle Focal Weakness: Yes, R ankle Aggravating factors: sit to/from stand  Relieving factors: heat, TENS unit Position of comfort: Standing 24-hour pain behavior: Improves throughout day History of prior back injury, pain, surgery, or therapy: Yes, spinal fracture in adolescence Dominant hand: left Imaging: Yes, plain films (see history); Red flags: Negative for bowel/bladder changes, saddle paresthesia, personal history of cancer, h/o spinal tumors, h/o compression fx, h/o abdominal aneurysm, abdominal pain, chills/fever, night sweats, nausea, vomiting, unrelenting pain;  PRECAUTIONS: None  WEIGHT BEARING RESTRICTIONS: No  FALLS: Has patient fallen in last 6 months? Yes. Number of falls 2  Living Environment Lives with: lives with their family, spouse and 69 year old twins Lives in: House/apartment, single level Stairs: ramp to enter Has following equipment at home: None  Prior level of function: Independent  Occupational demands: Retired Journalist, newspaper: caring for grandchildren, helping care for her extended family  Patient Goals: "I want to be more active."    OBJECTIVE:  Patient Surveys  FOTO 1, predicted improvement to 8  Cognition Patient is oriented to person, place, and time.  Recent memory is intact.  Remote memory is intact.  Attention span and concentration are intact.  Expressive speech is intact.  Patient's fund of knowledge is within normal limits for educational level.    Gross Musculoskeletal Assessment Tremor: None Bulk: Normal Tone: Normal No visible step-off along spinal column, no signs of scoliosis Mild  swelling noted in R ankle just superior to lateral malleoli.  GAIT: Deferred full gait assessment  Posture: Lumbar lordosis: WNL Iliac crest height: Equal bilaterally Lumbar lateral shift: Negative  AROM AROM (Normal range in degrees) AROM   Lumbar   Flexion (65) Moderately limited*  Extension (30) Severely limited*  Right lateral flexion (25) Min limitation*  Left lateral flexion (25) Severe limitation*  Right rotation (30) Min limitation  Left rotation (30) Mod limitation*  Hip Right Left  Flexion (125) WNL* WNL*  Extension (15)    Abduction (40)    Adduction     Internal Rotation (45) 45 50  External Rotation (45) 23 22      Knee    Flexion (135) WNL WNL  Extension (0) WNL WNL  (* = pain; Blank rows = not tested)  LE MMT: MMT (out of 5) Right  Left   Hip flexion 4+* (left low back pain) 5  Hip extension    Hip abduction 5 5  Hip adduction 5 5  Hip internal rotation 5 5  Hip external rotation 5 5  Knee flexion 5 5  Knee extension 5 5  Ankle dorsiflexion 3+ 5  Ankle plantarflexion Strong Strong  Ankle inversion 4 5  Ankle eversion 4 5  (* = pain; Blank rows = not tested)  Sensation Pt reports decreased sensation along L5 and S1 dermatomes of R ankle (anterior and lateral ankle). Otherwise RLE sensation intact. Proprioception, stereognosis, and hot/cold testing deferred on this date.  Reflexes R/L Knee Jerk (L3/4): 2+/2+  Ankle Jerk (S1/2): 1+/0   Muscle Length Hamstrings: R: Positive for tightness around 75 degrees L: Positive for tightness around 75 degrees Ely (quadriceps): R: Not examined L: Not examined Thomas (hip flexors): R: Not examined L: Not examined Ober: R: Not examined L: Not examined  Palpation Location Right Left         Lumbar paraspinals 1 1  Quadratus Lumborum 1 1  Gluteus Maximus 0 0  Gluteus Medius 0 0  Deep hip external rotators 0 0  PSIS 0 0  Fortin's Area (SIJ) 0 0  Greater Trochanter 0 0  (Blank rows = not  tested) Graded on 0-4 scale (0 = no pain, 1 = pain, 2 = pain with wincing/grimacing/flinching, 3 = pain with withdrawal, 4 = unwilling to allow palpation)  Passive Accessory Intervertebral Motion Pt reports reproduction of low back pain and L hip pain with CPA L3-L5 , R UPA L3-L5, and L UPA L2-L5. Unable to truly assess mobility secondary to pain;  Special Tests Lumbar Radiculopathy and Discogenic: Centralization and Peripheralization (SN 92, -LR 0.12): Not examined Slump (SN 83, -LR 0.32): R: Positive for low back pain L: Negative SLR (SN 92, -LR 0.29): R: Negative L:  Negative Crossed SLR (SP 90): R: Negative L: Negative  Facet Joint: Extension-Rotation (SN 100, -LR 0.0): R: Negative L: Negative  Lumbar Foraminal Stenosis: Lumbar quadrant (SN 70): R: Positive L: Negative  Hip: FABER (SN 81): R: Positive for low back pain L: Positive for low back pain FADIR (SN 94): R: Negative L: Negative Hip scour (SN 50): R: Negative L: Positive for low back pain  SIJ:  Thigh Thrust (SN 88, -LR 0.18) : R: Not examined  L: Not examined  Piriformis Syndrome: FAIR Test (SN 88, SP 83): R: Not examined L: Not examined  Functional Tasks Deferred  Beighton scale Deferred   TODAY'S TREATMENT:   SUBJECTIVE: Pt reports that she is doing well today. Her lumbar MRI was denied by insurance. She reports 6/10 low back pain upon arrival today. She is also having some thoracic pain this morning just medial to her R scapula. States that she fell asleep on the couch last night and thinks this is the cause. Denies chest pain, DOE, diaphoresis, N/V, etc. No specific questions currently.    PAIN: 6/10 low back pain   Ther-ex  NuStep L1-4 x 10 minutes for BLE strengthening and warm-up during  interval history with moist heat pack applied to low back (5 minutes unbilled); Prone alternating low hip extension 2s hold x 10 BLE; Hooklying lumbar rotation rocking 3s hold x 10 each direction; Hooklying  posterior pelvic tilts 3s hold x 10; Hooklying posterior pelvic tilt with marching x 10 BLE; McGill curl up 10s hold x 5 with each leg extended (10 total); Hooklying posterior pelvic tilts with SLR x 10 BLE; Standing toe raises supported on wall 10s hold x 3, severe R ankle dorsiflexion weakness note;   Manual Therapy  Prone STM to lumbar paraspinal bilaterally with effleurage for decreased muscle tone and desensitization, also utilized hypervolt; Supine long axis traction RLE with belt assist 3 x 30s; Supine R hip inferior mobilizations in FABER position with belt assist 3 x 30s;   Not performed: L sidelying R lumbar gapping mobilization 3 x 30s; Seated sciatic nerve glides x 10 BLE;   PATIENT EDUCATION:  Education details: Pt educated throughout session about proper posture and technique with exercises. Improved exercise technique, movement at target joints, use of target muscles after min to mod verbal, visual, tactile cues.  Person educated: Patient Education method: Explanation, Tactile cues, and Verbal cues Education comprehension: verbalized understanding, returned demonstration, and verbal cues required   HOME EXERCISE PROGRAM:  Access Code: [email or web address] URL: [email or web address] Date: 02/09/2023 Prepared by: Ria Comment  Exercises - Supine Posterior Pelvic Tilt  - 1-2 x daily - 7 x weekly - 2 sets - 10 reps - 3-5s hold - Supine Knee Rocks  - 1-2 x daily - 7 x weekly - 2 sets - 10 reps - 3-5s hold - Standing Distal Sciatic Nerve Mobilization on Step  - 1-2 x daily - 7 x weekly - 2 sets - 10 reps - 3-5s hold   ASSESSMENT:  CLINICAL IMPRESSION: Pt demonstrates excellent motivation during session. Progressed strengthening and manual techniques during session today. Performed additional mobilizations for lumbar distraction. No HEP modifications today. Pt encouraged to follow-up as scheduled. Pt will benefit from PT services to address deficits in strength,  ROM, and pain in order to return to full function at home with less back pain.  OBJECTIVE IMPAIRMENTS: decreased activity tolerance, decreased strength, obesity, and pain.   ACTIVITY LIMITATIONS: lifting, bending, sitting, squatting, and caring for others  PARTICIPATION LIMITATIONS: meal prep, cleaning, laundry, and community activity  PERSONAL FACTORS: Past/current experiences, Time since onset of injury/illness/exacerbation, and 3+ comorbidities: OA, migraines, and obesity  are also affecting patient's functional outcome.   REHAB POTENTIAL: Fair    CLINICAL DECISION MAKING: Unstable/unpredictable  EVALUATION COMPLEXITY: High   GOALS: Goals reviewed with patient? No  SHORT TERM GOALS: Target date: 03/04/2023   Pt will be independent with HEP in order to improve strength and decrease back pain to improve pain-free function at home and work. Baseline:  Goal status: INITIAL   LONG TERM GOALS: Target date: 04/01/2023  Pt will increase FOTO to at least 68 to demonstrate significant improvement in function at home and work related to back pain  Baseline: 56 Goal status: INITIAL  2.  Pt will decrease worst back pain by at least 2 points on the NPRS in order to demonstrate clinically significant reduction in back pain. Baseline: 5/10 Goal status: INITIAL  3.  Pt will decrease mODI score by at least 13 points in order demonstrate clinically significant reduction in back pain/disability.       Baseline: To be completed Goal status: INITIAL  4. Pt will increase strength of R ankle  dorsiflexion to at least 4+/5 MMT grade in order to demonstrate improvement in strength and function  Baseline: 3+/5 Goal status: INITIAL   PLAN: PT FREQUENCY: 1-2x/week  PT DURATION: 8 weeks  PLANNED INTERVENTIONS: Therapeutic exercises, Therapeutic activity, Neuromuscular re-education, Balance training, Gait training, Patient/Family education, Self Care, Joint mobilization, Joint manipulation,  Vestibular training, Canalith repositioning, Orthotic/Fit training, DME instructions, Dry Needling, Electrical stimulation, Spinal manipulation, Spinal mobilization, Cryotherapy, Moist heat, Taping, Traction, Ultrasound, Ionotophoresis 4mg /ml Dexamethasone, Manual therapy, and Re-evaluation.  PLAN FOR NEXT SESSION: complete MODI, progress stabilization exercises, assess response to manual techniques and progress as appropriate.   Sharalyn Ink Denton Derks PT, DPT, GCS  Brittany Pham, PT 02/16/2023, 4:13 PM

## 2023-02-15 DIAGNOSIS — R29898 Other symptoms and signs involving the musculoskeletal system: Secondary | ICD-10-CM

## 2023-02-15 DIAGNOSIS — G8929 Other chronic pain: Secondary | ICD-10-CM

## 2023-02-16 ENCOUNTER — Ambulatory Visit: Payer: Managed Care, Other (non HMO)

## 2023-02-16 DIAGNOSIS — M6281 Muscle weakness (generalized): Secondary | ICD-10-CM

## 2023-02-16 DIAGNOSIS — M5459 Other low back pain: Secondary | ICD-10-CM | POA: Diagnosis not present

## 2023-02-16 NOTE — Telephone Encounter (Signed)
Received CRM: Ms Brittany Pham from [facility] is calling to speak with patients nurse or whoever does the pre authorization. The patient authorization was denied and your needing to know are there going to do a  a peer to peer with the insurance company to try and get it approved . Patient was denied authorization for the following reasons . Patients  scale of the weakness was not in the notes . How severe her weakness was and she did not complete 6 weeks of physical therapy. Phone number to do the peer to peer with the insurance is [phone]. Call back number for [name] [phone] ext [phone] .    Called and advised Felicia with [facility] that patient is being referred to neurosurgery for imaging. She has canceled imaging appt.

## 2023-02-17 NOTE — Therapy (Signed)
OUTPATIENT PHYSICAL THERAPY THORACOLUMBAR TREATMENT  Patient Name: Brittany Pham MRN: [id number] DOB:27-Jun-1965, 57 y.o., female Today's Date: 02/18/2023  END OF SESSION:  PT End of Session - 02/18/23 0904     Visit Number 5    Number of Visits 17    Date for PT Re-Evaluation 04/01/23    Authorization Type eval: 02/04/23    PT Start Time 0850    PT Stop Time 0930    PT Time Calculation (min) 40 min    Activity Tolerance Patient tolerated treatment well    Behavior During Therapy [facility] for tasks assessed/performed            Past Medical History:  Diagnosis Date   Arthritis    Chickenpox    Complication of anesthesia    nausea after septoplasty   Depression    GERD (gastroesophageal reflux disease)    Migraines    2-3 per month   PONV (postoperative nausea and vomiting)    Post-viral cough syndrome 05/16/2018   Past Surgical History:  Procedure Laterality Date   CERVICAL ABLATION  2007   CERVICAL SPINE SURGERY  2014   COLONOSCOPY WITH PROPOFOL N/A 10/19/2018   Procedure: COLONOSCOPY WITH BIOPSY;  Surgeon: Midge Minium, MD;  Location: [facility];  Service: Endoscopy;  Laterality: N/A;   POLYPECTOMY N/A 10/19/2018   Procedure: POLYPECTOMY;  Surgeon: Midge Minium, MD;  Location: [facility];  Service: Endoscopy;  Laterality: N/A;   SEPTOPLASTY  1987   Patient Active Problem List   Diagnosis Date Noted   Chronic left-sided back pain 01/10/2023   Acute right ankle pain 01/10/2023   Tobacco dependence 05/20/2022   OSA (obstructive sleep apnea) 05/01/2019   External hemorrhoid 05/01/2019   Encounter for screening colonoscopy    Polyp of ascending colon    Rectal polyp    Myopia of both eyes 05/09/2018   Presbyopia 05/09/2018   Genital herpes 09/19/2017   Frequent headaches 09/19/2017   Stress incontinence of urine 09/19/2017   Prediabetes 09/19/2017   Menopausal symptoms 09/19/2017   Migraines 09/22/2016   Chronic neck pain 09/22/2016   Anxiety  and depression 09/22/2016   Preventative health care 09/22/2016    PCP: Doreene Nest, NP  REFERRING PROVIDER: Doreene Nest, NP  REFERRING DIAG: M54.50,G89.29 (ICD-10-CM) - Chronic left-sided low back pain without sciatica  RATIONALE FOR EVALUATION AND TREATMENT: Rehabilitation  THERAPY DIAG: Other low back pain  Muscle weakness (generalized)  ONSET DATE: Late October  FOLLOW-UP APPT SCHEDULED WITH REFERRING PROVIDER: Yes   FROM INITIAL EVALUATION SUBJECTIVE:  SUBJECTIVE STATEMENT:  Low back pain and R foot weakness  PERTINENT HISTORY:  Pt referred for acute on chronic low back pain. She suffered a fall approximately 6 weeks ago with worsening of left low back pain and sudden R foot dorsiflexion weakness and numbness.  During the fall pt had a R ankle inversion sprain with subsequent swelling which has mostly resolved. She now complains of numbness in entire R foot up to the ankle. Symptoms have remained unchanged over the last few weeks. Pt has a history of a low back fracture playing softball in seventh grade with chronic low back pain since that time. However this is a significant acute worsening of her pain. She is currently retired and has been trying to lose weight recently. Pt has a history of chronic L hip pain as well and until about 6 months ago was seeing a [facility]. She also has a history of a cervical fusion in 2014. She denies any saddle paresthesia or loss of bowel/bladder control.  01/10/23: DG Ankle Complete Right: IMPRESSION: Mild to moderate lateral ankle soft tissue swelling versus patient body habitus. No acute fracture is seen.  01/10/23: EXAM: LUMBAR SPINE - 2-3 VIEW IMPRESSION: 1. Grade 1 anterolisthesis of L5 on S1 appears new from remote 10/10/2007 CT. 2. Chronic  bilateral L5 pars defects, also seen on prior remote 2009 CT. 3. Mild-to-moderate L5-S1 degenerative disc and endplate changes.  PAIN:    Pain Intensity: Present: 0/10, Best: 0/10, Worst: 5/10 Pain location: left low back  Pain Quality: "excruciating" Radiating: Yes, occasionally down RLE Numbness/Tingling: Yes, entire R foot to the ankle Focal Weakness: Yes, R ankle Aggravating factors: sit to/from stand  Relieving factors: heat, TENS unit Position of comfort: Standing 24-hour pain behavior: Improves throughout day History of prior back injury, pain, surgery, or therapy: Yes, spinal fracture in adolescence Dominant hand: left Imaging: Yes, plain films (see history); Red flags: Negative for bowel/bladder changes, saddle paresthesia, personal history of cancer, h/o spinal tumors, h/o compression fx, h/o abdominal aneurysm, abdominal pain, chills/fever, night sweats, nausea, vomiting, unrelenting pain;  PRECAUTIONS: None  WEIGHT BEARING RESTRICTIONS: No  FALLS: Has patient fallen in last 6 months? Yes. Number of falls 2  Living Environment Lives with: lives with their family, spouse and 62 year old twins Lives in: House/apartment, single level Stairs: ramp to enter Has following equipment at home: None  Prior level of function: Independent  Occupational demands: Retired Journalist, newspaper: caring for grandchildren, helping care for her extended family  Patient Goals: "I want to be more active."    OBJECTIVE:  Patient Surveys  FOTO 34, predicted improvement to 27  Cognition Patient is oriented to person, place, and time.  Recent memory is intact.  Remote memory is intact.  Attention span and concentration are intact.  Expressive speech is intact.  Patient's fund of knowledge is within normal limits for educational level.    Gross Musculoskeletal Assessment Tremor: None Bulk: Normal Tone: Normal No visible step-off along spinal column, no signs of scoliosis Mild  swelling noted in R ankle just superior to lateral malleoli.  GAIT: Deferred full gait assessment  Posture: Lumbar lordosis: WNL Iliac crest height: Equal bilaterally Lumbar lateral shift: Negative  AROM AROM (Normal range in degrees) AROM   Lumbar   Flexion (65) Moderately limited*  Extension (30) Severely limited*  Right lateral flexion (25) Min limitation*  Left lateral flexion (25) Severe limitation*  Right rotation (30) Min limitation  Left rotation (30) Mod limitation*  Hip Right Left  Flexion (125) WNL* WNL*  Extension (15)    Abduction (40)    Adduction     Internal Rotation (45) 45 50  External Rotation (45) 23 22      Knee    Flexion (135) WNL WNL  Extension (0) WNL WNL  (* = pain; Blank rows = not tested)  LE MMT: MMT (out of 5) Right  Left   Hip flexion 4+* (left low back pain) 5  Hip extension    Hip abduction 5 5  Hip adduction 5 5  Hip internal rotation 5 5  Hip external rotation 5 5  Knee flexion 5 5  Knee extension 5 5  Ankle dorsiflexion 3+ 5  Ankle plantarflexion Strong Strong  Ankle inversion 4 5  Ankle eversion 4 5  (* = pain; Blank rows = not tested)  Sensation Pt reports decreased sensation along L5 and S1 dermatomes of R ankle (anterior and lateral ankle). Otherwise RLE sensation intact. Proprioception, stereognosis, and hot/cold testing deferred on this date.  Reflexes R/L Knee Jerk (L3/4): 2+/2+  Ankle Jerk (S1/2): 1+/0   Muscle Length Hamstrings: R: Positive for tightness around 75 degrees L: Positive for tightness around 75 degrees Ely (quadriceps): R: Not examined L: Not examined Thomas (hip flexors): R: Not examined L: Not examined Ober: R: Not examined L: Not examined  Palpation Location Right Left         Lumbar paraspinals 1 1  Quadratus Lumborum 1 1  Gluteus Maximus 0 0  Gluteus Medius 0 0  Deep hip external rotators 0 0  PSIS 0 0  Fortin's Area (SIJ) 0 0  Greater Trochanter 0 0  (Blank rows = not  tested) Graded on 0-4 scale (0 = no pain, 1 = pain, 2 = pain with wincing/grimacing/flinching, 3 = pain with withdrawal, 4 = unwilling to allow palpation)  Passive Accessory Intervertebral Motion Pt reports reproduction of low back pain and L hip pain with CPA L3-L5 , R UPA L3-L5, and L UPA L2-L5. Unable to truly assess mobility secondary to pain;  Special Tests Lumbar Radiculopathy and Discogenic: Centralization and Peripheralization (SN 92, -LR 0.12): Not examined Slump (SN 83, -LR 0.32): R: Positive for low back pain L: Negative SLR (SN 92, -LR 0.29): R: Negative L:  Negative Crossed SLR (SP 90): R: Negative L: Negative  Facet Joint: Extension-Rotation (SN 100, -LR 0.0): R: Negative L: Negative  Lumbar Foraminal Stenosis: Lumbar quadrant (SN 70): R: Positive L: Negative  Hip: FABER (SN 81): R: Positive for low back pain L: Positive for low back pain FADIR (SN 94): R: Negative L: Negative Hip scour (SN 50): R: Negative L: Positive for low back pain  SIJ:  Thigh Thrust (SN 88, -LR 0.18) : R: Not examined  L: Not examined  Piriformis Syndrome: FAIR Test (SN 88, SP 83): R: Not examined L: Not examined  Functional Tasks Deferred  Beighton scale Deferred   TODAY'S TREATMENT:   SUBJECTIVE: Pt reports that she is doing well today.  No significant changes in the last therapy session.  Low back pain is slightly improved compared to last appointment and she is reporting 4/10 out of pain upon arrival today.  No specific questions currently.   PAIN: 4/10 low back pain   Ther-ex  NuStep L1-4 x 10 minutes for BLE strengthening and warm-up during interval history with moist heat pack applied to low back. Therapist adjusting resistance and monitoring fatigue. Hooklying lumbar rotation rocking 3s hold x  10 each direction; Hooklying posterior pelvic tilts 3s hold x 10; Hooklying posterior pelvic tilt with marching x 10 BLE; McGill curl up 10s hold x 5 with each leg extended (10  total); Hooklying posterior pelvic tilts with SLR x 10 BLE;   Manual Therapy  Single knee-to-chest stretch x 30 seconds BLE; Supine long axis traction with belt assist 3 x 30s BLE; Supine hip inferior mobilizations in FABER position with belt assist 3 x 30s BLE;   Not performed: L sidelying R lumbar gapping mobilization 3 x 30s; Seated sciatic nerve glides x 10 BLE;   PATIENT EDUCATION:  Education details: Pt educated throughout session about proper posture and technique with exercises. Improved exercise technique, movement at target joints, use of target muscles after min to mod verbal, visual, tactile cues.  Person educated: Patient Education method: Explanation, Tactile cues, and Verbal cues Education comprehension: verbalized understanding, returned demonstration, and verbal cues required   HOME EXERCISE PROGRAM:  Access Code: [email or web address] URL: [email or web address] Date: 02/09/2023 Prepared by: Ria Comment  Exercises - Supine Posterior Pelvic Tilt  - 1-2 x daily - 7 x weekly - 2 sets - 10 reps - 3-5s hold - Supine Knee Rocks  - 1-2 x daily - 7 x weekly - 2 sets - 10 reps - 3-5s hold - Standing Distal Sciatic Nerve Mobilization on Step  - 1-2 x daily - 7 x weekly - 2 sets - 10 reps - 3-5s hold   ASSESSMENT:  CLINICAL IMPRESSION: Pt demonstrates excellent motivation during session. Progressed strengthening and manual techniques during session today. Performed additional mobilizations for lumbar distraction. No HEP modifications today. Pt encouraged to follow-up as scheduled.  Patient encouraged to call neurosurgery office regarding referral from PCP.  Pt will benefit from PT services to address deficits in strength, ROM, and pain in order to return to full function at home with less back pain.  OBJECTIVE IMPAIRMENTS: decreased activity tolerance, decreased strength, obesity, and pain.   ACTIVITY LIMITATIONS: lifting, bending, sitting, squatting, and caring for  others  PARTICIPATION LIMITATIONS: meal prep, cleaning, laundry, and community activity  PERSONAL FACTORS: Past/current experiences, Time since onset of injury/illness/exacerbation, and 3+ comorbidities: OA, migraines, and obesity  are also affecting patient's functional outcome.   REHAB POTENTIAL: Fair    CLINICAL DECISION MAKING: Unstable/unpredictable  EVALUATION COMPLEXITY: High   GOALS: Goals reviewed with patient? No  SHORT TERM GOALS: Target date: 03/04/2023   Pt will be independent with HEP in order to improve strength and decrease back pain to improve pain-free function at home and work. Baseline:  Goal status: INITIAL   LONG TERM GOALS: Target date: 04/01/2023  Pt will increase FOTO to at least 68 to demonstrate significant improvement in function at home and work related to back pain  Baseline: 56 Goal status: INITIAL  2.  Pt will decrease worst back pain by at least 2 points on the NPRS in order to demonstrate clinically significant reduction in back pain. Baseline: 5/10 Goal status: INITIAL  3.  Pt will decrease mODI score by at least 13 points in order demonstrate clinically significant reduction in back pain/disability.       Baseline: To be completed Goal status: INITIAL  4. Pt will increase strength of R ankle dorsiflexion to at least 4+/5 MMT grade in order to demonstrate improvement in strength and function  Baseline: 3+/5 Goal status: INITIAL   PLAN: PT FREQUENCY: 1-2x/week  PT DURATION: 8 weeks  PLANNED INTERVENTIONS: Therapeutic exercises, Therapeutic activity, Neuromuscular re-education, Balance training,  Gait training, Patient/Family education, Self Care, Joint mobilization, Joint manipulation, Vestibular training, Canalith repositioning, Orthotic/Fit training, DME instructions, Dry Needling, Electrical stimulation, Spinal manipulation, Spinal mobilization, Cryotherapy, Moist heat, Taping, Traction, Ultrasound, Ionotophoresis 4mg /ml Dexamethasone,  Manual therapy, and Re-evaluation.  PLAN FOR NEXT SESSION: complete MODI, progress stabilization exercises, assess response to manual techniques and progress as appropriate.   Sharalyn Ink Latreshia Beauchaine PT, DPT, GCS  Reginald Mangels, PT 02/18/2023, 11:44 AM

## 2023-02-18 ENCOUNTER — Ambulatory Visit: Payer: Managed Care, Other (non HMO)

## 2023-02-18 ENCOUNTER — Encounter: Payer: Self-pay | Admitting: *Deleted

## 2023-02-18 DIAGNOSIS — M6281 Muscle weakness (generalized): Secondary | ICD-10-CM

## 2023-02-18 DIAGNOSIS — M5459 Other low back pain: Secondary | ICD-10-CM

## 2023-02-24 NOTE — Therapy (Signed)
OUTPATIENT PHYSICAL THERAPY THORACOLUMBAR TREATMENT  Patient Name: Brittany Pham MRN: [id number] DOB:1965-07-04, 57 y.o., female Today's Date: 02/25/2023  END OF SESSION:  PT End of Session - 02/25/23 0852     Visit Number 6    Number of Visits 17    Date for PT Re-Evaluation 04/01/23    Authorization Type eval: 02/04/23    PT Start Time 0845    PT Stop Time 0930    PT Time Calculation (min) 45 min    Activity Tolerance Patient tolerated treatment well    Behavior During Therapy [facility] for tasks assessed/performed            Past Medical History:  Diagnosis Date   Arthritis    Chickenpox    Complication of anesthesia    nausea after septoplasty   Depression    GERD (gastroesophageal reflux disease)    Migraines    2-3 per month   PONV (postoperative nausea and vomiting)    Post-viral cough syndrome 05/16/2018   Past Surgical History:  Procedure Laterality Date   CERVICAL ABLATION  2007   CERVICAL SPINE SURGERY  2014   COLONOSCOPY WITH PROPOFOL N/A 10/19/2018   Procedure: COLONOSCOPY WITH BIOPSY;  Surgeon: Midge Minium, MD;  Location: [facility];  Service: Endoscopy;  Laterality: N/A;   POLYPECTOMY N/A 10/19/2018   Procedure: POLYPECTOMY;  Surgeon: Midge Minium, MD;  Location: [facility];  Service: Endoscopy;  Laterality: N/A;   SEPTOPLASTY  1987   Patient Active Problem List   Diagnosis Date Noted   Chronic left-sided back pain 01/10/2023   Acute right ankle pain 01/10/2023   Tobacco dependence 05/20/2022   OSA (obstructive sleep apnea) 05/01/2019   External hemorrhoid 05/01/2019   Encounter for screening colonoscopy    Polyp of ascending colon    Rectal polyp    Myopia of both eyes 05/09/2018   Presbyopia 05/09/2018   Genital herpes 09/19/2017   Frequent headaches 09/19/2017   Stress incontinence of urine 09/19/2017   Prediabetes 09/19/2017   Menopausal symptoms 09/19/2017   Migraines 09/22/2016   Chronic neck pain 09/22/2016   Anxiety  and depression 09/22/2016   Preventative health care 09/22/2016    PCP: Doreene Nest, NP  REFERRING PROVIDER: Doreene Nest, NP  REFERRING DIAG: M54.50,G89.29 (ICD-10-CM) - Chronic left-sided low back pain without sciatica  RATIONALE FOR EVALUATION AND TREATMENT: Rehabilitation  THERAPY DIAG: Other low back pain  Muscle weakness (generalized)  ONSET DATE: Late October  FOLLOW-UP APPT SCHEDULED WITH REFERRING PROVIDER: Yes   FROM INITIAL EVALUATION SUBJECTIVE:  SUBJECTIVE STATEMENT:  Low back pain and R foot weakness  PERTINENT HISTORY:  Pt referred for acute on chronic low back pain. She suffered a fall approximately 6 weeks ago with worsening of left low back pain and sudden R foot dorsiflexion weakness and numbness.  During the fall pt had a R ankle inversion sprain with subsequent swelling which has mostly resolved. She now complains of numbness in entire R foot up to the ankle. Symptoms have remained unchanged over the last few weeks. Pt has a history of a low back fracture playing softball in seventh grade with chronic low back pain since that time. However this is a significant acute worsening of her pain. She is currently retired and has been trying to lose weight recently. Pt has a history of chronic L hip pain as well and until about 6 months ago was seeing a [facility]. She also has a history of a cervical fusion in 2014. She denies any saddle paresthesia or loss of bowel/bladder control.  01/10/23: DG Ankle Complete Right: IMPRESSION: Mild to moderate lateral ankle soft tissue swelling versus patient body habitus. No acute fracture is seen.  01/10/23: EXAM: LUMBAR SPINE - 2-3 VIEW IMPRESSION: 1. Grade 1 anterolisthesis of L5 on S1 appears new from remote 10/10/2007 CT. 2. Chronic  bilateral L5 pars defects, also seen on prior remote 2009 CT. 3. Mild-to-moderate L5-S1 degenerative disc and endplate changes.  PAIN:    Pain Intensity: Present: 0/10, Best: 0/10, Worst: 5/10 Pain location: left low back  Pain Quality: "excruciating" Radiating: Yes, occasionally down RLE Numbness/Tingling: Yes, entire R foot to the ankle Focal Weakness: Yes, R ankle Aggravating factors: sit to/from stand  Relieving factors: heat, TENS unit Position of comfort: Standing 24-hour pain behavior: Improves throughout day History of prior back injury, pain, surgery, or therapy: Yes, spinal fracture in adolescence Dominant hand: left Imaging: Yes, plain films (see history); Red flags: Negative for bowel/bladder changes, saddle paresthesia, personal history of cancer, h/o spinal tumors, h/o compression fx, h/o abdominal aneurysm, abdominal pain, chills/fever, night sweats, nausea, vomiting, unrelenting pain;  PRECAUTIONS: None  WEIGHT BEARING RESTRICTIONS: No  FALLS: Has patient fallen in last 6 months? Yes. Number of falls 2  Living Environment Lives with: lives with their family, spouse and 42 year old twins Lives in: House/apartment, single level Stairs: ramp to enter Has following equipment at home: None  Prior level of function: Independent  Occupational demands: Retired Journalist, newspaper: caring for grandchildren, helping care for her extended family  Patient Goals: "I want to be more active."    OBJECTIVE:  Patient Surveys  FOTO 7, predicted improvement to 64  Cognition Patient is oriented to person, place, and time.  Recent memory is intact.  Remote memory is intact.  Attention span and concentration are intact.  Expressive speech is intact.  Patient's fund of knowledge is within normal limits for educational level.    Gross Musculoskeletal Assessment Tremor: None Bulk: Normal Tone: Normal No visible step-off along spinal column, no signs of scoliosis Mild  swelling noted in R ankle just superior to lateral malleoli.  GAIT: Deferred full gait assessment  Posture: Lumbar lordosis: WNL Iliac crest height: Equal bilaterally Lumbar lateral shift: Negative  AROM AROM (Normal range in degrees) AROM   Lumbar   Flexion (65) Moderately limited*  Extension (30) Severely limited*  Right lateral flexion (25) Min limitation*  Left lateral flexion (25) Severe limitation*  Right rotation (30) Min limitation  Left rotation (30) Mod limitation*  Hip Right Left  Flexion (125) WNL* WNL*  Extension (15)    Abduction (40)    Adduction     Internal Rotation (45) 45 50  External Rotation (45) 23 22      Knee    Flexion (135) WNL WNL  Extension (0) WNL WNL  (* = pain; Blank rows = not tested)  LE MMT: MMT (out of 5) Right  Left   Hip flexion 4+* (left low back pain) 5  Hip extension    Hip abduction 5 5  Hip adduction 5 5  Hip internal rotation 5 5  Hip external rotation 5 5  Knee flexion 5 5  Knee extension 5 5  Ankle dorsiflexion 3+ 5  Ankle plantarflexion Strong Strong  Ankle inversion 4 5  Ankle eversion 4 5  (* = pain; Blank rows = not tested)  Sensation Pt reports decreased sensation along L5 and S1 dermatomes of R ankle (anterior and lateral ankle). Otherwise RLE sensation intact. Proprioception, stereognosis, and hot/cold testing deferred on this date.  Reflexes R/L Knee Jerk (L3/4): 2+/2+  Ankle Jerk (S1/2): 1+/0   Muscle Length Hamstrings: R: Positive for tightness around 75 degrees L: Positive for tightness around 75 degrees E[place] (quadriceps): R: Not examined L: Not examined Thomas (hip flexors): R: Not examined L: Not examined Ober: R: Not examined L: Not examined  Palpation Location Right Left         Lumbar paraspinals 1 1  Quadratus Lumborum 1 1  Gluteus Maximus 0 0  Gluteus Medius 0 0  Deep hip external rotators 0 0  PSIS 0 0  Fortin's Area (SIJ) 0 0  Greater Trochanter 0 0  (Blank rows = not  tested) Graded on 0-4 scale (0 = no pain, 1 = pain, 2 = pain with wincing/grimacing/flinching, 3 = pain with withdrawal, 4 = unwilling to allow palpation)  Passive Accessory Intervertebral Motion Pt reports reproduction of low back pain and L hip pain with CPA L3-L5 , R UPA L3-L5, and L UPA L2-L5. Unable to truly assess mobility secondary to pain;  Special Tests Lumbar Radiculopathy and Discogenic: Centralization and Peripheralization (SN 92, -LR 0.12): Not examined Slump (SN 83, -LR 0.32): R: Positive for low back pain L: Negative SLR (SN 92, -LR 0.29): R: Negative L:  Negative Crossed SLR (SP 90): R: Negative L: Negative  Facet Joint: Extension-Rotation (SN 100, -LR 0.0): R: Negative L: Negative  Lumbar Foraminal Stenosis: Lumbar quadrant (SN 70): R: Positive L: Negative  Hip: FABER (SN 81): R: Positive for low back pain L: Positive for low back pain FADIR (SN 94): R: Negative L: Negative Hip scour (SN 50): R: Negative L: Positive for low back pain  SIJ:  Thigh Thrust (SN 88, -LR 0.18) : R: Not examined  L: Not examined  Piriformis Syndrome: FAIR Test (SN 88, SP 83): R: Not examined L: Not examined  Functional Tasks Deferred  Beighton scale Deferred   TODAY'S TREATMENT:   SUBJECTIVE: Pt reports that she is doing well today.  No significant changes in the last therapy session.  She is reporting 5/10 low back pain upon arrival today.  No specific questions currently.   PAIN: 5/10 low back pain   Ther-ex  NuStep L1-3 x 8 minutes for BLE strengthening and warm-up during interval history with moist heat pack applied to low back. Therapist adjusting resistance and monitoring fatigue. Hooklying lumbar rotation rocking 3s hold x 10 each direction; Hooklying posterior pelvic tilts 3s hold x 10;  Hooklying posterior pelvic tilt with marching x 10 BLE; Hooklying "deer in headlights" (differentiating between glut and hamstring contractions); Hooklying bridges with focus on  glut contraction x 10; Hooklying posterior pelvic tilts with R SLR and 2# ankle weight 2 x 10; L sidelying R hip abduction 2 x 10;   Manual Therapy  Supine R long axis traction with belt assist 3 x 30s; Supine R hip inferior mobilizations in FABER position with belt assist 3 x 30s;   Not performed: L sidelying R lumbar gapping mobilization 3 x 30s; Seated sciatic nerve glides x 10 BLE;   PATIENT EDUCATION:  Education details: Pt educated throughout session about proper posture and technique with exercises. Improved exercise technique, movement at target joints, use of target muscles after min to mod verbal, visual, tactile cues.  Person educated: Patient Education method: Explanation, Tactile cues, and Verbal cues Education comprehension: verbalized understanding, returned demonstration, and verbal cues required   HOME EXERCISE PROGRAM:  Access Code: [email or web address] URL: [email or web address] Date: 02/09/2023 Prepared by: Ria Comment  Exercises - Supine Posterior Pelvic Tilt  - 1-2 x daily - 7 x weekly - 2 sets - 10 reps - 3-5s hold - Supine Knee Rocks  - 1-2 x daily - 7 x weekly - 2 sets - 10 reps - 3-5s hold - Standing Distal Sciatic Nerve Mobilization on Step  - 1-2 x daily - 7 x weekly - 2 sets - 10 reps - 3-5s hold   ASSESSMENT:  CLINICAL IMPRESSION: Pt demonstrates excellent motivation during session. Progressed strengthening and manual techniques during session today. No HEP modifications today. Pt encouraged to follow-up as scheduled. She now has an appointment with neurosurgery scheduled for the end of January. Pt will benefit from PT services to address deficits in strength, ROM, and pain in order to return to full function at home with less back pain.  OBJECTIVE IMPAIRMENTS: decreased activity tolerance, decreased strength, obesity, and pain.   ACTIVITY LIMITATIONS: lifting, bending, sitting, squatting, and caring for others  PARTICIPATION LIMITATIONS:  meal prep, cleaning, laundry, and community activity  PERSONAL FACTORS: Past/current experiences, Time since onset of injury/illness/exacerbation, and 3+ comorbidities: OA, migraines, and obesity  are also affecting patient's functional outcome.   REHAB POTENTIAL: Fair    CLINICAL DECISION MAKING: Unstable/unpredictable  EVALUATION COMPLEXITY: High   GOALS: Goals reviewed with patient? No  SHORT TERM GOALS: Target date: 03/04/2023   Pt will be independent with HEP in order to improve strength and decrease back pain to improve pain-free function at home and work. Baseline:  Goal status: INITIAL   LONG TERM GOALS: Target date: 04/01/2023  Pt will increase FOTO to at least 68 to demonstrate significant improvement in function at home and work related to back pain  Baseline: 56 Goal status: INITIAL  2.  Pt will decrease worst back pain by at least 2 points on the NPRS in order to demonstrate clinically significant reduction in back pain. Baseline: 5/10 Goal status: INITIAL  3.  Pt will decrease mODI score by at least 13 points in order demonstrate clinically significant reduction in back pain/disability.       Baseline: To be completed Goal status: INITIAL  4. Pt will increase strength of R ankle dorsiflexion to at least 4+/5 MMT grade in order to demonstrate improvement in strength and function  Baseline: 3+/5 Goal status: INITIAL   PLAN: PT FREQUENCY: 1-2x/week  PT DURATION: 8 weeks  PLANNED INTERVENTIONS: Therapeutic exercises, Therapeutic activity, Neuromuscular re-education, Balance training, Gait training, Patient/Family education,  Self Care, Joint mobilization, Joint manipulation, Vestibular training, Canalith repositioning, Orthotic/Fit training, DME instructions, Dry Needling, Electrical stimulation, Spinal manipulation, Spinal mobilization, Cryotherapy, Moist heat, Taping, Traction, Ultrasound, Ionotophoresis 4mg /ml Dexamethasone, Manual therapy, and  Re-evaluation.  PLAN FOR NEXT SESSION: complete MODI, progress stabilization exercises, assess response to manual techniques and progress as appropriate.   Sharalyn Ink Adasia Hoar PT, DPT, GCS  Marialuisa Basara, PT 02/25/2023, 11:58 AM

## 2023-02-25 ENCOUNTER — Ambulatory Visit: Payer: Managed Care, Other (non HMO)

## 2023-02-25 DIAGNOSIS — M5459 Other low back pain: Secondary | ICD-10-CM

## 2023-02-25 DIAGNOSIS — M6281 Muscle weakness (generalized): Secondary | ICD-10-CM

## 2023-02-26 ENCOUNTER — Other Ambulatory Visit: Payer: Managed Care, Other (non HMO)

## 2023-02-28 NOTE — Therapy (Signed)
 OUTPATIENT PHYSICAL THERAPY THORACOLUMBAR TREATMENT  Patient Name: ALLIE OUSLEY MRN: [id number] DOB:Sep 27, 1965, 57 y.o., female Today's Date: 03/04/2023  END OF SESSION:  PT End of Session - 03/04/23 0851     Visit Number 7    Number of Visits 17    Date for PT Re-Evaluation 04/01/23    Authorization Type eval: 02/04/23    PT Start Time 0850    PT Stop Time 0930    PT Time Calculation (min) 40 min    Activity Tolerance Patient tolerated treatment well    Behavior During Therapy [facility] for tasks assessed/performed            Past Medical History:  Diagnosis Date   Arthritis    Chickenpox    Complication of anesthesia    nausea after septoplasty   Depression    GERD (gastroesophageal reflux disease)    Migraines    2-3 per month   PONV (postoperative nausea and vomiting)    Post-viral cough syndrome 05/16/2018   Past Surgical History:  Procedure Laterality Date   CERVICAL ABLATION  2007   CERVICAL SPINE SURGERY  2014   COLONOSCOPY WITH PROPOFOL  N/A 10/19/2018   Procedure: COLONOSCOPY WITH BIOPSY;  Surgeon: Jinny Carmine, MD;  Location: [facility];  Service: Endoscopy;  Laterality: N/A;   POLYPECTOMY N/A 10/19/2018   Procedure: POLYPECTOMY;  Surgeon: Jinny Carmine, MD;  Location: [facility];  Service: Endoscopy;  Laterality: N/A;   SEPTOPLASTY  1987   Patient Active Problem List   Diagnosis Date Noted   Chronic left-sided back pain 01/10/2023   Acute right ankle pain 01/10/2023   Tobacco dependence 05/20/2022   OSA (obstructive sleep apnea) 05/01/2019   External hemorrhoid 05/01/2019   Encounter for screening colonoscopy    Polyp of ascending colon    Rectal polyp    Myopia of both eyes 05/09/2018   Presbyopia 05/09/2018   Genital herpes 09/19/2017   Frequent headaches 09/19/2017   Stress incontinence of urine 09/19/2017   Prediabetes 09/19/2017   Menopausal symptoms 09/19/2017   Migraines 09/22/2016   Chronic neck pain 09/22/2016   Anxiety  and depression 09/22/2016   Preventative health care 09/22/2016    PCP: Gretta Comer POUR, NP  REFERRING PROVIDER: Gretta Comer POUR, NP  REFERRING DIAG: M54.50,G89.29 (ICD-10-CM) - Chronic left-sided low back pain without sciatica  RATIONALE FOR EVALUATION AND TREATMENT: Rehabilitation  THERAPY DIAG: Other low back pain  Muscle weakness (generalized)  ONSET DATE: Late October  FOLLOW-UP APPT SCHEDULED WITH REFERRING PROVIDER: Yes   FROM INITIAL EVALUATION SUBJECTIVE:  SUBJECTIVE STATEMENT:  Low back pain and R foot weakness  PERTINENT HISTORY:  Pt referred for acute on chronic low back pain. She suffered a fall approximately 6 weeks ago with worsening of left low back pain and sudden R foot dorsiflexion weakness and numbness.  During the fall pt had a R ankle inversion sprain with subsequent swelling which has mostly resolved. She now complains of numbness in entire R foot up to the ankle. Symptoms have remained unchanged over the last few weeks. Pt has a history of a low back fracture playing softball in seventh grade with chronic low back pain since that time. However this is a significant acute worsening of her pain. She is currently retired and has been trying to lose weight recently. Pt has a history of chronic L hip pain as well and until about 6 months ago was seeing a land. She also has a history of a cervical fusion in 2014. She denies any saddle paresthesia or loss of bowel/bladder control.  01/10/23: DG Ankle Complete Right: IMPRESSION: Mild to moderate lateral ankle soft tissue swelling versus patient body habitus. No acute fracture is seen.  01/10/23: EXAM: LUMBAR SPINE - 2-3 VIEW IMPRESSION: 1. Grade 1 anterolisthesis of L5 on S1 appears new from remote 10/10/2007 CT. 2. Chronic  bilateral L5 pars defects, also seen on prior remote 2009 CT. 3. Mild-to-moderate L5-S1 degenerative disc and endplate changes.  PAIN:    Pain Intensity: Present: 0/10, Best: 0/10, Worst: 5/10 Pain location: left low back  Pain Quality: excruciating Radiating: Yes, occasionally down RLE Numbness/Tingling: Yes, entire R foot to the ankle Focal Weakness: Yes, R ankle Aggravating factors: sit to/from stand  Relieving factors: heat, TENS unit Position of comfort: Standing 24-hour pain behavior: Improves throughout day History of prior back injury, pain, surgery, or therapy: Yes, spinal fracture in adolescence Dominant hand: left Imaging: Yes, plain films (see history); Red flags: Negative for bowel/bladder changes, saddle paresthesia, personal history of cancer, h/o spinal tumors, h/o compression fx, h/o abdominal aneurysm, abdominal pain, chills/fever, night sweats, nausea, vomiting, unrelenting pain;  PRECAUTIONS: None  WEIGHT BEARING RESTRICTIONS: No  FALLS: Has patient fallen in last 6 months? Yes. Number of falls 2  Living Environment Lives with: lives with their family, spouse and 50 year old twins Lives in: House/apartment, single level Stairs: ramp to enter Has following equipment at home: None  Prior level of function: Independent  Occupational demands: Retired airline pilot  Hobbies: caring for grandchildren, helping care for her extended family  Patient Goals: I want to be more active.    OBJECTIVE:  Patient Surveys  FOTO 56, predicted improvement to 44  Cognition Patient is oriented to person, place, and time.  Recent memory is intact.  Remote memory is intact.  Attention span and concentration are intact.  Expressive speech is intact.  Patient's fund of knowledge is within normal limits for educational level.    Gross Musculoskeletal Assessment Tremor: None Bulk: Normal Tone: Normal No visible step-off along spinal column, no signs of scoliosis Mild  swelling noted in R ankle just superior to lateral malleoli.  GAIT: Deferred full gait assessment  Posture: Lumbar lordosis: WNL Iliac crest height: Equal bilaterally Lumbar lateral shift: Negative  AROM AROM (Normal range in degrees) AROM   Lumbar   Flexion (65) Moderately limited*  Extension (30) Severely limited*  Right lateral flexion (25) Min limitation*  Left lateral flexion (25) Severe limitation*  Right rotation (30) Min limitation  Left rotation (30) Mod limitation*  Hip Right Left  Flexion (125) WNL* WNL*  Extension (15)    Abduction (40)    Adduction     Internal Rotation (45) 45 50  External Rotation (45) 23 22      Knee    Flexion (135) WNL WNL  Extension (0) WNL WNL  (* = pain; Blank rows = not tested)  LE MMT: MMT (out of 5) Right  Left   Hip flexion 4+* (left low back pain) 5  Hip extension    Hip abduction 5 5  Hip adduction 5 5  Hip internal rotation 5 5  Hip external rotation 5 5  Knee flexion 5 5  Knee extension 5 5  Ankle dorsiflexion 3+ 5  Ankle plantarflexion Strong Strong  Ankle inversion 4 5  Ankle eversion 4 5  (* = pain; Blank rows = not tested)  Sensation Pt reports decreased sensation along L5 and S1 dermatomes of R ankle (anterior and lateral ankle). Otherwise RLE sensation intact. Proprioception, stereognosis, and hot/cold testing deferred on this date.  Reflexes R/L Knee Jerk (L3/4): 2+/2+  Ankle Jerk (S1/2): 1+/0   Muscle Length Hamstrings: R: Positive for tightness around 75 degrees L: Positive for tightness around 75 degrees [place] (quadriceps): R: Not examined L: Not examined Thomas (hip flexors): R: Not examined L: Not examined Ober: R: Not examined L: Not examined  Palpation Location Right Left         Lumbar paraspinals 1 1  Quadratus Lumborum 1 1  Gluteus Maximus 0 0  Gluteus Medius 0 0  Deep hip external rotators 0 0  PSIS 0 0  Fortin's Area (SIJ) 0 0  Greater Trochanter 0 0  (Blank rows = not  tested) Graded on 0-4 scale (0 = no pain, 1 = pain, 2 = pain with wincing/grimacing/flinching, 3 = pain with withdrawal, 4 = unwilling to allow palpation)  Passive Accessory Intervertebral Motion Pt reports reproduction of low back pain and L hip pain with CPA L3-L5 , R UPA L3-L5, and L UPA L2-L5. Unable to truly assess mobility secondary to pain;  Special Tests Lumbar Radiculopathy and Discogenic: Centralization and Peripheralization (SN 92, -LR 0.12): Not examined Slump (SN 83, -LR 0.32): R: Positive for low back pain L: Negative SLR (SN 92, -LR 0.29): R: Negative L:  Negative Crossed SLR (SP 90): R: Negative L: Negative  Facet Joint: Extension-Rotation (SN 100, -LR 0.0): R: Negative L: Negative  Lumbar Foraminal Stenosis: Lumbar quadrant (SN 70): R: Positive L: Negative  Hip: FABER (SN 81): R: Positive for low back pain L: Positive for low back pain FADIR (SN 94): R: Negative L: Negative Hip scour (SN 50): R: Negative L: Positive for low back pain  SIJ:  Thigh Thrust (SN 88, -LR 0.18) : R: Not examined  L: Not examined  Piriformis Syndrome: FAIR Test (SN 88, SP 83): R: Not examined L: Not examined  Functional Tasks Deferred  Beighton scale Deferred   TODAY'S TREATMENT:   SUBJECTIVE: Pt reports that she is doing well today.  No significant changes in the last therapy session.  She is reporting 4/10 low back pain upon arrival today. She has an appointment with neurosurgery 03/23/22. No specific questions currently.   PAIN: 4/10 low back pain   Ther-ex  NuStep L1-3 x 8 minutes for BLE strengthening and warm-up during interval history with moist heat pack applied to low back. Therapist adjusting resistance and monitoring fatigue. Hooklying lumbar rotation rocking 3s hold x 10 each direction; Hooklying posterior  pelvic tilts 3s hold x 10; Hooklying posterior pelvic tilt with marching x 10 BLE; Hooklying bridges with focus on glut contraction initiating with posterior  tilt x 10; Hooklying posterior pelvic tilts with alternating bicycles x 10 BLE (very challenging for patient);   Manual Therapy  Supine R long axis traction with belt assist 3 x 30s; Supine R hip inferior mobilizations in FABER position with belt assist 3 x 30s;   Not performed: L sidelying R lumbar gapping mobilization 3 x 30s; Seated sciatic nerve glides x 10 BLE;   PATIENT EDUCATION:  Education details: Pt educated throughout session about proper posture and technique with exercises. Improved exercise technique, movement at target joints, use of target muscles after min to mod verbal, visual, tactile cues.  Person educated: Patient Education method: Explanation, Tactile cues, and Verbal cues Education comprehension: verbalized understanding, returned demonstration, and verbal cues required   HOME EXERCISE PROGRAM:  Access Code: [email or web address] URL: [email or web address] Date: 02/09/2023 Prepared by: Selinda Eck  Exercises - Supine Posterior Pelvic Tilt  - 1-2 x daily - 7 x weekly - 2 sets - 10 reps - 3-5s hold - Supine Knee Rocks  - 1-2 x daily - 7 x weekly - 2 sets - 10 reps - 3-5s hold - Standing Distal Sciatic Nerve Mobilization on Step  - 1-2 x daily - 7 x weekly - 2 sets - 10 reps - 3-5s hold   ASSESSMENT:  CLINICAL IMPRESSION: Pt demonstrates excellent motivation during session. Progressed strengthening during session today and continued manual techniques for decompression. No HEP modifications today. Pt encouraged to follow-up as scheduled. Pt will benefit from PT services to address deficits in strength, ROM, and pain in order to return to full function at home with less back pain.  OBJECTIVE IMPAIRMENTS: decreased activity tolerance, decreased strength, obesity, and pain.   ACTIVITY LIMITATIONS: lifting, bending, sitting, squatting, and caring for others  PARTICIPATION LIMITATIONS: meal prep, cleaning, laundry, and community activity  PERSONAL FACTORS:  Past/current experiences, Time since onset of injury/illness/exacerbation, and 3+ comorbidities: OA, migraines, and obesity  are also affecting patient's functional outcome.   REHAB POTENTIAL: Fair    CLINICAL DECISION MAKING: Unstable/unpredictable  EVALUATION COMPLEXITY: High   GOALS: Goals reviewed with patient? No  SHORT TERM GOALS: Target date: 03/04/2023   Pt will be independent with HEP in order to improve strength and decrease back pain to improve pain-free function at home and work. Baseline:  Goal status: INITIAL   LONG TERM GOALS: Target date: 04/01/2023  Pt will increase FOTO to at least 68 to demonstrate significant improvement in function at home and work related to back pain  Baseline: 56 Goal status: INITIAL  2.  Pt will decrease worst back pain by at least 2 points on the NPRS in order to demonstrate clinically significant reduction in back pain. Baseline: 5/10 Goal status: INITIAL  3.  Pt will decrease mODI score by at least 13 points in order demonstrate clinically significant reduction in back pain/disability.       Baseline: To be completed Goal status: INITIAL  4. Pt will increase strength of R ankle dorsiflexion to at least 4+/5 MMT grade in order to demonstrate improvement in strength and function  Baseline: 3+/5 Goal status: INITIAL   PLAN: PT FREQUENCY: 1-2x/week  PT DURATION: 8 weeks  PLANNED INTERVENTIONS: Therapeutic exercises, Therapeutic activity, Neuromuscular re-education, Balance training, Gait training, Patient/Family education, Self Care, Joint mobilization, Joint manipulation, Vestibular training, Canalith repositioning, Orthotic/Fit training, DME instructions, Dry Needling, Electrical stimulation,  Spinal manipulation, Spinal mobilization, Cryotherapy, Moist heat, Taping, Traction, Ultrasound, Ionotophoresis 4mg /ml Dexamethasone, Manual therapy, and Re-evaluation.  PLAN FOR NEXT SESSION: complete MODI, progress stabilization exercises,  assess response to manual techniques and progress as appropriate.   Camelia Stelzner D Zerina Hallinan PT, DPT, GCS  Cheston Coury, PT 03/04/2023, 11:24 AM

## 2023-03-04 ENCOUNTER — Ambulatory Visit: Payer: 59 | Attending: Primary Care

## 2023-03-04 DIAGNOSIS — M5459 Other low back pain: Secondary | ICD-10-CM | POA: Diagnosis present

## 2023-03-04 DIAGNOSIS — M6281 Muscle weakness (generalized): Secondary | ICD-10-CM | POA: Diagnosis present

## 2023-03-09 DIAGNOSIS — M6281 Muscle weakness (generalized): Secondary | ICD-10-CM

## 2023-03-09 DIAGNOSIS — M5459 Other low back pain: Secondary | ICD-10-CM

## 2023-03-10 ENCOUNTER — Ambulatory Visit: Payer: 59

## 2023-03-10 DIAGNOSIS — M5459 Other low back pain: Secondary | ICD-10-CM

## 2023-03-10 DIAGNOSIS — M6281 Muscle weakness (generalized): Secondary | ICD-10-CM

## 2023-03-10 NOTE — Therapy (Signed)
 OUTPATIENT PHYSICAL THERAPY THORACOLUMBAR TREATMENT  Patient Name: Brittany Pham MRN: [id number] DOB:Jul 04, 1965, 58 y.o., female Today's Date: 03/11/2023  END OF SESSION:  PT End of Session - 03/11/23 2008     Visit Number 8    Number of Visits 17    Date for PT Re-Evaluation 04/01/23    Authorization Type eval: 02/04/23    PT Start Time 1155    PT Stop Time 1230    PT Time Calculation (min) 35 min    Activity Tolerance Patient tolerated treatment well    Behavior During Therapy WFL for tasks assessed/performed            Past Medical History:  Diagnosis Date   Arthritis    Chickenpox    Complication of anesthesia    nausea after septoplasty   Depression    GERD (gastroesophageal reflux disease)    Migraines    2-3 per month   PONV (postoperative nausea and vomiting)    Post-viral cough syndrome 05/16/2018   Past Surgical History:  Procedure Laterality Date   CERVICAL ABLATION  2007   CERVICAL SPINE SURGERY  2014   COLONOSCOPY WITH PROPOFOL  N/A 10/19/2018   Procedure: COLONOSCOPY WITH BIOPSY;  Surgeon: Jinny Carmine, MD;  Location: [facility];  Service: Endoscopy;  Laterality: N/A;   POLYPECTOMY N/A 10/19/2018   Procedure: POLYPECTOMY;  Surgeon: Jinny Carmine, MD;  Location: [facility];  Service: Endoscopy;  Laterality: N/A;   SEPTOPLASTY  1987   Patient Active Problem List   Diagnosis Date Noted   Chronic left-sided back pain 01/10/2023   Acute right ankle pain 01/10/2023   Tobacco dependence 05/20/2022   OSA (obstructive sleep apnea) 05/01/2019   External hemorrhoid 05/01/2019   Encounter for screening colonoscopy    Polyp of ascending colon    Rectal polyp    Myopia of both eyes 05/09/2018   Presbyopia 05/09/2018   Genital herpes 09/19/2017   Frequent headaches 09/19/2017   Stress incontinence of urine 09/19/2017   Prediabetes 09/19/2017   Menopausal symptoms 09/19/2017   Migraines 09/22/2016   Chronic neck pain 09/22/2016   Anxiety  and depression 09/22/2016   Preventative health care 09/22/2016    PCP: Gretta Comer POUR, NP  REFERRING PROVIDER: Gretta Comer POUR, NP  REFERRING DIAG: M54.50,G89.29 (ICD-10-CM) - Chronic left-sided low back pain without sciatica  RATIONALE FOR EVALUATION AND TREATMENT: Rehabilitation  THERAPY DIAG: Other low back pain  Muscle weakness (generalized)  ONSET DATE: Late October  FOLLOW-UP APPT SCHEDULED WITH REFERRING PROVIDER: Yes   FROM INITIAL EVALUATION SUBJECTIVE:  SUBJECTIVE STATEMENT:  Low back pain and R foot weakness  PERTINENT HISTORY:  Pt referred for acute on chronic low back pain. She suffered a fall approximately 6 weeks ago with worsening of left low back pain and sudden R foot dorsiflexion weakness and numbness.  During the fall pt had a R ankle inversion sprain with subsequent swelling which has mostly resolved. She now complains of numbness in entire R foot up to the ankle. Symptoms have remained unchanged over the last few weeks. Pt has a history of a low back fracture playing softball in seventh grade with chronic low back pain since that time. However this is a significant acute worsening of her pain. She is currently retired and has been trying to lose weight recently. Pt has a history of chronic L hip pain as well and until about 6 months ago was seeing a land. She also has a history of a cervical fusion in 2014. She denies any saddle paresthesia or loss of bowel/bladder control.  01/10/23: DG Ankle Complete Right: IMPRESSION: Mild to moderate lateral ankle soft tissue swelling versus patient body habitus. No acute fracture is seen.  01/10/23: EXAM: LUMBAR SPINE - 2-3 VIEW IMPRESSION: 1. Grade 1 anterolisthesis of L5 on S1 appears new from remote 10/10/2007 CT. 2. Chronic  bilateral L5 pars defects, also seen on prior remote 2009 CT. 3. Mild-to-moderate L5-S1 degenerative disc and endplate changes.  PAIN:    Pain Intensity: Present: 0/10, Best: 0/10, Worst: 5/10 Pain location: left low back  Pain Quality: excruciating Radiating: Yes, occasionally down RLE Numbness/Tingling: Yes, entire R foot to the ankle Focal Weakness: Yes, R ankle Aggravating factors: sit to/from stand  Relieving factors: heat, TENS unit Position of comfort: Standing 24-hour pain behavior: Improves throughout day History of prior back injury, pain, surgery, or therapy: Yes, spinal fracture in adolescence Dominant hand: left Imaging: Yes, plain films (see history); Red flags: Negative for bowel/bladder changes, saddle paresthesia, personal history of cancer, h/o spinal tumors, h/o compression fx, h/o abdominal aneurysm, abdominal pain, chills/fever, night sweats, nausea, vomiting, unrelenting pain;  PRECAUTIONS: None  WEIGHT BEARING RESTRICTIONS: No  FALLS: Has patient fallen in last 6 months? Yes. Number of falls 2  Living Environment Lives with: lives with their family, spouse and 59 year old twins Lives in: House/apartment, single level Stairs: ramp to enter Has following equipment at home: None  Prior level of function: Independent  Occupational demands: Retired airline pilot  Hobbies: caring for grandchildren, helping care for her extended family  Patient Goals: I want to be more active.    OBJECTIVE:  Patient Surveys  FOTO 56, predicted improvement to 3  Cognition Patient is oriented to person, place, and time.  Recent memory is intact.  Remote memory is intact.  Attention span and concentration are intact.  Expressive speech is intact.  Patient's fund of knowledge is within normal limits for educational level.    Gross Musculoskeletal Assessment Tremor: None Bulk: Normal Tone: Normal No visible step-off along spinal column, no signs of scoliosis Mild  swelling noted in R ankle just superior to lateral malleoli.  GAIT: Deferred full gait assessment  Posture: Lumbar lordosis: WNL Iliac crest height: Equal bilaterally Lumbar lateral shift: Negative  AROM AROM (Normal range in degrees) AROM   Lumbar   Flexion (65) Moderately limited*  Extension (30) Severely limited*  Right lateral flexion (25) Min limitation*  Left lateral flexion (25) Severe limitation*  Right rotation (30) Min limitation  Left rotation (30) Mod limitation*  Hip Right Left  Flexion (125) WNL* WNL*  Extension (15)    Abduction (40)    Adduction     Internal Rotation (45) 45 50  External Rotation (45) 23 22      Knee    Flexion (135) WNL WNL  Extension (0) WNL WNL  (* = pain; Blank rows = not tested)  LE MMT: MMT (out of 5) Right  Left   Hip flexion 4+* (left low back pain) 5  Hip extension    Hip abduction 5 5  Hip adduction 5 5  Hip internal rotation 5 5  Hip external rotation 5 5  Knee flexion 5 5  Knee extension 5 5  Ankle dorsiflexion 3+ 5  Ankle plantarflexion Strong Strong  Ankle inversion 4 5  Ankle eversion 4 5  (* = pain; Blank rows = not tested)  Sensation Pt reports decreased sensation along L5 and S1 dermatomes of R ankle (anterior and lateral ankle). Otherwise RLE sensation intact. Proprioception, stereognosis, and hot/cold testing deferred on this date.  Reflexes R/L Knee Jerk (L3/4): 2+/2+  Ankle Jerk (S1/2): 1+/0   Muscle Length Hamstrings: R: Positive for tightness around 75 degrees L: Positive for tightness around 75 degrees [place] (quadriceps): R: Not examined L: Not examined Thomas (hip flexors): R: Not examined L: Not examined Ober: R: Not examined L: Not examined  Palpation Location Right Left         Lumbar paraspinals 1 1  Quadratus Lumborum 1 1  Gluteus Maximus 0 0  Gluteus Medius 0 0  Deep hip external rotators 0 0  PSIS 0 0  Fortin's Area (SIJ) 0 0  Greater Trochanter 0 0  (Blank rows = not  tested) Graded on 0-4 scale (0 = no pain, 1 = pain, 2 = pain with wincing/grimacing/flinching, 3 = pain with withdrawal, 4 = unwilling to allow palpation)  Passive Accessory Intervertebral Motion Pt reports reproduction of low back pain and L hip pain with CPA L3-L5 , R UPA L3-L5, and L UPA L2-L5. Unable to truly assess mobility secondary to pain;  Special Tests Lumbar Radiculopathy and Discogenic: Centralization and Peripheralization (SN 92, -LR 0.12): Not examined Slump (SN 83, -LR 0.32): R: Positive for low back pain L: Negative SLR (SN 92, -LR 0.29): R: Negative L:  Negative Crossed SLR (SP 90): R: Negative L: Negative  Facet Joint: Extension-Rotation (SN 100, -LR 0.0): R: Negative L: Negative  Lumbar Foraminal Stenosis: Lumbar quadrant (SN 70): R: Positive L: Negative  Hip: FABER (SN 81): R: Positive for low back pain L: Positive for low back pain FADIR (SN 94): R: Negative L: Negative Hip scour (SN 50): R: Negative L: Positive for low back pain  SIJ:  Thigh Thrust (SN 88, -LR 0.18) : R: Not examined  L: Not examined  Piriformis Syndrome: FAIR Test (SN 88, SP 83): R: Not examined L: Not examined  Functional Tasks Deferred  Beighton scale Deferred   TODAY'S TREATMENT:   SUBJECTIVE: Pt reports that she is in a lot of pain today. She fell yesterday while trying to prevent her uncle from falling when going up stairs. She denies any overt injury but reports an increase in back pain since the fall. Pt is reporting 8-9/10 low back pain upon arrival today.    PAIN: 8-9/10 low back pain   Manual Therapy  Prone grade I-II lumbar mobilizations L1-L5, 30s/bout x 2 bouts/level; Extensive STM to lumbar paraspinals and QL with patient in prone using effleurage and counterstrain  techniques;   Electrical Stimulation  In prone applied IFC with 2 channels in cross pattern to lower lumbar spine at pt tolerated intensity without muscle contraction (7.8 initially progressed to 9.2  after first 5 min) x 10 minutes with moist heat on low back;   Not performed: L sidelying R lumbar gapping mobilization 3 x 30s; Seated sciatic nerve glides x 10 BLE; Hooklying lumbar rotation rocking 3s hold x 10 each direction; Hooklying posterior pelvic tilts 3s hold x 10; Hooklying posterior pelvic tilt with marching x 10 BLE; Hooklying bridges with focus on glut contraction initiating with posterior tilt x 10; Hooklying posterior pelvic tilts with alternating bicycles x 10 BLE (very challenging for patient);   PATIENT EDUCATION:  Education details: Pt educated throughout session about proper posture and technique with exercises. Improved exercise technique, movement at target joints, use of target muscles after min to mod verbal, visual, tactile cues.  Person educated: Patient Education method: Explanation, Tactile cues, and Verbal cues Education comprehension: verbalized understanding, returned demonstration, and verbal cues required   HOME EXERCISE PROGRAM:  Access Code: [email or web address] URL: [email or web address] Date: 02/09/2023 Prepared by: Selinda Eck  Exercises - Supine Posterior Pelvic Tilt  - 1-2 x daily - 7 x weekly - 2 sets - 10 reps - 3-5s hold - Supine Knee Rocks  - 1-2 x daily - 7 x weekly - 2 sets - 10 reps - 3-5s hold - Standing Distal Sciatic Nerve Mobilization on Step  - 1-2 x daily - 7 x weekly - 2 sets - 10 reps - 3-5s hold   ASSESSMENT:  CLINICAL IMPRESSION: Pt with significant increase in pain from fall yesterday. Focused on pain control modalities including estim and soft tissue techniques. Deferred strengthening for future session once pain is improved. Pt reports slight improvement in pain at end of session. No HEP modifications today. Pt encouraged to follow-up as scheduled. Pt will benefit from PT services to address deficits in strength, ROM, and pain in order to return to full function at home with less back pain.  OBJECTIVE IMPAIRMENTS:  decreased activity tolerance, decreased strength, obesity, and pain.   ACTIVITY LIMITATIONS: lifting, bending, sitting, squatting, and caring for others  PARTICIPATION LIMITATIONS: meal prep, cleaning, laundry, and community activity  PERSONAL FACTORS: Past/current experiences, Time since onset of injury/illness/exacerbation, and 3+ comorbidities: OA, migraines, and obesity  are also affecting patient's functional outcome.   REHAB POTENTIAL: Fair    CLINICAL DECISION MAKING: Unstable/unpredictable  EVALUATION COMPLEXITY: High   GOALS: Goals reviewed with patient? No  SHORT TERM GOALS: Target date: 03/04/2023   Pt will be independent with HEP in order to improve strength and decrease back pain to improve pain-free function at home and work. Baseline:  Goal status: INITIAL   LONG TERM GOALS: Target date: 04/01/2023  Pt will increase FOTO to at least 68 to demonstrate significant improvement in function at home and work related to back pain  Baseline: 56 Goal status: INITIAL  2.  Pt will decrease worst back pain by at least 2 points on the NPRS in order to demonstrate clinically significant reduction in back pain. Baseline: 5/10 Goal status: INITIAL  3.  Pt will decrease mODI score by at least 13 points in order demonstrate clinically significant reduction in back pain/disability.       Baseline: To be completed Goal status: INITIAL  4. Pt will increase strength of R ankle dorsiflexion to at least 4+/5 MMT grade in order to demonstrate improvement in strength and function  Baseline: 3+/5 Goal status: INITIAL   PLAN: PT FREQUENCY: 1-2x/week  PT DURATION: 8 weeks  PLANNED INTERVENTIONS: Therapeutic exercises, Therapeutic activity, Neuromuscular re-education, Balance training, Gait training, Patient/Family education, Self Care, Joint mobilization, Joint manipulation, Vestibular training, Canalith repositioning, Orthotic/Fit training, DME instructions, Dry Needling, Electrical  stimulation, Spinal manipulation, Spinal mobilization, Cryotherapy, Moist heat, Taping, Traction, Ultrasound, Ionotophoresis 4mg /ml Dexamethasone, Manual therapy, and Re-evaluation.  PLAN FOR NEXT SESSION: complete MODI, progress stabilization exercises, assess response to manual techniques and progress as appropriate.   Ronesha Heenan D Catrena Vari PT, DPT, GCS  Makaley Storts, PT 03/11/2023, 8:11 PM

## 2023-03-11 ENCOUNTER — Ambulatory Visit: Payer: 59

## 2023-03-11 DIAGNOSIS — M6281 Muscle weakness (generalized): Secondary | ICD-10-CM

## 2023-03-11 DIAGNOSIS — M5459 Other low back pain: Secondary | ICD-10-CM | POA: Diagnosis not present

## 2023-03-11 NOTE — Therapy (Signed)
 OUTPATIENT PHYSICAL THERAPY THORACOLUMBAR TREATMENT  Patient Name: Brittany Pham MRN: [id number] DOB:07-Mar-1965, 58 y.o., female Today's Date: 03/11/2023  END OF SESSION:  PT End of Session - 03/11/23 0845     Visit Number 9    Number of Visits 17    Date for PT Re-Evaluation 04/01/23    Authorization Type eval: 02/04/23    PT Start Time 0845    PT Stop Time 0930    PT Time Calculation (min) 45 min    Activity Tolerance Patient tolerated treatment well    Behavior During Therapy [facility] for tasks assessed/performed            Past Medical History:  Diagnosis Date   Arthritis    Chickenpox    Complication of anesthesia    nausea after septoplasty   Depression    GERD (gastroesophageal reflux disease)    Migraines    2-3 per month   PONV (postoperative nausea and vomiting)    Post-viral cough syndrome 05/16/2018   Past Surgical History:  Procedure Laterality Date   CERVICAL ABLATION  2007   CERVICAL SPINE SURGERY  2014   COLONOSCOPY WITH PROPOFOL  N/A 10/19/2018   Procedure: COLONOSCOPY WITH BIOPSY;  Surgeon: Jinny Carmine, MD;  Location: [facility];  Service: Endoscopy;  Laterality: N/A;   POLYPECTOMY N/A 10/19/2018   Procedure: POLYPECTOMY;  Surgeon: Jinny Carmine, MD;  Location: [facility];  Service: Endoscopy;  Laterality: N/A;   SEPTOPLASTY  1987   Patient Active Problem List   Diagnosis Date Noted   Chronic left-sided back pain 01/10/2023   Acute right ankle pain 01/10/2023   Tobacco dependence 05/20/2022   OSA (obstructive sleep apnea) 05/01/2019   External hemorrhoid 05/01/2019   Encounter for screening colonoscopy    Polyp of ascending colon    Rectal polyp    Myopia of both eyes 05/09/2018   Presbyopia 05/09/2018   Genital herpes 09/19/2017   Frequent headaches 09/19/2017   Stress incontinence of urine 09/19/2017   Prediabetes 09/19/2017   Menopausal symptoms 09/19/2017   Migraines 09/22/2016   Chronic neck pain 09/22/2016   Anxiety  and depression 09/22/2016   Preventative health care 09/22/2016    PCP: Gretta Comer POUR, NP  REFERRING PROVIDER: Gretta Comer POUR, NP  REFERRING DIAG: M54.50,G89.29 (ICD-10-CM) - Chronic left-sided low back pain without sciatica  RATIONALE FOR EVALUATION AND TREATMENT: Rehabilitation  THERAPY DIAG: Other low back pain  Muscle weakness (generalized)  ONSET DATE: Late October  FOLLOW-UP APPT SCHEDULED WITH REFERRING PROVIDER: Yes   FROM INITIAL EVALUATION SUBJECTIVE:  SUBJECTIVE STATEMENT:  Low back pain and R foot weakness  PERTINENT HISTORY:  Pt referred for acute on chronic low back pain. She suffered a fall approximately 6 weeks ago with worsening of left low back pain and sudden R foot dorsiflexion weakness and numbness.  During the fall pt had a R ankle inversion sprain with subsequent swelling which has mostly resolved. She now complains of numbness in entire R foot up to the ankle. Symptoms have remained unchanged over the last few weeks. Pt has a history of a low back fracture playing softball in seventh grade with chronic low back pain since that time. However this is a significant acute worsening of her pain. She is currently retired and has been trying to lose weight recently. Pt has a history of chronic L hip pain as well and until about 6 months ago was seeing a land. She also has a history of a cervical fusion in 2014. She denies any saddle paresthesia or loss of bowel/bladder control.  01/10/23: DG Ankle Complete Right: IMPRESSION: Mild to moderate lateral ankle soft tissue swelling versus patient body habitus. No acute fracture is seen.  01/10/23: EXAM: LUMBAR SPINE - 2-3 VIEW IMPRESSION: 1. Grade 1 anterolisthesis of L5 on S1 appears new from remote 10/10/2007 CT. 2. Chronic  bilateral L5 pars defects, also seen on prior remote 2009 CT. 3. Mild-to-moderate L5-S1 degenerative disc and endplate changes.  PAIN:    Pain Intensity: Present: 0/10, Best: 0/10, Worst: 5/10 Pain location: left low back  Pain Quality: excruciating Radiating: Yes, occasionally down RLE Numbness/Tingling: Yes, entire R foot to the ankle Focal Weakness: Yes, R ankle Aggravating factors: sit to/from stand  Relieving factors: heat, TENS unit Position of comfort: Standing 24-hour pain behavior: Improves throughout day History of prior back injury, pain, surgery, or therapy: Yes, spinal fracture in adolescence Dominant hand: left Imaging: Yes, plain films (see history); Red flags: Negative for bowel/bladder changes, saddle paresthesia, personal history of cancer, h/o spinal tumors, h/o compression fx, h/o abdominal aneurysm, abdominal pain, chills/fever, night sweats, nausea, vomiting, unrelenting pain;  PRECAUTIONS: None  WEIGHT BEARING RESTRICTIONS: No  FALLS: Has patient fallen in last 6 months? Yes. Number of falls 2  Living Environment Lives with: lives with their family, spouse and 12 year old twins Lives in: House/apartment, single level Stairs: ramp to enter Has following equipment at home: None  Prior level of function: Independent  Occupational demands: Retired airline pilot  Hobbies: caring for grandchildren, helping care for her extended family  Patient Goals: I want to be more active.    OBJECTIVE:  Patient Surveys  FOTO 56, predicted improvement to 5  Cognition Patient is oriented to person, place, and time.  Recent memory is intact.  Remote memory is intact.  Attention span and concentration are intact.  Expressive speech is intact.  Patient's fund of knowledge is within normal limits for educational level.    Gross Musculoskeletal Assessment Tremor: None Bulk: Normal Tone: Normal No visible step-off along spinal column, no signs of scoliosis Mild  swelling noted in R ankle just superior to lateral malleoli.  GAIT: Deferred full gait assessment  Posture: Lumbar lordosis: WNL Iliac crest height: Equal bilaterally Lumbar lateral shift: Negative  AROM AROM (Normal range in degrees) AROM   Lumbar   Flexion (65) Moderately limited*  Extension (30) Severely limited*  Right lateral flexion (25) Min limitation*  Left lateral flexion (25) Severe limitation*  Right rotation (30) Min limitation  Left rotation (30) Mod limitation*  Hip Right Left  Flexion (125) WNL* WNL*  Extension (15)    Abduction (40)    Adduction     Internal Rotation (45) 45 50  External Rotation (45) 23 22      Knee    Flexion (135) WNL WNL  Extension (0) WNL WNL  (* = pain; Blank rows = not tested)  LE MMT: MMT (out of 5) Right  Left   Hip flexion 4+* (left low back pain) 5  Hip extension    Hip abduction 5 5  Hip adduction 5 5  Hip internal rotation 5 5  Hip external rotation 5 5  Knee flexion 5 5  Knee extension 5 5  Ankle dorsiflexion 3+ 5  Ankle plantarflexion Strong Strong  Ankle inversion 4 5  Ankle eversion 4 5  (* = pain; Blank rows = not tested)  Sensation Pt reports decreased sensation along L5 and S1 dermatomes of R ankle (anterior and lateral ankle). Otherwise RLE sensation intact. Proprioception, stereognosis, and hot/cold testing deferred on this date.  Reflexes R/L Knee Jerk (L3/4): 2+/2+  Ankle Jerk (S1/2): 1+/0   Muscle Length Hamstrings: R: Positive for tightness around 75 degrees L: Positive for tightness around 75 degrees [place] (quadriceps): R: Not examined L: Not examined Thomas (hip flexors): R: Not examined L: Not examined Ober: R: Not examined L: Not examined  Palpation Location Right Left         Lumbar paraspinals 1 1  Quadratus Lumborum 1 1  Gluteus Maximus 0 0  Gluteus Medius 0 0  Deep hip external rotators 0 0  PSIS 0 0  Fortin's Area (SIJ) 0 0  Greater Trochanter 0 0  (Blank rows = not  tested) Graded on 0-4 scale (0 = no pain, 1 = pain, 2 = pain with wincing/grimacing/flinching, 3 = pain with withdrawal, 4 = unwilling to allow palpation)  Passive Accessory Intervertebral Motion Pt reports reproduction of low back pain and L hip pain with CPA L3-L5 , R UPA L3-L5, and L UPA L2-L5. Unable to truly assess mobility secondary to pain;  Special Tests Lumbar Radiculopathy and Discogenic: Centralization and Peripheralization (SN 92, -LR 0.12): Not examined Slump (SN 83, -LR 0.32): R: Positive for low back pain L: Negative SLR (SN 92, -LR 0.29): R: Negative L:  Negative Crossed SLR (SP 90): R: Negative L: Negative  Facet Joint: Extension-Rotation (SN 100, -LR 0.0): R: Negative L: Negative  Lumbar Foraminal Stenosis: Lumbar quadrant (SN 70): R: Positive L: Negative  Hip: FABER (SN 81): R: Positive for low back pain L: Positive for low back pain FADIR (SN 94): R: Negative L: Negative Hip scour (SN 50): R: Negative L: Positive for low back pain  SIJ:  Thigh Thrust (SN 88, -LR 0.18) : R: Not examined  L: Not examined  Piriformis Syndrome: FAIR Test (SN 88, SP 83): R: Not examined L: Not examined  Functional Tasks Deferred  Beighton scale Deferred   TODAY'S TREATMENT:   SUBJECTIVE: Pt reports that she is doing well today. She fell 2 days ago while trying to prevent her uncle from falling when going up stairs, but is feeling better after her PT appointment yesterday. She denies any overt injury but still reports some pain in the low back. Pt is reporting 4/10 low back pain upon arrival today.    PAIN: 4/10 low back pain   Manual Therapy  Prone grade I-II lumbar mobilizations L1-L5, 30s/bout x 3 bouts/level; STM in lumbar region x 8 minutes; Hooklying  single knee to chest 30s hold BLE; Double knee to chest stretch 2 x 30s hold;   TherEx NuStep warmup L1-L4 x 9 mins with moist heat pack on low back; Hooklying lumbar rotation rocking 3s hold x 10 each  direction; Hooklying posterior pelvic tilts 3 x 10 with 3s hold; Hooklying posterior pelvic tilt with marching x 10 BLE; Hooklying bridges with focus on glut contraction initiating with posterior tilt 2 x 10;   Not performed: L sidelying R lumbar gapping mobilization 3 x 30s; Seated sciatic nerve glides x 10 BLE; Hooklying lumbar rotation rocking 3s hold x 10 each direction; Hooklying posterior pelvic tilts 3s hold x 10; Hooklying posterior pelvic tilt with marching x 10 BLE; Hooklying bridges with focus on glut contraction initiating with posterior tilt x 10; Hooklying posterior pelvic tilts with alternating bicycles x 10 BLE (very challenging for patient);   PATIENT EDUCATION:  Education details: Pt educated throughout session about proper posture and technique with exercises. Improved exercise technique, movement at target joints, use of target muscles after min to mod verbal, visual, tactile cues.  Person educated: Patient Education method: Explanation, Tactile cues, and Verbal cues Education comprehension: verbalized understanding, returned demonstration, and verbal cues required   HOME EXERCISE PROGRAM:  Access Code: [email or web address] URL: [email or web address] Date: 02/09/2023 Prepared by: Selinda Eck  Exercises - Supine Posterior Pelvic Tilt  - 1-2 x daily - 7 x weekly - 2 sets - 10 reps - 3-5s hold - Supine Knee Rocks  - 1-2 x daily - 7 x weekly - 2 sets - 10 reps - 3-5s hold - Standing Distal Sciatic Nerve Mobilization on Step  - 1-2 x daily - 7 x weekly - 2 sets - 10 reps - 3-5s hold   ASSESSMENT:  CLINICAL IMPRESSION: Pt demonstrates excellent motivation during session. Continued manual techniques for decompression. Pt noted some discomfort with posterior pelvic tilts with marching but pain remained below 4/10 NPS. Overall pt felt loosened up and noted decreased overall pain since yesterday. No HEP modifications today. Pt encouraged to follow-up as  scheduled. Pt will benefit from PT services to address deficits in strength, ROM, and pain in order to return to full function at home with less back pain.  OBJECTIVE IMPAIRMENTS: decreased activity tolerance, decreased strength, obesity, and pain.   ACTIVITY LIMITATIONS: lifting, bending, sitting, squatting, and caring for others  PARTICIPATION LIMITATIONS: meal prep, cleaning, laundry, and community activity  PERSONAL FACTORS: Past/current experiences, Time since onset of injury/illness/exacerbation, and 3+ comorbidities: OA, migraines, and obesity  are also affecting patient's functional outcome.   REHAB POTENTIAL: Fair    CLINICAL DECISION MAKING: Unstable/unpredictable  EVALUATION COMPLEXITY: High   GOALS: Goals reviewed with patient? No  SHORT TERM GOALS: Target date: 03/04/2023   Pt will be independent with HEP in order to improve strength and decrease back pain to improve pain-free function at home and work. Baseline:  Goal status: INITIAL   LONG TERM GOALS: Target date: 04/01/2023  Pt will increase FOTO to at least 68 to demonstrate significant improvement in function at home and work related to back pain  Baseline: 56 Goal status: INITIAL  2.  Pt will decrease worst back pain by at least 2 points on the NPRS in order to demonstrate clinically significant reduction in back pain. Baseline: 5/10 Goal status: INITIAL  3.  Pt will decrease mODI score by at least 13 points in order demonstrate clinically significant reduction in back pain/disability.       Baseline: To be  completed Goal status: INITIAL  4. Pt will increase strength of R ankle dorsiflexion to at least 4+/5 MMT grade in order to demonstrate improvement in strength and function  Baseline: 3+/5 Goal status: INITIAL   PLAN: PT FREQUENCY: 1-2x/week  PT DURATION: 8 weeks  PLANNED INTERVENTIONS: Therapeutic exercises, Therapeutic activity, Neuromuscular re-education, Balance training, Gait training,  Patient/Family education, Self Care, Joint mobilization, Joint manipulation, Vestibular training, Canalith repositioning, Orthotic/Fit training, DME instructions, Dry Needling, Electrical stimulation, Spinal manipulation, Spinal mobilization, Cryotherapy, Moist heat, Taping, Traction, Ultrasound, Ionotophoresis 4mg /ml Dexamethasone, Manual therapy, and Re-evaluation.  PLAN FOR NEXT SESSION: complete MODI, progress stabilization exercises, assess response to manual techniques and progress as appropriate.  This entire session was performed under direct supervision and direction of a licensed therapist/therapist assistant . I have personally read, edited and approve of the note as written.   Madilyn Treon SPT [employer] DPTE Jo-Ann Johanning D Drayke Grabel PT, DPT, GCS  Liylah Najarro, PT 03/11/2023, 11:10 AM

## 2023-03-11 NOTE — Therapy (Deleted)
 OUTPATIENT PHYSICAL THERAPY THORACOLUMBAR TREATMENT  Patient Name: Brittany Pham MRN: [id number] DOB:1965/04/11, 58 y.o., female Today's Date: 03/11/2023  END OF SESSION:  PT End of Session - 03/11/23 0845     Visit Number 9    Number of Visits 17    Date for PT Re-Evaluation 04/01/23    Authorization Type eval: 02/04/23    PT Start Time 0845    PT Stop Time 0930    PT Time Calculation (min) 45 min    Activity Tolerance Patient tolerated treatment well    Behavior During Therapy [facility] for tasks assessed/performed            Past Medical History:  Diagnosis Date   Arthritis    Chickenpox    Complication of anesthesia    nausea after septoplasty   Depression    GERD (gastroesophageal reflux disease)    Migraines    2-3 per month   PONV (postoperative nausea and vomiting)    Post-viral cough syndrome 05/16/2018   Past Surgical History:  Procedure Laterality Date   CERVICAL ABLATION  2007   CERVICAL SPINE SURGERY  2014   COLONOSCOPY WITH PROPOFOL  N/A 10/19/2018   Procedure: COLONOSCOPY WITH BIOPSY;  Surgeon: Jinny Carmine, MD;  Location: [facility];  Service: Endoscopy;  Laterality: N/A;   POLYPECTOMY N/A 10/19/2018   Procedure: POLYPECTOMY;  Surgeon: Jinny Carmine, MD;  Location: [facility];  Service: Endoscopy;  Laterality: N/A;   SEPTOPLASTY  1987   Patient Active Problem List   Diagnosis Date Noted   Chronic left-sided back pain 01/10/2023   Acute right ankle pain 01/10/2023   Tobacco dependence 05/20/2022   OSA (obstructive sleep apnea) 05/01/2019   External hemorrhoid 05/01/2019   Encounter for screening colonoscopy    Polyp of ascending colon    Rectal polyp    Myopia of both eyes 05/09/2018   Presbyopia 05/09/2018   Genital herpes 09/19/2017   Frequent headaches 09/19/2017   Stress incontinence of urine 09/19/2017   Prediabetes 09/19/2017   Menopausal symptoms 09/19/2017   Migraines 09/22/2016   Chronic neck pain 09/22/2016   Anxiety  and depression 09/22/2016   Preventative health care 09/22/2016    PCP: Gretta Comer POUR, NP  REFERRING PROVIDER: Gretta Comer POUR, NP  REFERRING DIAG: M54.50,G89.29 (ICD-10-CM) - Chronic left-sided low back pain without sciatica  RATIONALE FOR EVALUATION AND TREATMENT: Rehabilitation  THERAPY DIAG: Other low back pain  Muscle weakness (generalized)  ONSET DATE: Late October  FOLLOW-UP APPT SCHEDULED WITH REFERRING PROVIDER: Yes   FROM INITIAL EVALUATION SUBJECTIVE:  SUBJECTIVE STATEMENT:  Low back pain and R foot weakness  PERTINENT HISTORY:  Pt referred for acute on chronic low back pain. She suffered a fall approximately 6 weeks ago with worsening of left low back pain and sudden R foot dorsiflexion weakness and numbness.  During the fall pt had a R ankle inversion sprain with subsequent swelling which has mostly resolved. She now complains of numbness in entire R foot up to the ankle. Symptoms have remained unchanged over the last few weeks. Pt has a history of a low back fracture playing softball in seventh grade with chronic low back pain since that time. However this is a significant acute worsening of her pain. She is currently retired and has been trying to lose weight recently. Pt has a history of chronic L hip pain as well and until about 6 months ago was seeing a land. She also has a history of a cervical fusion in 2014. She denies any saddle paresthesia or loss of bowel/bladder control.  01/10/23: DG Ankle Complete Right: IMPRESSION: Mild to moderate lateral ankle soft tissue swelling versus patient body habitus. No acute fracture is seen.  01/10/23: EXAM: LUMBAR SPINE - 2-3 VIEW IMPRESSION: 1. Grade 1 anterolisthesis of L5 on S1 appears new from remote 10/10/2007 CT. 2. Chronic  bilateral L5 pars defects, also seen on prior remote 2009 CT. 3. Mild-to-moderate L5-S1 degenerative disc and endplate changes.  PAIN:    Pain Intensity: Present: 0/10, Best: 0/10, Worst: 5/10 Pain location: left low back  Pain Quality: excruciating Radiating: Yes, occasionally down RLE Numbness/Tingling: Yes, entire R foot to the ankle Focal Weakness: Yes, R ankle Aggravating factors: sit to/from stand  Relieving factors: heat, TENS unit Position of comfort: Standing 24-hour pain behavior: Improves throughout day History of prior back injury, pain, surgery, or therapy: Yes, spinal fracture in adolescence Dominant hand: left Imaging: Yes, plain films (see history); Red flags: Negative for bowel/bladder changes, saddle paresthesia, personal history of cancer, h/o spinal tumors, h/o compression fx, h/o abdominal aneurysm, abdominal pain, chills/fever, night sweats, nausea, vomiting, unrelenting pain;  PRECAUTIONS: None  WEIGHT BEARING RESTRICTIONS: No  FALLS: Has patient fallen in last 6 months? Yes. Number of falls 2  Living Environment Lives with: lives with their family, spouse and 57 year old twins Lives in: House/apartment, single level Stairs: ramp to enter Has following equipment at home: None  Prior level of function: Independent  Occupational demands: Retired airline pilot  Hobbies: caring for grandchildren, helping care for her extended family  Patient Goals: I want to be more active.    OBJECTIVE:  Patient Surveys  FOTO 56, predicted improvement to 75  Cognition Patient is oriented to person, place, and time.  Recent memory is intact.  Remote memory is intact.  Attention span and concentration are intact.  Expressive speech is intact.  Patient's fund of knowledge is within normal limits for educational level.    Gross Musculoskeletal Assessment Tremor: None Bulk: Normal Tone: Normal No visible step-off along spinal column, no signs of scoliosis Mild  swelling noted in R ankle just superior to lateral malleoli.  GAIT: Deferred full gait assessment  Posture: Lumbar lordosis: WNL Iliac crest height: Equal bilaterally Lumbar lateral shift: Negative  AROM AROM (Normal range in degrees) AROM   Lumbar   Flexion (65) Moderately limited*  Extension (30) Severely limited*  Right lateral flexion (25) Min limitation*  Left lateral flexion (25) Severe limitation*  Right rotation (30) Min limitation  Left rotation (30) Mod limitation*  Hip Right Left  Flexion (125) WNL* WNL*  Extension (15)    Abduction (40)    Adduction     Internal Rotation (45) 45 50  External Rotation (45) 23 22      Knee    Flexion (135) WNL WNL  Extension (0) WNL WNL  (* = pain; Blank rows = not tested)  LE MMT: MMT (out of 5) Right  Left   Hip flexion 4+* (left low back pain) 5  Hip extension    Hip abduction 5 5  Hip adduction 5 5  Hip internal rotation 5 5  Hip external rotation 5 5  Knee flexion 5 5  Knee extension 5 5  Ankle dorsiflexion 3+ 5  Ankle plantarflexion Strong Strong  Ankle inversion 4 5  Ankle eversion 4 5  (* = pain; Blank rows = not tested)  Sensation Pt reports decreased sensation along L5 and S1 dermatomes of R ankle (anterior and lateral ankle). Otherwise RLE sensation intact. Proprioception, stereognosis, and hot/cold testing deferred on this date.  Reflexes R/L Knee Jerk (L3/4): 2+/2+  Ankle Jerk (S1/2): 1+/0   Muscle Length Hamstrings: R: Positive for tightness around 75 degrees L: Positive for tightness around 75 degrees [place] (quadriceps): R: Not examined L: Not examined Thomas (hip flexors): R: Not examined L: Not examined Ober: R: Not examined L: Not examined  Palpation Location Right Left         Lumbar paraspinals 1 1  Quadratus Lumborum 1 1  Gluteus Maximus 0 0  Gluteus Medius 0 0  Deep hip external rotators 0 0  PSIS 0 0  Fortin's Area (SIJ) 0 0  Greater Trochanter 0 0  (Blank rows = not  tested) Graded on 0-4 scale (0 = no pain, 1 = pain, 2 = pain with wincing/grimacing/flinching, 3 = pain with withdrawal, 4 = unwilling to allow palpation)  Passive Accessory Intervertebral Motion Pt reports reproduction of low back pain and L hip pain with CPA L3-L5 , R UPA L3-L5, and L UPA L2-L5. Unable to truly assess mobility secondary to pain;  Special Tests Lumbar Radiculopathy and Discogenic: Centralization and Peripheralization (SN 92, -LR 0.12): Not examined Slump (SN 83, -LR 0.32): R: Positive for low back pain L: Negative SLR (SN 92, -LR 0.29): R: Negative L:  Negative Crossed SLR (SP 90): R: Negative L: Negative  Facet Joint: Extension-Rotation (SN 100, -LR 0.0): R: Negative L: Negative  Lumbar Foraminal Stenosis: Lumbar quadrant (SN 70): R: Positive L: Negative  Hip: FABER (SN 81): R: Positive for low back pain L: Positive for low back pain FADIR (SN 94): R: Negative L: Negative Hip scour (SN 50): R: Negative L: Positive for low back pain  SIJ:  Thigh Thrust (SN 88, -LR 0.18) : R: Not examined  L: Not examined  Piriformis Syndrome: FAIR Test (SN 88, SP 83): R: Not examined L: Not examined  Functional Tasks Deferred  Beighton scale Deferred   TODAY'S TREATMENT:   SUBJECTIVE: Pt reports that she is doing well today. She fell 2 days ago while trying to prevent her uncle from falling when going up stairs, but is feeling better after her PT appointment yesterday. She denies any overt injury but still reports some pain in the low back. Pt is reporting 4/10 low back pain upon arrival today.    PAIN: 4/10 low back pain   Manual Therapy  Prone grade I-II lumbar mobilizations L1-L5, 30s/bout x 3 bouts/level; STM in lumbar region x 8 minutes; Hooklying  single knee to chest 30s hold BLE; Double knee to chest stretch 2 x 30s hold;   TherEx NuStep warmup L1-L4 x 9 mins with moist heat pack on low back; Hooklying lumbar rotation rocking 3s hold x 10 each  direction; Hooklying posterior pelvic tilts 3 x 10 with 3s hold; Hooklying posterior pelvic tilt with marching x 10 BLE; Hooklying bridges with focus on glut contraction initiating with posterior tilt 2 x 10;   Not performed: L sidelying R lumbar gapping mobilization 3 x 30s; Seated sciatic nerve glides x 10 BLE; Hooklying lumbar rotation rocking 3s hold x 10 each direction; Hooklying posterior pelvic tilts 3s hold x 10; Hooklying posterior pelvic tilt with marching x 10 BLE; Hooklying bridges with focus on glut contraction initiating with posterior tilt x 10; Hooklying posterior pelvic tilts with alternating bicycles x 10 BLE (very challenging for patient);   PATIENT EDUCATION:  Education details: Pt educated throughout session about proper posture and technique with exercises. Improved exercise technique, movement at target joints, use of target muscles after min to mod verbal, visual, tactile cues.  Person educated: Patient Education method: Explanation, Tactile cues, and Verbal cues Education comprehension: verbalized understanding, returned demonstration, and verbal cues required   HOME EXERCISE PROGRAM:  Access Code: [email or web address] URL: [email or web address] Date: 02/09/2023 Prepared by: Selinda Eck  Exercises - Supine Posterior Pelvic Tilt  - 1-2 x daily - 7 x weekly - 2 sets - 10 reps - 3-5s hold - Supine Knee Rocks  - 1-2 x daily - 7 x weekly - 2 sets - 10 reps - 3-5s hold - Standing Distal Sciatic Nerve Mobilization on Step  - 1-2 x daily - 7 x weekly - 2 sets - 10 reps - 3-5s hold   ASSESSMENT:  CLINICAL IMPRESSION: Pt demonstrates excellent motivation during session. Continued manual techniques for decompression. Pt noted some discomfort with posterior pelvic tilts with marching but pain remained below 4/10 NPS. Overall pt felt loosened up and noted decreased overall pain since yesterday. No HEP modifications today. Pt encouraged to follow-up as  scheduled. Pt will benefit from PT services to address deficits in strength, ROM, and pain in order to return to full function at home with less back pain.  OBJECTIVE IMPAIRMENTS: decreased activity tolerance, decreased strength, obesity, and pain.   ACTIVITY LIMITATIONS: lifting, bending, sitting, squatting, and caring for others  PARTICIPATION LIMITATIONS: meal prep, cleaning, laundry, and community activity  PERSONAL FACTORS: Past/current experiences, Time since onset of injury/illness/exacerbation, and 3+ comorbidities: OA, migraines, and obesity  are also affecting patient's functional outcome.   REHAB POTENTIAL: Fair    CLINICAL DECISION MAKING: Unstable/unpredictable  EVALUATION COMPLEXITY: High   GOALS: Goals reviewed with patient? No  SHORT TERM GOALS: Target date: 03/04/2023   Pt will be independent with HEP in order to improve strength and decrease back pain to improve pain-free function at home and work. Baseline:  Goal status: INITIAL   LONG TERM GOALS: Target date: 04/01/2023  Pt will increase FOTO to at least 68 to demonstrate significant improvement in function at home and work related to back pain  Baseline: 56 Goal status: INITIAL  2.  Pt will decrease worst back pain by at least 2 points on the NPRS in order to demonstrate clinically significant reduction in back pain. Baseline: 5/10 Goal status: INITIAL  3.  Pt will decrease mODI score by at least 13 points in order demonstrate clinically significant reduction in back pain/disability.       Baseline: To be  completed Goal status: INITIAL  4. Pt will increase strength of R ankle dorsiflexion to at least 4+/5 MMT grade in order to demonstrate improvement in strength and function  Baseline: 3+/5 Goal status: INITIAL   PLAN: PT FREQUENCY: 1-2x/week  PT DURATION: 8 weeks  PLANNED INTERVENTIONS: Therapeutic exercises, Therapeutic activity, Neuromuscular re-education, Balance training, Gait training,  Patient/Family education, Self Care, Joint mobilization, Joint manipulation, Vestibular training, Canalith repositioning, Orthotic/Fit training, DME instructions, Dry Needling, Electrical stimulation, Spinal manipulation, Spinal mobilization, Cryotherapy, Moist heat, Taping, Traction, Ultrasound, Ionotophoresis 4mg /ml Dexamethasone, Manual therapy, and Re-evaluation.  PLAN FOR NEXT SESSION: complete MODI, progress stabilization exercises, assess response to manual techniques and progress as appropriate.  Gal Feldhaus, SPT [employer] DP[facility]  Jason D Huprich PT, DPT, GCS  Huprich,Jason, PT 03/11/2023, 11:03 AM

## 2023-03-11 NOTE — Therapy (Deleted)
 OUTPATIENT PHYSICAL THERAPY THORACOLUMBAR TREATMENT  Patient Name: Brittany Pham MRN: [id number] DOB:1966-02-05, 58 y.o., female Today's Date: 03/11/2023  END OF SESSION:  PT End of Session - 03/11/23 0845     Visit Number 9    Number of Visits 17    Date for PT Re-Evaluation 04/01/23    Authorization Type eval: 02/04/23    PT Start Time 0845    PT Stop Time 0930    PT Time Calculation (min) 45 min    Activity Tolerance Patient tolerated treatment well    Behavior During Therapy [facility] for tasks assessed/performed            Past Medical History:  Diagnosis Date   Arthritis    Chickenpox    Complication of anesthesia    nausea after septoplasty   Depression    GERD (gastroesophageal reflux disease)    Migraines    2-3 per month   PONV (postoperative nausea and vomiting)    Post-viral cough syndrome 05/16/2018   Past Surgical History:  Procedure Laterality Date   CERVICAL ABLATION  2007   CERVICAL SPINE SURGERY  2014   COLONOSCOPY WITH PROPOFOL  N/A 10/19/2018   Procedure: COLONOSCOPY WITH BIOPSY;  Surgeon: Jinny Carmine, MD;  Location: [facility];  Service: Endoscopy;  Laterality: N/A;   POLYPECTOMY N/A 10/19/2018   Procedure: POLYPECTOMY;  Surgeon: Jinny Carmine, MD;  Location: [facility];  Service: Endoscopy;  Laterality: N/A;   SEPTOPLASTY  1987   Patient Active Problem List   Diagnosis Date Noted   Chronic left-sided back pain 01/10/2023   Acute right ankle pain 01/10/2023   Tobacco dependence 05/20/2022   OSA (obstructive sleep apnea) 05/01/2019   External hemorrhoid 05/01/2019   Encounter for screening colonoscopy    Polyp of ascending colon    Rectal polyp    Myopia of both eyes 05/09/2018   Presbyopia 05/09/2018   Genital herpes 09/19/2017   Frequent headaches 09/19/2017   Stress incontinence of urine 09/19/2017   Prediabetes 09/19/2017   Menopausal symptoms 09/19/2017   Migraines 09/22/2016   Chronic neck pain 09/22/2016   Anxiety  and depression 09/22/2016   Preventative health care 09/22/2016    PCP: Gretta Comer POUR, NP  REFERRING PROVIDER: Gretta Comer POUR, NP  REFERRING DIAG: M54.50,G89.29 (ICD-10-CM) - Chronic left-sided low back pain without sciatica  RATIONALE FOR EVALUATION AND TREATMENT: Rehabilitation  THERAPY DIAG: Other low back pain  Muscle weakness (generalized)  ONSET DATE: Late October  FOLLOW-UP APPT SCHEDULED WITH REFERRING PROVIDER: Yes   FROM INITIAL EVALUATION SUBJECTIVE:  SUBJECTIVE STATEMENT:  Low back pain and R foot weakness  PERTINENT HISTORY:  Pt referred for acute on chronic low back pain. She suffered a fall approximately 6 weeks ago with worsening of left low back pain and sudden R foot dorsiflexion weakness and numbness.  During the fall pt had a R ankle inversion sprain with subsequent swelling which has mostly resolved. She now complains of numbness in entire R foot up to the ankle. Symptoms have remained unchanged over the last few weeks. Pt has a history of a low back fracture playing softball in seventh grade with chronic low back pain since that time. However this is a significant acute worsening of her pain. She is currently retired and has been trying to lose weight recently. Pt has a history of chronic L hip pain as well and until about 6 months ago was seeing a land. She also has a history of a cervical fusion in 2014. She denies any saddle paresthesia or loss of bowel/bladder control.  01/10/23: DG Ankle Complete Right: IMPRESSION: Mild to moderate lateral ankle soft tissue swelling versus patient body habitus. No acute fracture is seen.  01/10/23: EXAM: LUMBAR SPINE - 2-3 VIEW IMPRESSION: 1. Grade 1 anterolisthesis of L5 on S1 appears new from remote 10/10/2007 CT. 2. Chronic  bilateral L5 pars defects, also seen on prior remote 2009 CT. 3. Mild-to-moderate L5-S1 degenerative disc and endplate changes.  PAIN:    Pain Intensity: Present: 0/10, Best: 0/10, Worst: 5/10 Pain location: left low back  Pain Quality: excruciating Radiating: Yes, occasionally down RLE Numbness/Tingling: Yes, entire R foot to the ankle Focal Weakness: Yes, R ankle Aggravating factors: sit to/from stand  Relieving factors: heat, TENS unit Position of comfort: Standing 24-hour pain behavior: Improves throughout day History of prior back injury, pain, surgery, or therapy: Yes, spinal fracture in adolescence Dominant hand: left Imaging: Yes, plain films (see history); Red flags: Negative for bowel/bladder changes, saddle paresthesia, personal history of cancer, h/o spinal tumors, h/o compression fx, h/o abdominal aneurysm, abdominal pain, chills/fever, night sweats, nausea, vomiting, unrelenting pain;  PRECAUTIONS: None  WEIGHT BEARING RESTRICTIONS: No  FALLS: Has patient fallen in last 6 months? Yes. Number of falls 2  Living Environment Lives with: lives with their family, spouse and 28 year old twins Lives in: House/apartment, single level Stairs: ramp to enter Has following equipment at home: None  Prior level of function: Independent  Occupational demands: Retired airline pilot  Hobbies: caring for grandchildren, helping care for her extended family  Patient Goals: I want to be more active.    OBJECTIVE:  Patient Surveys  FOTO 56, predicted improvement to 41  Cognition Patient is oriented to person, place, and time.  Recent memory is intact.  Remote memory is intact.  Attention span and concentration are intact.  Expressive speech is intact.  Patient's fund of knowledge is within normal limits for educational level.    Gross Musculoskeletal Assessment Tremor: None Bulk: Normal Tone: Normal No visible step-off along spinal column, no signs of scoliosis Mild  swelling noted in R ankle just superior to lateral malleoli.  GAIT: Deferred full gait assessment  Posture: Lumbar lordosis: WNL Iliac crest height: Equal bilaterally Lumbar lateral shift: Negative  AROM AROM (Normal range in degrees) AROM   Lumbar   Flexion (65) Moderately limited*  Extension (30) Severely limited*  Right lateral flexion (25) Min limitation*  Left lateral flexion (25) Severe limitation*  Right rotation (30) Min limitation  Left rotation (30) Mod limitation*  Hip Right Left  Flexion (125) WNL* WNL*  Extension (15)    Abduction (40)    Adduction     Internal Rotation (45) 45 50  External Rotation (45) 23 22      Knee    Flexion (135) WNL WNL  Extension (0) WNL WNL  (* = pain; Blank rows = not tested)  LE MMT: MMT (out of 5) Right  Left   Hip flexion 4+* (left low back pain) 5  Hip extension    Hip abduction 5 5  Hip adduction 5 5  Hip internal rotation 5 5  Hip external rotation 5 5  Knee flexion 5 5  Knee extension 5 5  Ankle dorsiflexion 3+ 5  Ankle plantarflexion Strong Strong  Ankle inversion 4 5  Ankle eversion 4 5  (* = pain; Blank rows = not tested)  Sensation Pt reports decreased sensation along L5 and S1 dermatomes of R ankle (anterior and lateral ankle). Otherwise RLE sensation intact. Proprioception, stereognosis, and hot/cold testing deferred on this date.  Reflexes R/L Knee Jerk (L3/4): 2+/2+  Ankle Jerk (S1/2): 1+/0   Muscle Length Hamstrings: R: Positive for tightness around 75 degrees L: Positive for tightness around 75 degrees [place] (quadriceps): R: Not examined L: Not examined Thomas (hip flexors): R: Not examined L: Not examined Ober: R: Not examined L: Not examined  Palpation Location Right Left         Lumbar paraspinals 1 1  Quadratus Lumborum 1 1  Gluteus Maximus 0 0  Gluteus Medius 0 0  Deep hip external rotators 0 0  PSIS 0 0  Fortin's Area (SIJ) 0 0  Greater Trochanter 0 0  (Blank rows = not  tested) Graded on 0-4 scale (0 = no pain, 1 = pain, 2 = pain with wincing/grimacing/flinching, 3 = pain with withdrawal, 4 = unwilling to allow palpation)  Passive Accessory Intervertebral Motion Pt reports reproduction of low back pain and L hip pain with CPA L3-L5 , R UPA L3-L5, and L UPA L2-L5. Unable to truly assess mobility secondary to pain;  Special Tests Lumbar Radiculopathy and Discogenic: Centralization and Peripheralization (SN 92, -LR 0.12): Not examined Slump (SN 83, -LR 0.32): R: Positive for low back pain L: Negative SLR (SN 92, -LR 0.29): R: Negative L:  Negative Crossed SLR (SP 90): R: Negative L: Negative  Facet Joint: Extension-Rotation (SN 100, -LR 0.0): R: Negative L: Negative  Lumbar Foraminal Stenosis: Lumbar quadrant (SN 70): R: Positive L: Negative  Hip: FABER (SN 81): R: Positive for low back pain L: Positive for low back pain FADIR (SN 94): R: Negative L: Negative Hip scour (SN 50): R: Negative L: Positive for low back pain  SIJ:  Thigh Thrust (SN 88, -LR 0.18) : R: Not examined  L: Not examined  Piriformis Syndrome: FAIR Test (SN 88, SP 83): R: Not examined L: Not examined  Functional Tasks Deferred  Beighton scale Deferred   TODAY'S TREATMENT:   SUBJECTIVE: Pt reports that she is doing well today. She fell 2 days ago while trying to prevent her uncle from falling when going up stairs, but is feeling better after her PT appointment yesterday. She denies any overt injury but still reports some pain in the low back. Pt is reporting 4/10 low back pain upon arrival today.    PAIN: 4/10 low back pain   Manual Therapy  Prone grade I-II lumbar mobilizations L1-L5, 30s/bout x 3 bouts/level; STM in lumbar region x 8 minutes; Hooklying  single knee to chest 30s hold BLE; Double knee to chest stretch 2 x 30s hold;   TherEx NuStep warmup L1-L4 x 9 mins with moist heat pack on low back; Hooklying lumbar rotation rocking 3s hold x 10 each  direction; Hooklying posterior pelvic tilts 3 x 10 with 3s hold; Hooklying posterior pelvic tilt with marching x 10 BLE; Hooklying bridges with focus on glut contraction initiating with posterior tilt 2 x 10;   Not performed: In prone applied IFC with 2 channels in cross pattern to lower lumbar spine at pt tolerated intensity without muscle contraction (7.8 initially progressed to 9.2 after first 5 min) x 10 minutes with moist heat on low back; L sidelying R lumbar gapping mobilization 3 x 30s; Seated sciatic nerve glides x 10 BLE; Hooklying posterior pelvic tilts with alternating bicycles x 10 BLE (very challenging for patient);   PATIENT EDUCATION:  Education details: Pt educated throughout session about proper posture and technique with exercises. Improved exercise technique, movement at target joints, use of target muscles after min to mod verbal, visual, tactile cues.  Person educated: Patient Education method: Explanation, Tactile cues, and Verbal cues Education comprehension: verbalized understanding, returned demonstration, and verbal cues required   HOME EXERCISE PROGRAM:  Access Code: [email or web address] URL: [email or web address] Date: 02/09/2023 Prepared by: Selinda Eck  Exercises - Supine Posterior Pelvic Tilt  - 1-2 x daily - 7 x weekly - 2 sets - 10 reps - 3-5s hold - Supine Knee Rocks  - 1-2 x daily - 7 x weekly - 2 sets - 10 reps - 3-5s hold - Standing Distal Sciatic Nerve Mobilization on Step  - 1-2 x daily - 7 x weekly - 2 sets - 10 reps - 3-5s hold   ASSESSMENT:  CLINICAL IMPRESSION: Pt demonstrates excellent motivation during session. Continued manual techniques for decompression. Pt noted some discomfort with posterior pelvic tilts with marching but pain remained below 4/10 NPS. Overall pt felt loosened up and noted decreased overall pain since yesterday. No HEP modifications today. Pt encouraged to follow-up as scheduled. Pt will benefit from PT  services to address deficits in strength, ROM, and pain in order to return to full function at home with less back pain.  OBJECTIVE IMPAIRMENTS: decreased activity tolerance, decreased strength, obesity, and pain.   ACTIVITY LIMITATIONS: lifting, bending, sitting, squatting, and caring for others  PARTICIPATION LIMITATIONS: meal prep, cleaning, laundry, and community activity  PERSONAL FACTORS: Past/current experiences, Time since onset of injury/illness/exacerbation, and 3+ comorbidities: OA, migraines, and obesity  are also affecting patient's functional outcome.   REHAB POTENTIAL: Fair    CLINICAL DECISION MAKING: Unstable/unpredictable  EVALUATION COMPLEXITY: High   GOALS: Goals reviewed with patient? No  SHORT TERM GOALS: Target date: 03/04/2023   Pt will be independent with HEP in order to improve strength and decrease back pain to improve pain-free function at home and work. Baseline:  Goal status: INITIAL   LONG TERM GOALS: Target date: 04/01/2023  Pt will increase FOTO to at least 68 to demonstrate significant improvement in function at home and work related to back pain  Baseline: 56 Goal status: INITIAL  2.  Pt will decrease worst back pain by at least 2 points on the NPRS in order to demonstrate clinically significant reduction in back pain. Baseline: 5/10 Goal status: INITIAL  3.  Pt will decrease mODI score by at least 13 points in order demonstrate clinically significant reduction in back pain/disability.       Baseline: To be completed  Goal status: INITIAL  4. Pt will increase strength of R ankle dorsiflexion to at least 4+/5 MMT grade in order to demonstrate improvement in strength and function  Baseline: 3+/5 Goal status: INITIAL   PLAN: PT FREQUENCY: 1-2x/week  PT DURATION: 8 weeks  PLANNED INTERVENTIONS: Therapeutic exercises, Therapeutic activity, Neuromuscular re-education, Balance training, Gait training, Patient/Family education, Self Care,  Joint mobilization, Joint manipulation, Vestibular training, Canalith repositioning, Orthotic/Fit training, DME instructions, Dry Needling, Electrical stimulation, Spinal manipulation, Spinal mobilization, Cryotherapy, Moist heat, Taping, Traction, Ultrasound, Ionotophoresis 4mg /ml Dexamethasone, Manual therapy, and Re-evaluation.  PLAN FOR NEXT SESSION: complete MODI, progress stabilization exercises, assess response to manual techniques and progress as appropriate.   Kyrese Gartman, SPT [employer] [facility]   Jason D Huprich PT, DPT, GCS  Huprich,Jason, PT 03/11/2023, 10:58 AM

## 2023-03-16 ENCOUNTER — Ambulatory Visit: Payer: 59

## 2023-03-16 DIAGNOSIS — M5459 Other low back pain: Secondary | ICD-10-CM | POA: Diagnosis not present

## 2023-03-16 DIAGNOSIS — M6281 Muscle weakness (generalized): Secondary | ICD-10-CM

## 2023-03-16 NOTE — Therapy (Signed)
OUTPATIENT PHYSICAL THERAPY THORACOLUMBAR TREATMENT/PROGRESS NOTE  Dates of reporting period  02/04/23   to   03/16/2023   Patient Name: Brittany Pham MRN: [id number] DOB:23-Dec-1965, 58 y.o., female Today's Date: 03/17/2023  END OF SESSION:  PT End of Session - 03/16/23 0834     Visit Number 10    Number of Visits 17    Date for PT Re-Evaluation 04/01/23    Authorization Type eval: 02/04/23    PT Start Time 0845    PT Stop Time 0930    PT Time Calculation (min) 45 min    Activity Tolerance Patient tolerated treatment well    Behavior During Therapy [facility] for tasks assessed/performed            Past Medical History:  Diagnosis Date   Arthritis    Chickenpox    Complication of anesthesia    nausea after septoplasty   Depression    GERD (gastroesophageal reflux disease)    Migraines    2-3 per month   PONV (postoperative nausea and vomiting)    Post-viral cough syndrome 05/16/2018   Past Surgical History:  Procedure Laterality Date   CERVICAL ABLATION  2007   CERVICAL SPINE SURGERY  2014   COLONOSCOPY WITH PROPOFOL N/A 10/19/2018   Procedure: COLONOSCOPY WITH BIOPSY;  Surgeon: Midge Minium, MD;  Location: [facility];  Service: Endoscopy;  Laterality: N/A;   POLYPECTOMY N/A 10/19/2018   Procedure: POLYPECTOMY;  Surgeon: Midge Minium, MD;  Location: [facility];  Service: Endoscopy;  Laterality: N/A;   SEPTOPLASTY  1987   Patient Active Problem List   Diagnosis Date Noted   Chronic left-sided back pain 01/10/2023   Acute right ankle pain 01/10/2023   Tobacco dependence 05/20/2022   OSA (obstructive sleep apnea) 05/01/2019   External hemorrhoid 05/01/2019   Encounter for screening colonoscopy    Polyp of ascending colon    Rectal polyp    Myopia of both eyes 05/09/2018   Presbyopia 05/09/2018   Genital herpes 09/19/2017   Frequent headaches 09/19/2017   Stress incontinence of urine 09/19/2017   Prediabetes 09/19/2017   Menopausal symptoms  09/19/2017   Migraines 09/22/2016   Chronic neck pain 09/22/2016   Anxiety and depression 09/22/2016   Preventative health care 09/22/2016    PCP: Doreene Nest, NP  REFERRING PROVIDER: Doreene Nest, NP  REFERRING DIAG: M54.50,G89.29 (ICD-10-CM) - Chronic left-sided low back pain without sciatica  RATIONALE FOR EVALUATION AND TREATMENT: Rehabilitation  THERAPY DIAG: Other low back pain  Muscle weakness (generalized)  ONSET DATE: Late October  FOLLOW-UP APPT SCHEDULED WITH REFERRING PROVIDER: Yes   FROM INITIAL EVALUATION SUBJECTIVE:  SUBJECTIVE STATEMENT:  Low back pain and R foot weakness  PERTINENT HISTORY:  Pt referred for acute on chronic low back pain. She suffered a fall approximately 6 weeks ago with worsening of left low back pain and sudden R foot dorsiflexion weakness and numbness.  During the fall pt had a R ankle inversion sprain with subsequent swelling which has mostly resolved. She now complains of numbness in entire R foot up to the ankle. Symptoms have remained unchanged over the last few weeks. Pt has a history of a low back fracture playing softball in seventh grade with chronic low back pain since that time. However this is a significant acute worsening of her pain. She is currently retired and has been trying to lose weight recently. Pt has a history of chronic L hip pain as well and until about 6 months ago was seeing a [facility]. She also has a history of a cervical fusion in 2014. She denies any saddle paresthesia or loss of bowel/bladder control.  01/10/23: DG Ankle Complete Right: IMPRESSION: Mild to moderate lateral ankle soft tissue swelling versus patient body habitus. No acute fracture is seen.  01/10/23: EXAM: LUMBAR SPINE - 2-3 VIEW IMPRESSION: 1. Grade 1  anterolisthesis of L5 on S1 appears new from remote 10/10/2007 CT. 2. Chronic bilateral L5 pars defects, also seen on prior remote 2009 CT. 3. Mild-to-moderate L5-S1 degenerative disc and endplate changes.  PAIN:    Pain Intensity: Present: 0/10, Best: 0/10, Worst: 5/10 Pain location: left low back  Pain Quality: "excruciating" Radiating: Yes, occasionally down RLE Numbness/Tingling: Yes, entire R foot to the ankle Focal Weakness: Yes, R ankle Aggravating factors: sit to/from stand  Relieving factors: heat, TENS unit Position of comfort: Standing 24-hour pain behavior: Improves throughout day History of prior back injury, pain, surgery, or therapy: Yes, spinal fracture in adolescence Dominant hand: left Imaging: Yes, plain films (see history); Red flags: Negative for bowel/bladder changes, saddle paresthesia, personal history of cancer, h/o spinal tumors, h/o compression fx, h/o abdominal aneurysm, abdominal pain, chills/fever, night sweats, nausea, vomiting, unrelenting pain;  PRECAUTIONS: None  WEIGHT BEARING RESTRICTIONS: No  FALLS: Has patient fallen in last 6 months? Yes. Number of falls 2  Living Environment Lives with: lives with their family, spouse and 34 year old twins Lives in: House/apartment, single level Stairs: ramp to enter Has following equipment at home: None  Prior level of function: Independent  Occupational demands: Retired Journalist, newspaper: caring for grandchildren, helping care for her extended family  Patient Goals: "I want to be more active."    OBJECTIVE:  Patient Surveys  FOTO 37, predicted improvement to 67  Cognition Patient is oriented to person, place, and time.  Recent memory is intact.  Remote memory is intact.  Attention span and concentration are intact.  Expressive speech is intact.  Patient's fund of knowledge is within normal limits for educational level.    Gross Musculoskeletal Assessment Tremor: None Bulk: Normal Tone:  Normal No visible step-off along spinal column, no signs of scoliosis Mild swelling noted in R ankle just superior to lateral malleoli.  GAIT: Deferred full gait assessment  Posture: Lumbar lordosis: WNL Iliac crest height: Equal bilaterally Lumbar lateral shift: Negative  AROM AROM (Normal range in degrees) AROM   Lumbar   Flexion (65) Moderately limited*  Extension (30) Severely limited*  Right lateral flexion (25) Min limitation*  Left lateral flexion (25) Severe limitation*  Right rotation (30) Min limitation  Left rotation (30) Mod limitation*  Hip Right Left  Flexion (125) WNL* WNL*  Extension (15)    Abduction (40)    Adduction     Internal Rotation (45) 45 50  External Rotation (45) 23 22      Knee    Flexion (135) WNL WNL  Extension (0) WNL WNL  (* = pain; Blank rows = not tested)  LE MMT: MMT (out of 5) Right  Left   Hip flexion 4+* (left low back pain) 5  Hip extension    Hip abduction 5 5  Hip adduction 5 5  Hip internal rotation 5 5  Hip external rotation 5 5  Knee flexion 5 5  Knee extension 5 5  Ankle dorsiflexion 3+ 5  Ankle plantarflexion Strong Strong  Ankle inversion 4 5  Ankle eversion 4 5  (* = pain; Blank rows = not tested)  Sensation Pt reports decreased sensation along L5 and S1 dermatomes of R ankle (anterior and lateral ankle). Otherwise RLE sensation intact. Proprioception, stereognosis, and hot/cold testing deferred on this date.  Reflexes R/L Knee Jerk (L3/4): 2+/2+  Ankle Jerk (S1/2): 1+/0   Muscle Length Hamstrings: R: Positive for tightness around 75 degrees L: Positive for tightness around 75 degrees [place] (quadriceps): R: Not examined L: Not examined Thomas (hip flexors): R: Not examined L: Not examined Ober: R: Not examined L: Not examined  Palpation Location Right Left         Lumbar paraspinals 1 1  Quadratus Lumborum 1 1  Gluteus Maximus 0 0  Gluteus Medius 0 0  Deep hip external rotators 0 0  PSIS 0 0   Fortin's Area (SIJ) 0 0  Greater Trochanter 0 0  (Blank rows = not tested) Graded on 0-4 scale (0 = no pain, 1 = pain, 2 = pain with wincing/grimacing/flinching, 3 = pain with withdrawal, 4 = unwilling to allow palpation)  Passive Accessory Intervertebral Motion Pt reports reproduction of low back pain and L hip pain with CPA L3-L5 , R UPA L3-L5, and L UPA L2-L5. Unable to truly assess mobility secondary to pain;  Special Tests Lumbar Radiculopathy and Discogenic: Centralization and Peripheralization (SN 92, -LR 0.12): Not examined Slump (SN 83, -LR 0.32): R: Positive for low back pain L: Negative SLR (SN 92, -LR 0.29): R: Negative L:  Negative Crossed SLR (SP 90): R: Negative L: Negative  Facet Joint: Extension-Rotation (SN 100, -LR 0.0): R: Negative L: Negative  Lumbar Foraminal Stenosis: Lumbar quadrant (SN 70): R: Positive L: Negative  Hip: FABER (SN 81): R: Positive for low back pain L: Positive for low back pain FADIR (SN 94): R: Negative L: Negative Hip scour (SN 50): R: Negative L: Positive for low back pain  SIJ:  Thigh Thrust (SN 88, -LR 0.18) : R: Not examined  L: Not examined  Piriformis Syndrome: FAIR Test (SN 88, SP 83): R: Not examined L: Not examined  Functional Tasks Deferred  Beighton scale Deferred   TODAY'S TREATMENT:   SUBJECTIVE: Pt reports that she is doing well today. Pt expresses pain decreased after PT session 03/11/23 and felt better after the session. Pt reports pain has gotten up to 10/10 NPS since last session, and decreased as low as 5/10 NPS after rest and use of heating pad. Pt is reporting 7/10 low back pain upon arrival today. No questions or concerns upon arrival.   PAIN: 7/10 low back pain at beginning of session; 4/10 end of session   Manual Therapy  Prone grade I-II lumbar mobilizations  L1-L5, 30s/bout x 3 bouts/level; STM in lumbar region x 8 minutes; Hooklying single knee to chest 3 x 30s hold BLE; Double knee to chest  stretch 3 x 30s hold; Hooklying lumbar rotation rocking 3s hold x 10 each direction;   TherEx NuStep warmup L1-L4 x 10 mins with moist heat pack on low back; Hooklying posterior pelvic tilts 3 x 10 with 3s hold; Hooklying bridges with focus on glute contraction initiating with posterior tilt 2 x 10;   Not performed: L sidelying R lumbar gapping mobilization 3 x 30s; Seated sciatic nerve glides x 10 BLE; Hooklying posterior pelvic tilts with alternating bicycles x 10 BLE (very challenging for patient); Hooklying posterior pelvic tilt with marching x 10 BLE (very challenging for pt today);   PATIENT EDUCATION:  Education details: Pt educated throughout session about proper posture and technique with exercises. Improved exercise technique, movement at target joints, use of target muscles after min to mod verbal, visual, tactile cues.  Person educated: Patient Education method: Explanation, Tactile cues, and Verbal cues Education comprehension: verbalized understanding, returned demonstration, and verbal cues required   HOME EXERCISE PROGRAM:  Access Code: [email or web address] URL: [email or web address] Date: 02/09/2023 Prepared by: Ria Comment  Exercises - Supine Posterior Pelvic Tilt  - 1-2 x daily - 7 x weekly - 2 sets - 10 reps - 3-5s hold - Supine Knee Rocks  - 1-2 x daily - 7 x weekly - 2 sets - 10 reps - 3-5s hold - Standing Distal Sciatic Nerve Mobilization on Step  - 1-2 x daily - 7 x weekly - 2 sets - 10 reps - 3-5s hold   ASSESSMENT:  CLINICAL IMPRESSION: Pt continues to have some discomfort with ADLs, especially getting in and out of bed. Continued manual techniques for decompression. Pt noted some discomfort with posterior pelvic tilts with marching so we discontinued for today's session. Pt began session at 7/10 NPS but decreased to 4/10 NPS at end of session. Overall pt felt "loosened up" and noted decreased overall pain since yesterday. PT responded well to  manual therapy today. No HEP modifications today. Pt encouraged to follow-up as scheduled. Pt will benefit from PT services to address deficits in strength, ROM, and pain in order to return to full function at home with less back pain.  OBJECTIVE IMPAIRMENTS: decreased activity tolerance, decreased strength, obesity, and pain.   ACTIVITY LIMITATIONS: lifting, bending, sitting, squatting, and caring for others  PARTICIPATION LIMITATIONS: meal prep, cleaning, laundry, and community activity  PERSONAL FACTORS: Past/current experiences, Time since onset of injury/illness/exacerbation, and 3+ comorbidities: OA, migraines, and obesity  are also affecting patient's functional outcome.   REHAB POTENTIAL: Fair    CLINICAL DECISION MAKING: Unstable/unpredictable  EVALUATION COMPLEXITY: High   GOALS: Goals reviewed with patient? No  SHORT TERM GOALS: Target date: 03/04/2023   Pt will be independent with HEP in order to improve strength and decrease back pain to improve pain-free function at home and work. Baseline:  Goal status: INITIAL   LONG TERM GOALS: Target date: 04/01/2023  Pt will increase FOTO to at least 68 to demonstrate significant improvement in function at home and work related to back pain  Baseline: 56; 03/16/23: 40 Goal status: Ongoing  2.  Pt will decrease worst back pain by at least 2 points on the NPRS in order to demonstrate clinically significant reduction in back pain. Baseline: 5/10; 03/26/23: 10/10 worst, 5/5 best Goal status: Ongoing  3.  Pt will decrease mODI score by at least 13 points  in order demonstrate clinically significant reduction in back pain/disability.       Baseline: 03/16/23: 50% Goal status: INITIAL  4. Pt will increase strength of R ankle dorsiflexion to at least 4+/5 MMT grade in order to demonstrate improvement in strength and function  Baseline: 3+/5 Goal status: Deferred    PLAN: PT FREQUENCY: 1-2x/week  PT DURATION: 8 weeks  PLANNED  INTERVENTIONS: Therapeutic exercises, Therapeutic activity, Neuromuscular re-education, Balance training, Gait training, Patient/Family education, Self Care, Joint mobilization, Joint manipulation, Vestibular training, Canalith repositioning, Orthotic/Fit training, DME instructions, Dry Needling, Electrical stimulation, Spinal manipulation, Spinal mobilization, Cryotherapy, Moist heat, Taping, Traction, Ultrasound, Ionotophoresis 4mg /ml Dexamethasone, Manual therapy, and Re-evaluation.  PLAN FOR NEXT SESSION: strength test MMT, progress stabilization exercises, assess response to manual techniques and progress as appropriate.  This entire session was performed under direct supervision and direction of a licensed therapist/therapist assistant . I have personally read, edited and approve of the note as written.   Sherri Sear, SPT [facility]   Jason D Huprich PT, DPT, GCS  Huprich,Jason, PT 03/17/2023, 9:12 AM

## 2023-03-23 ENCOUNTER — Ambulatory Visit: Payer: 59

## 2023-03-23 DIAGNOSIS — M6281 Muscle weakness (generalized): Secondary | ICD-10-CM

## 2023-03-23 DIAGNOSIS — M5459 Other low back pain: Secondary | ICD-10-CM | POA: Diagnosis not present

## 2023-03-23 NOTE — Therapy (Unsigned)
OUTPATIENT PHYSICAL THERAPY THORACOLUMBAR TREATMENT/PROGRESS NOTE  Dates of reporting period  02/04/23   to   03/16/2023   Patient Name: Brittany Pham MRN: [id number] DOB:November 07, 1965, 58 y.o., female Today's Date: 03/23/2023  END OF SESSION:  PT End of Session - 03/23/23 0842     Visit Number 11    Number of Visits 17    Date for PT Re-Evaluation 04/01/23    Authorization Type eval: 02/04/23    PT Start Time 0845    PT Stop Time 0930    PT Time Calculation (min) 45 min    Activity Tolerance Patient tolerated treatment well    Behavior During Therapy [facility] for tasks assessed/performed            Past Medical History:  Diagnosis Date   Arthritis    Chickenpox    Complication of anesthesia    nausea after septoplasty   Depression    GERD (gastroesophageal reflux disease)    Migraines    2-3 per month   PONV (postoperative nausea and vomiting)    Post-viral cough syndrome 05/16/2018   Past Surgical History:  Procedure Laterality Date   CERVICAL ABLATION  2007   CERVICAL SPINE SURGERY  2014   COLONOSCOPY WITH PROPOFOL N/A 10/19/2018   Procedure: COLONOSCOPY WITH BIOPSY;  Surgeon: Midge Minium, MD;  Location: [facility];  Service: Endoscopy;  Laterality: N/A;   POLYPECTOMY N/A 10/19/2018   Procedure: POLYPECTOMY;  Surgeon: Midge Minium, MD;  Location: [facility];  Service: Endoscopy;  Laterality: N/A;   SEPTOPLASTY  1987   Patient Active Problem List   Diagnosis Date Noted   Chronic left-sided back pain 01/10/2023   Acute right ankle pain 01/10/2023   Tobacco dependence 05/20/2022   OSA (obstructive sleep apnea) 05/01/2019   External hemorrhoid 05/01/2019   Encounter for screening colonoscopy    Polyp of ascending colon    Rectal polyp    Myopia of both eyes 05/09/2018   Presbyopia 05/09/2018   Genital herpes 09/19/2017   Frequent headaches 09/19/2017   Stress incontinence of urine 09/19/2017   Prediabetes 09/19/2017   Menopausal symptoms  09/19/2017   Migraines 09/22/2016   Chronic neck pain 09/22/2016   Anxiety and depression 09/22/2016   Preventative health care 09/22/2016    PCP: Doreene Nest, NP  REFERRING PROVIDER: Doreene Nest, NP  REFERRING DIAG: M54.50,G89.29 (ICD-10-CM) - Chronic left-sided low back pain without sciatica  RATIONALE FOR EVALUATION AND TREATMENT: Rehabilitation  THERAPY DIAG: Other low back pain  Muscle weakness (generalized)  ONSET DATE: Late October  FOLLOW-UP APPT SCHEDULED WITH REFERRING PROVIDER: Yes   FROM INITIAL EVALUATION SUBJECTIVE:  SUBJECTIVE STATEMENT:  Low back pain and R foot weakness  PERTINENT HISTORY:  Pt referred for acute on chronic low back pain. She suffered a fall approximately 6 weeks ago with worsening of left low back pain and sudden R foot dorsiflexion weakness and numbness.  During the fall pt had a R ankle inversion sprain with subsequent swelling which has mostly resolved. She now complains of numbness in entire R foot up to the ankle. Symptoms have remained unchanged over the last few weeks. Pt has a history of a low back fracture playing softball in seventh grade with chronic low back pain since that time. However this is a significant acute worsening of her pain. She is currently retired and has been trying to lose weight recently. Pt has a history of chronic L hip pain as well and until about 6 months ago was seeing a [facility]. She also has a history of a cervical fusion in 2014. She denies any saddle paresthesia or loss of bowel/bladder control.  01/10/23: DG Ankle Complete Right: IMPRESSION: Mild to moderate lateral ankle soft tissue swelling versus patient body habitus. No acute fracture is seen.  01/10/23: EXAM: LUMBAR SPINE - 2-3 VIEW IMPRESSION: 1. Grade 1  anterolisthesis of L5 on S1 appears new from remote 10/10/2007 CT. 2. Chronic bilateral L5 pars defects, also seen on prior remote 2009 CT. 3. Mild-to-moderate L5-S1 degenerative disc and endplate changes.  PAIN:    Pain Intensity: Present: 0/10, Best: 0/10, Worst: 5/10 Pain location: left low back  Pain Quality: "excruciating" Radiating: Yes, occasionally down RLE Numbness/Tingling: Yes, entire R foot to the ankle Focal Weakness: Yes, R ankle Aggravating factors: sit to/from stand  Relieving factors: heat, TENS unit Position of comfort: Standing 24-hour pain behavior: Improves throughout day History of prior back injury, pain, surgery, or therapy: Yes, spinal fracture in adolescence Dominant hand: left Imaging: Yes, plain films (see history); Red flags: Negative for bowel/bladder changes, saddle paresthesia, personal history of cancer, h/o spinal tumors, h/o compression fx, h/o abdominal aneurysm, abdominal pain, chills/fever, night sweats, nausea, vomiting, unrelenting pain;  PRECAUTIONS: None  WEIGHT BEARING RESTRICTIONS: No  FALLS: Has patient fallen in last 6 months? Yes. Number of falls 2  Living Environment Lives with: lives with their family, spouse and 26 year old twins Lives in: House/apartment, single level Stairs: ramp to enter Has following equipment at home: None  Prior level of function: Independent  Occupational demands: Retired Journalist, newspaper: caring for grandchildren, helping care for her extended family  Patient Goals: "I want to be more active."    OBJECTIVE:  Patient Surveys  FOTO 24, predicted improvement to 71  Cognition Patient is oriented to person, place, and time.  Recent memory is intact.  Remote memory is intact.  Attention span and concentration are intact.  Expressive speech is intact.  Patient's fund of knowledge is within normal limits for educational level.    Gross Musculoskeletal Assessment Tremor: None Bulk: Normal Tone:  Normal No visible step-off along spinal column, no signs of scoliosis Mild swelling noted in R ankle just superior to lateral malleoli.  GAIT: Deferred full gait assessment  Posture: Lumbar lordosis: WNL Iliac crest height: Equal bilaterally Lumbar lateral shift: Negative  AROM AROM (Normal range in degrees) AROM   Lumbar   Flexion (65) Moderately limited*  Extension (30) Severely limited*  Right lateral flexion (25) Min limitation*  Left lateral flexion (25) Severe limitation*  Right rotation (30) Min limitation  Left rotation (30) Mod limitation*  Hip Right Left  Flexion (125) WNL* WNL*  Extension (15)    Abduction (40)    Adduction     Internal Rotation (45) 45 50  External Rotation (45) 23 22      Knee    Flexion (135) WNL WNL  Extension (0) WNL WNL  (* = pain; Blank rows = not tested)  LE MMT: MMT (out of 5) Right  Left   Hip flexion 4+* (left low back pain) 5  Hip extension    Hip abduction 5 5  Hip adduction 5 5  Hip internal rotation 5 5  Hip external rotation 5 5  Knee flexion 5 5  Knee extension 5 5  Ankle dorsiflexion 3+ 5  Ankle plantarflexion Strong Strong  Ankle inversion 4 5  Ankle eversion 4 5  (* = pain; Blank rows = not tested)  Sensation Pt reports decreased sensation along L5 and S1 dermatomes of R ankle (anterior and lateral ankle). Otherwise RLE sensation intact. Proprioception, stereognosis, and hot/cold testing deferred on this date.  Reflexes R/L Knee Jerk (L3/4): 2+/2+  Ankle Jerk (S1/2): 1+/0   Muscle Length Hamstrings: R: Positive for tightness around 75 degrees L: Positive for tightness around 75 degrees [place] (quadriceps): R: Not examined L: Not examined Thomas (hip flexors): R: Not examined L: Not examined Ober: R: Not examined L: Not examined  Palpation Location Right Left         Lumbar paraspinals 1 1  Quadratus Lumborum 1 1  Gluteus Maximus 0 0  Gluteus Medius 0 0  Deep hip external rotators 0 0  PSIS 0 0   Fortin's Area (SIJ) 0 0  Greater Trochanter 0 0  (Blank rows = not tested) Graded on 0-4 scale (0 = no pain, 1 = pain, 2 = pain with wincing/grimacing/flinching, 3 = pain with withdrawal, 4 = unwilling to allow palpation)  Passive Accessory Intervertebral Motion Pt reports reproduction of low back pain and L hip pain with CPA L3-L5 , R UPA L3-L5, and L UPA L2-L5. Unable to truly assess mobility secondary to pain;  Special Tests Lumbar Radiculopathy and Discogenic: Centralization and Peripheralization (SN 92, -LR 0.12): Not examined Slump (SN 83, -LR 0.32): R: Positive for low back pain L: Negative SLR (SN 92, -LR 0.29): R: Negative L:  Negative Crossed SLR (SP 90): R: Negative L: Negative  Facet Joint: Extension-Rotation (SN 100, -LR 0.0): R: Negative L: Negative  Lumbar Foraminal Stenosis: Lumbar quadrant (SN 70): R: Positive L: Negative  Hip: FABER (SN 81): R: Positive for low back pain L: Positive for low back pain FADIR (SN 94): R: Negative L: Negative Hip scour (SN 50): R: Negative L: Positive for low back pain  SIJ:  Thigh Thrust (SN 88, -LR 0.18) : R: Not examined  L: Not examined  Piriformis Syndrome: FAIR Test (SN 88, SP 83): R: Not examined L: Not examined  Functional Tasks Deferred  Beighton scale Deferred   TODAY'S TREATMENT:   SUBJECTIVE: Pt reports that she is doing well today. Pt reports pain has gotten up to 7/10 NPS since last session, and decreased as low as 5/10 NPS after rest and use of heating pad. Pt is reporting 5/10 low back pain upon arrival today. She has an appt with the neurologist 03/23/23. No questions or concerns upon arrival.   PAIN: 5/10 low back pain at beginning of session; 5/10 end of session   Manual Therapy  Prone grade I-II lumbar mobilizations L1-L5, 30s/bout x 3 bouts/level; STM  in lumbar region x 8 minutes; Hooklying single knee to chest 3 x 30s hold BLE; Double knee to chest stretch 3 x 30s hold; Hooklying lumbar  rotation rocking 3s hold x 10 each direction;   TherEx NuStep warmup L1-L4 x 10 mins with moist heat pack on low back; Hooklying posterior pelvic tilts 3 x 10 with 3s hold; Hooklying bridges with focus on glute contraction initiating with posterior tilt 2 x 10;   Not performed: L sidelying R lumbar gapping mobilization 3 x 30s; Seated sciatic nerve glides x 10 BLE; Hooklying posterior pelvic tilts with alternating bicycles x 10 BLE (very challenging for patient); Hooklying posterior pelvic tilt with marching x 10 BLE (very challenging for pt today);   PATIENT EDUCATION:  Education details: Pt educated throughout session about proper posture and technique with exercises. Improved exercise technique, movement at target joints, use of target muscles after min to mod verbal, visual, tactile cues.  Person educated: Patient Education method: Explanation, Tactile cues, and Verbal cues Education comprehension: verbalized understanding, returned demonstration, and verbal cues required   HOME EXERCISE PROGRAM:  Access Code: [email or web address] URL: [email or web address] Date: 02/09/2023 Prepared by: Ria Comment  Exercises - Supine Posterior Pelvic Tilt  - 1-2 x daily - 7 x weekly - 2 sets - 10 reps - 3-5s hold - Supine Knee Rocks  - 1-2 x daily - 7 x weekly - 2 sets - 10 reps - 3-5s hold - Standing Distal Sciatic Nerve Mobilization on Step  - 1-2 x daily - 7 x weekly - 2 sets - 10 reps - 3-5s hold   ASSESSMENT:  CLINICAL IMPRESSION: Pt continues to have some discomfort with ADLs, especially getting in and out of bed. Continued manual techniques for decompression. Overall pt felt "loosened up" and noted decreased overall pain since yesterday. PT responded well to manual therapy today. No HEP modifications today. Pt encouraged to follow-up as scheduled. Pt will benefit from PT services to address deficits in strength, ROM, and pain in order to return to full function at home with less  back pain.  OBJECTIVE IMPAIRMENTS: decreased activity tolerance, decreased strength, obesity, and pain.   ACTIVITY LIMITATIONS: lifting, bending, sitting, squatting, and caring for others  PARTICIPATION LIMITATIONS: meal prep, cleaning, laundry, and community activity  PERSONAL FACTORS: Past/current experiences, Time since onset of injury/illness/exacerbation, and 3+ comorbidities: OA, migraines, and obesity  are also affecting patient's functional outcome.   REHAB POTENTIAL: Fair    CLINICAL DECISION MAKING: Unstable/unpredictable  EVALUATION COMPLEXITY: High   GOALS: Goals reviewed with patient? No  SHORT TERM GOALS: Target date: 03/04/2023   Pt will be independent with HEP in order to improve strength and decrease back pain to improve pain-free function at home and work. Baseline:  Goal status: INITIAL   LONG TERM GOALS: Target date: 04/01/2023  Pt will increase FOTO to at least 68 to demonstrate significant improvement in function at home and work related to back pain  Baseline: 56; 03/16/23: 40 Goal status: Ongoing  2.  Pt will decrease worst back pain by at least 2 points on the NPRS in order to demonstrate clinically significant reduction in back pain. Baseline: 5/10; 03/26/23: 10/10 worst, 5/5 best Goal status: Ongoing  3.  Pt will decrease mODI score by at least 13 points in order demonstrate clinically significant reduction in back pain/disability.       Baseline: 03/16/23: 50% Goal status: INITIAL  4. Pt will increase strength of R ankle dorsiflexion to at least 4+/5 MMT grade  in order to demonstrate improvement in strength and function  Baseline: 3+/5 Goal status: Deferred    PLAN: PT FREQUENCY: 1-2x/week  PT DURATION: 8 weeks  PLANNED INTERVENTIONS: Therapeutic exercises, Therapeutic activity, Neuromuscular re-education, Balance training, Gait training, Patient/Family education, Self Care, Joint mobilization, Joint manipulation, Vestibular training, Canalith  repositioning, Orthotic/Fit training, DME instructions, Dry Needling, Electrical stimulation, Spinal manipulation, Spinal mobilization, Cryotherapy, Moist heat, Taping, Traction, Ultrasound, Ionotophoresis 4mg /ml Dexamethasone, Manual therapy, and Re-evaluation.  PLAN FOR NEXT SESSION: strength test MMT, progress stabilization exercises, assess response to manual techniques and progress as appropriate.  This entire session was performed under direct supervision and direction of a licensed therapist/therapist assistant . I have personally read, edited and approve of the note as written.   Sherri Sear, SPT [facility]   Lynnea Maizes PT, DPT, GCS  Heavenleigh Petruzzi, Student-PT 03/23/2023, 9:35 AM

## 2023-03-25 ENCOUNTER — Other Ambulatory Visit: Payer: Self-pay | Admitting: Primary Care

## 2023-03-25 ENCOUNTER — Ambulatory Visit: Payer: 59

## 2023-03-25 DIAGNOSIS — G43909 Migraine, unspecified, not intractable, without status migrainosus: Secondary | ICD-10-CM

## 2023-03-25 DIAGNOSIS — M6281 Muscle weakness (generalized): Secondary | ICD-10-CM

## 2023-03-25 DIAGNOSIS — M5459 Other low back pain: Secondary | ICD-10-CM | POA: Diagnosis not present

## 2023-03-25 NOTE — Therapy (Signed)
OUTPATIENT PHYSICAL THERAPY THORACOLUMBAR TREATMENT  Patient Name: Brittany Pham MRN: [id number] DOB:08/01/1965, 58 y.o., female Today's Date: 03/25/2023  END OF SESSION:  PT End of Session - 03/25/23 0847     Visit Number 12    Number of Visits 17    Date for PT Re-Evaluation 04/01/23    Authorization Type eval: 02/04/23    PT Start Time 0845    PT Stop Time 0930    PT Time Calculation (min) 45 min    Activity Tolerance Patient tolerated treatment well    Behavior During Therapy [facility] for tasks assessed/performed            Past Medical History:  Diagnosis Date   Arthritis    Chickenpox    Complication of anesthesia    nausea after septoplasty   Depression    GERD (gastroesophageal reflux disease)    Migraines    2-3 per month   PONV (postoperative nausea and vomiting)    Post-viral cough syndrome 05/16/2018   Past Surgical History:  Procedure Laterality Date   CERVICAL ABLATION  2007   CERVICAL SPINE SURGERY  2014   COLONOSCOPY WITH PROPOFOL N/A 10/19/2018   Procedure: COLONOSCOPY WITH BIOPSY;  Surgeon: Midge Minium, MD;  Location: [facility];  Service: Endoscopy;  Laterality: N/A;   POLYPECTOMY N/A 10/19/2018   Procedure: POLYPECTOMY;  Surgeon: Midge Minium, MD;  Location: [facility];  Service: Endoscopy;  Laterality: N/A;   SEPTOPLASTY  1987   Patient Active Problem List   Diagnosis Date Noted   Chronic left-sided back pain 01/10/2023   Acute right ankle pain 01/10/2023   Tobacco dependence 05/20/2022   OSA (obstructive sleep apnea) 05/01/2019   External hemorrhoid 05/01/2019   Encounter for screening colonoscopy    Polyp of ascending colon    Rectal polyp    Myopia of both eyes 05/09/2018   Presbyopia 05/09/2018   Genital herpes 09/19/2017   Frequent headaches 09/19/2017   Stress incontinence of urine 09/19/2017   Prediabetes 09/19/2017   Menopausal symptoms 09/19/2017   Migraines 09/22/2016   Chronic neck pain 09/22/2016   Anxiety  and depression 09/22/2016   Preventative health care 09/22/2016    PCP: Doreene Nest, NP  REFERRING PROVIDER: Doreene Nest, NP  REFERRING DIAG: M54.50,G89.29 (ICD-10-CM) - Chronic left-sided low back pain without sciatica  RATIONALE FOR EVALUATION AND TREATMENT: Rehabilitation  THERAPY DIAG: Muscle weakness (generalized)  Other low back pain  ONSET DATE: Late October  FOLLOW-UP APPT SCHEDULED WITH REFERRING PROVIDER: Yes   FROM INITIAL EVALUATION SUBJECTIVE:  SUBJECTIVE STATEMENT:  Low back pain and R foot weakness  PERTINENT HISTORY:  Pt referred for acute on chronic low back pain. She suffered a fall approximately 6 weeks ago with worsening of left low back pain and sudden R foot dorsiflexion weakness and numbness.  During the fall pt had a R ankle inversion sprain with subsequent swelling which has mostly resolved. She now complains of numbness in entire R foot up to the ankle. Symptoms have remained unchanged over the last few weeks. Pt has a history of a low back fracture playing softball in seventh grade with chronic low back pain since that time. However this is a significant acute worsening of her pain. She is currently retired and has been trying to lose weight recently. Pt has a history of chronic L hip pain as well and until about 6 months ago was seeing a [facility]. She also has a history of a cervical fusion in 2014. She denies any saddle paresthesia or loss of bowel/bladder control.  01/10/23: DG Ankle Complete Right: IMPRESSION: Mild to moderate lateral ankle soft tissue swelling versus patient body habitus. No acute fracture is seen.  01/10/23: EXAM: LUMBAR SPINE - 2-3 VIEW IMPRESSION: 1. Grade 1 anterolisthesis of L5 on S1 appears new from remote 10/10/2007 CT. 2. Chronic  bilateral L5 pars defects, also seen on prior remote 2009 CT. 3. Mild-to-moderate L5-S1 degenerative disc and endplate changes.  PAIN:    Pain Intensity: Present: 0/10, Best: 0/10, Worst: 5/10 Pain location: left low back  Pain Quality: "excruciating" Radiating: Yes, occasionally down RLE Numbness/Tingling: Yes, entire R foot to the ankle Focal Weakness: Yes, R ankle Aggravating factors: sit to/from stand  Relieving factors: heat, TENS unit Position of comfort: Standing 24-hour pain behavior: Improves throughout day History of prior back injury, pain, surgery, or therapy: Yes, spinal fracture in adolescence Dominant hand: left Imaging: Yes, plain films (see history); Red flags: Negative for bowel/bladder changes, saddle paresthesia, personal history of cancer, h/o spinal tumors, h/o compression fx, h/o abdominal aneurysm, abdominal pain, chills/fever, night sweats, nausea, vomiting, unrelenting pain;  PRECAUTIONS: None  WEIGHT BEARING RESTRICTIONS: No  FALLS: Has patient fallen in last 6 months? Yes. Number of falls 2  Living Environment Lives with: lives with their family, spouse and 71 year old twins Lives in: House/apartment, single level Stairs: ramp to enter Has following equipment at home: None  Prior level of function: Independent  Occupational demands: Retired Journalist, newspaper: caring for grandchildren, helping care for her extended family  Patient Goals: "I want to be more active."    OBJECTIVE:  Patient Surveys  FOTO 28, predicted improvement to 56  Cognition Patient is oriented to person, place, and time.  Recent memory is intact.  Remote memory is intact.  Attention span and concentration are intact.  Expressive speech is intact.  Patient's fund of knowledge is within normal limits for educational level.    Gross Musculoskeletal Assessment Tremor: None Bulk: Normal Tone: Normal No visible step-off along spinal column, no signs of scoliosis Mild  swelling noted in R ankle just superior to lateral malleoli.  GAIT: Deferred full gait assessment  Posture: Lumbar lordosis: WNL Iliac crest height: Equal bilaterally Lumbar lateral shift: Negative  AROM AROM (Normal range in degrees) AROM   Lumbar   Flexion (65) Moderately limited*  Extension (30) Severely limited*  Right lateral flexion (25) Min limitation*  Left lateral flexion (25) Severe limitation*  Right rotation (30) Min limitation  Left rotation (30) Mod limitation*  Hip Right Left  Flexion (125) WNL* WNL*  Extension (15)    Abduction (40)    Adduction     Internal Rotation (45) 45 50  External Rotation (45) 23 22      Knee    Flexion (135) WNL WNL  Extension (0) WNL WNL  (* = pain; Blank rows = not tested)  LE MMT: MMT (out of 5) Right  Left   Hip flexion 4+* (left low back pain) 5  Hip extension    Hip abduction 5 5  Hip adduction 5 5  Hip internal rotation 5 5  Hip external rotation 5 5  Knee flexion 5 5  Knee extension 5 5  Ankle dorsiflexion 3+ 5  Ankle plantarflexion Strong Strong  Ankle inversion 4 5  Ankle eversion 4 5  (* = pain; Blank rows = not tested)  Sensation Pt reports decreased sensation along L5 and S1 dermatomes of R ankle (anterior and lateral ankle). Otherwise RLE sensation intact. Proprioception, stereognosis, and hot/cold testing deferred on this date.  Reflexes R/L Knee Jerk (L3/4): 2+/2+  Ankle Jerk (S1/2): 1+/0   Muscle Length Hamstrings: R: Positive for tightness around 75 degrees L: Positive for tightness around 75 degrees [place] (quadriceps): R: Not examined L: Not examined Thomas (hip flexors): R: Not examined L: Not examined Ober: R: Not examined L: Not examined  Palpation Location Right Left         Lumbar paraspinals 1 1  Quadratus Lumborum 1 1  Gluteus Maximus 0 0  Gluteus Medius 0 0  Deep hip external rotators 0 0  PSIS 0 0  Fortin's Area (SIJ) 0 0  Greater Trochanter 0 0  (Blank rows = not  tested) Graded on 0-4 scale (0 = no pain, 1 = pain, 2 = pain with wincing/grimacing/flinching, 3 = pain with withdrawal, 4 = unwilling to allow palpation)  Passive Accessory Intervertebral Motion Pt reports reproduction of low back pain and L hip pain with CPA L3-L5 , R UPA L3-L5, and L UPA L2-L5. Unable to truly assess mobility secondary to pain;  Special Tests Lumbar Radiculopathy and Discogenic: Centralization and Peripheralization (SN 92, -LR 0.12): Not examined Slump (SN 83, -LR 0.32): R: Positive for low back pain L: Negative SLR (SN 92, -LR 0.29): R: Negative L:  Negative Crossed SLR (SP 90): R: Negative L: Negative  Facet Joint: Extension-Rotation (SN 100, -LR 0.0): R: Negative L: Negative  Lumbar Foraminal Stenosis: Lumbar quadrant (SN 70): R: Positive L: Negative  Hip: FABER (SN 81): R: Positive for low back pain L: Positive for low back pain FADIR (SN 94): R: Negative L: Negative Hip scour (SN 50): R: Negative L: Positive for low back pain  SIJ:  Thigh Thrust (SN 88, -LR 0.18) : R: Not examined  L: Not examined  Piriformis Syndrome: FAIR Test (SN 88, SP 83): R: Not examined L: Not examined  Functional Tasks Deferred  Beighton scale Deferred   TODAY'S TREATMENT:   SUBJECTIVE: Pt reports that she is doing well today. Pt is reporting 7/10 low back pain upon arrival today. She had an appt with the neurosurgeon 03/23/23 and is waiting to schedule an MRI and NCV. No questions or concerns upon arrival.   PAIN: 7/10 low back pain at beginning of session; 5/10 end of session   Manual Therapy  Prone grade I-II lumbar mobilizations L1-L5, 30s/bout x 3 bouts/level; Prone STM to lumbar region x 8 minutes; Hooklying single knee to chest 3 x 30s hold BLE;  Double knee to chest stretch 3 x 30s hold; Hooklying lumbar rotation rocking 3s hold x 10 each direction;   TherEx NuStep warmup L1-L4 x 10 mins with moist heat pack on low back; Hooklying posterior pelvic tilts 3  x 10 with 3s hold; Hooklying ball squeeze bridges with focus on glute contraction initiating with posterior tilt 2 x 10;   Not performed: L sidelying R lumbar gapping mobilization 3 x 30s; Seated sciatic nerve glides x 10 BLE; Hooklying posterior pelvic tilts with alternating bicycles x 10 BLE (very challenging for patient); Hooklying posterior pelvic tilt with marching x 10 BLE (very challenging for pt today);   PATIENT EDUCATION:  Education details: Pt educated throughout session about proper posture and technique with exercises. Improved exercise technique, movement at target joints, use of target muscles after min to mod verbal, visual, tactile cues.  Person educated: Patient Education method: Explanation, Tactile cues, and Verbal cues Education comprehension: verbalized understanding, returned demonstration, and verbal cues required   HOME EXERCISE PROGRAM:  Access Code: [email or web address] URL: [email or web address] Date: 02/09/2023 Prepared by: Ria Comment  Exercises - Supine Posterior Pelvic Tilt  - 1-2 x daily - 7 x weekly - 2 sets - 10 reps - 3-5s hold - Supine Knee Rocks  - 1-2 x daily - 7 x weekly - 2 sets - 10 reps - 3-5s hold - Standing Distal Sciatic Nerve Mobilization on Step  - 1-2 x daily - 7 x weekly - 2 sets - 10 reps - 3-5s hold   ASSESSMENT:  CLINICAL IMPRESSION: Pt continues to have some discomfort with ADLs, especially getting in and out of bed. Continued manual techniques for decompression. Pt responded well to manual therapy today. Pain decreased from 7/10 NPS at start of session to 5/10 NPS at end of session. No HEP modifications today. Pt encouraged to follow-up as scheduled. Pt will benefit from PT services to address deficits in strength, ROM, and pain in order to return to full function at home with less back pain.  OBJECTIVE IMPAIRMENTS: decreased activity tolerance, decreased strength, obesity, and pain.   ACTIVITY LIMITATIONS: lifting,  bending, sitting, squatting, and caring for others  PARTICIPATION LIMITATIONS: meal prep, cleaning, laundry, and community activity  PERSONAL FACTORS: Past/current experiences, Time since onset of injury/illness/exacerbation, and 3+ comorbidities: OA, migraines, and obesity  are also affecting patient's functional outcome.   REHAB POTENTIAL: Fair    CLINICAL DECISION MAKING: Unstable/unpredictable  EVALUATION COMPLEXITY: High   GOALS: Goals reviewed with patient? No  SHORT TERM GOALS: Target date: 03/04/2023   Pt will be independent with HEP in order to improve strength and decrease back pain to improve pain-free function at home and work. Baseline:  Goal status: INITIAL   LONG TERM GOALS: Target date: 04/01/2023  Pt will increase FOTO to at least 68 to demonstrate significant improvement in function at home and work related to back pain  Baseline: 56; 03/16/23: 40 Goal status: Ongoing  2.  Pt will decrease worst back pain by at least 2 points on the NPRS in order to demonstrate clinically significant reduction in back pain. Baseline: 5/10; 03/26/23: 10/10 worst, 5/5 best Goal status: Ongoing  3.  Pt will decrease mODI score by at least 13 points in order demonstrate clinically significant reduction in back pain/disability.       Baseline: 03/16/23: 50% Goal status: INITIAL  4. Pt will increase strength of R ankle dorsiflexion to at least 4+/5 MMT grade in order to demonstrate improvement in strength and function  Baseline: 3+/5 Goal status: Deferred    PLAN: PT FREQUENCY: 1-2x/week  PT DURATION: 8 weeks  PLANNED INTERVENTIONS: Therapeutic exercises, Therapeutic activity, Neuromuscular re-education, Balance training, Gait training, Patient/Family education, Self Care, Joint mobilization, Joint manipulation, Vestibular training, Canalith repositioning, Orthotic/Fit training, DME instructions, Dry Needling, Electrical stimulation, Spinal manipulation, Spinal mobilization,  Cryotherapy, Moist heat, Taping, Traction, Ultrasound, Ionotophoresis 4mg /ml Dexamethasone, Manual therapy, and Re-evaluation.  PLAN FOR NEXT SESSION: strength test MMT, progress stabilization exercises, assess response to manual techniques and progress as appropriate.  This entire session was performed under direct supervision and direction of a licensed therapist/therapist assistant . I have personally read, edited and approve of the note as written.   Sherri Sear, SPT [facility]   Jason D Huprich PT, DPT, GCS  Huprich,Jason, PT 03/25/2023, 12:16 PM

## 2023-03-28 ENCOUNTER — Ambulatory Visit: Payer: 59

## 2023-03-28 DIAGNOSIS — M5459 Other low back pain: Secondary | ICD-10-CM | POA: Diagnosis not present

## 2023-03-28 DIAGNOSIS — M6281 Muscle weakness (generalized): Secondary | ICD-10-CM

## 2023-03-28 NOTE — Therapy (Unsigned)
OUTPATIENT PHYSICAL THERAPY THORACOLUMBAR TREATMENT  Patient Name: Brittany Pham MRN: [id number] DOB:Mar 05, 1965, 58 y.o., female Today's Date: 03/29/2023  END OF SESSION:  PT End of Session - 03/28/23 0849     Visit Number 13    Number of Visits 17    Date for PT Re-Evaluation 04/01/23    Authorization Type eval: 02/04/23    PT Start Time 0845    PT Stop Time 0930    PT Time Calculation (min) 45 min    Activity Tolerance Patient tolerated treatment well    Behavior During Therapy [facility] for tasks assessed/performed            Past Medical History:  Diagnosis Date   Arthritis    Chickenpox    Complication of anesthesia    nausea after septoplasty   Depression    GERD (gastroesophageal reflux disease)    Migraines    2-3 per month   PONV (postoperative nausea and vomiting)    Post-viral cough syndrome 05/16/2018   Past Surgical History:  Procedure Laterality Date   CERVICAL ABLATION  2007   CERVICAL SPINE SURGERY  2014   COLONOSCOPY WITH PROPOFOL N/A 10/19/2018   Procedure: COLONOSCOPY WITH BIOPSY;  Surgeon: Midge Minium, MD;  Location: [facility] [place] [facility];  Service: Endoscopy;  Laterality: N/A;   POLYPECTOMY N/A 10/19/2018   Procedure: POLYPECTOMY;  Surgeon: Midge Minium, MD;  Location: [facility];  Service: Endoscopy;  Laterality: N/A;   SEPTOPLASTY  1987   Patient Active Problem List   Diagnosis Date Noted   Chronic left-sided back pain 01/10/2023   Acute right ankle pain 01/10/2023   Tobacco dependence 05/20/2022   OSA (obstructive sleep apnea) 05/01/2019   External hemorrhoid 05/01/2019   Encounter for screening colonoscopy    Polyp of ascending colon    Rectal polyp    Myopia of both eyes 05/09/2018   Presbyopia 05/09/2018   Genital herpes 09/19/2017   Frequent headaches 09/19/2017   Stress incontinence of urine 09/19/2017   Prediabetes 09/19/2017   Menopausal symptoms 09/19/2017   Migraines 09/22/2016   Chronic neck pain 09/22/2016   Anxiety  and depression 09/22/2016   Preventative health care 09/22/2016    PCP: Doreene Nest, NP  REFERRING PROVIDER: Doreene Nest, NP  REFERRING DIAG: M54.50,G89.29 (ICD-10-CM) - Chronic left-sided low back pain without sciatica  RATIONALE FOR EVALUATION AND TREATMENT: Rehabilitation  THERAPY DIAG: Other low back pain  Muscle weakness (generalized)  ONSET DATE: Late October  FOLLOW-UP APPT SCHEDULED WITH REFERRING PROVIDER: Yes   FROM INITIAL EVALUATION SUBJECTIVE:  SUBJECTIVE STATEMENT:  Low back pain and R foot weakness  PERTINENT HISTORY:  Pt referred for acute on chronic low back pain. She suffered a fall approximately 6 weeks ago with worsening of left low back pain and sudden R foot dorsiflexion weakness and numbness.  During the fall pt had a R ankle inversion sprain with subsequent swelling which has mostly resolved. She now complains of numbness in entire R foot up to the ankle. Symptoms have remained unchanged over the last few weeks. Pt has a history of a low back fracture playing softball in seventh grade with chronic low back pain since that time. However this is a significant acute worsening of her pain. She is currently retired and has been trying to lose weight recently. Pt has a history of chronic L hip pain as well and until about 6 months ago was seeing a [facility]. She also has a history of a cervical fusion in 2014. She denies any saddle paresthesia or loss of bowel/bladder control.  01/10/23: DG Ankle Complete Right: IMPRESSION: Mild to moderate lateral ankle soft tissue swelling versus patient body habitus. No acute fracture is seen.  01/10/23: EXAM: LUMBAR SPINE - 2-3 VIEW IMPRESSION: 1. Grade 1 anterolisthesis of L5 on S1 appears new from remote 10/10/2007 CT. 2. Chronic  bilateral L5 pars defects, also seen on prior remote 2009 CT. 3. Mild-to-moderate L5-S1 degenerative disc and endplate changes.  PAIN:    Pain Intensity: Present: 0/10, Best: 0/10, Worst: 5/10 Pain location: left low back  Pain Quality: "excruciating" Radiating: Yes, occasionally down RLE Numbness/Tingling: Yes, entire R foot to the ankle Focal Weakness: Yes, R ankle Aggravating factors: sit to/from stand  Relieving factors: heat, TENS unit Position of comfort: Standing 24-hour pain behavior: Improves throughout day History of prior back injury, pain, surgery, or therapy: Yes, spinal fracture in adolescence Dominant hand: left Imaging: Yes, plain films (see history); Red flags: Negative for bowel/bladder changes, saddle paresthesia, personal history of cancer, h/o spinal tumors, h/o compression fx, h/o abdominal aneurysm, abdominal pain, chills/fever, night sweats, nausea, vomiting, unrelenting pain;  PRECAUTIONS: None  WEIGHT BEARING RESTRICTIONS: No  FALLS: Has patient fallen in last 6 months? Yes. Number of falls 2  Living Environment Lives with: lives with their family, spouse and 69 year old twins Lives in: House/apartment, single level Stairs: ramp to enter Has following equipment at home: None  Prior level of function: Independent  Occupational demands: Retired Journalist, newspaper: caring for grandchildren, helping care for her extended family  Patient Goals: "I want to be more active."    OBJECTIVE:  Patient Surveys  FOTO 6, predicted improvement to 7  Cognition Patient is oriented to person, place, and time.  Recent memory is intact.  Remote memory is intact.  Attention span and concentration are intact.  Expressive speech is intact.  Patient's fund of knowledge is within normal limits for educational level.    Gross Musculoskeletal Assessment Tremor: None Bulk: Normal Tone: Normal No visible step-off along spinal column, no signs of scoliosis Mild  swelling noted in R ankle just superior to lateral malleoli.  GAIT: Deferred full gait assessment  Posture: Lumbar lordosis: WNL Iliac crest height: Equal bilaterally Lumbar lateral shift: Negative  AROM AROM (Normal range in degrees) AROM   Lumbar   Flexion (65) Moderately limited*  Extension (30) Severely limited*  Right lateral flexion (25) Min limitation*  Left lateral flexion (25) Severe limitation*  Right rotation (30) Min limitation  Left rotation (30) Mod limitation*  Hip Right Left  Flexion (125) WNL* WNL*  Extension (15)    Abduction (40)    Adduction     Internal Rotation (45) 45 50  External Rotation (45) 23 22      Knee    Flexion (135) WNL WNL  Extension (0) WNL WNL  (* = pain; Blank rows = not tested)  LE MMT: MMT (out of 5) Right  Left   Hip flexion 4+* (left low back pain) 5  Hip extension    Hip abduction 5 5  Hip adduction 5 5  Hip internal rotation 5 5  Hip external rotation 5 5  Knee flexion 5 5  Knee extension 5 5  Ankle dorsiflexion 3+ 5  Ankle plantarflexion Strong Strong  Ankle inversion 4 5  Ankle eversion 4 5  (* = pain; Blank rows = not tested)  Sensation Pt reports decreased sensation along L5 and S1 dermatomes of R ankle (anterior and lateral ankle). Otherwise RLE sensation intact. Proprioception, stereognosis, and hot/cold testing deferred on this date.  Reflexes R/L Knee Jerk (L3/4): 2+/2+  Ankle Jerk (S1/2): 1+/0   Muscle Length Hamstrings: R: Positive for tightness around 75 degrees L: Positive for tightness around 75 degrees [place] (quadriceps): R: Not examined L: Not examined Thomas (hip flexors): R: Not examined L: Not examined Ober: R: Not examined L: Not examined  Palpation Location Right Left         Lumbar paraspinals 1 1  Quadratus Lumborum 1 1  Gluteus Maximus 0 0  Gluteus Medius 0 0  Deep hip external rotators 0 0  PSIS 0 0  Fortin's Area (SIJ) 0 0  Greater Trochanter 0 0  (Blank rows = not  tested) Graded on 0-4 scale (0 = no pain, 1 = pain, 2 = pain with wincing/grimacing/flinching, 3 = pain with withdrawal, 4 = unwilling to allow palpation)  Passive Accessory Intervertebral Motion Pt reports reproduction of low back pain and L hip pain with CPA L3-L5 , R UPA L3-L5, and L UPA L2-L5. Unable to truly assess mobility secondary to pain;  Special Tests Lumbar Radiculopathy and Discogenic: Centralization and Peripheralization (SN 92, -LR 0.12): Not examined Slump (SN 83, -LR 0.32): R: Positive for low back pain L: Negative SLR (SN 92, -LR 0.29): R: Negative L:  Negative Crossed SLR (SP 90): R: Negative L: Negative  Facet Joint: Extension-Rotation (SN 100, -LR 0.0): R: Negative L: Negative  Lumbar Foraminal Stenosis: Lumbar quadrant (SN 70): R: Positive L: Negative  Hip: FABER (SN 81): R: Positive for low back pain L: Positive for low back pain FADIR (SN 94): R: Negative L: Negative Hip scour (SN 50): R: Negative L: Positive for low back pain  SIJ:  Thigh Thrust (SN 88, -LR 0.18) : R: Not examined  L: Not examined  Piriformis Syndrome: FAIR Test (SN 88, SP 83): R: Not examined L: Not examined  Functional Tasks Deferred  Beighton scale Deferred   TODAY'S TREATMENT:   SUBJECTIVE: Pt reports that she is having some increased pain today. Pt is reporting 8/10 low back pain and 3/10 right leg pain upon arrival today. She had an appt with the neurosurgeon 03/23/23 and is waiting to schedule an MRI and NCV. No questions or concerns upon arrival.   PAIN: 8/10 low back pain, 3/10 right leg pain   Manual Therapy  Prone STM to lumbar region x 8 minutes; Hooklying single knee to chest 3 x 30s hold BLE; Double knee to chest stretch 3 x  30s hold; Hooklying lumbar rotation rocking 3s hold x 10 each direction;   TherEx NuStep warmup L1-L4 x 10 mins with moist heat pack on low back; Hooklying posterior pelvic tilts 3 x 10 with 3s hold;   E-Stim Prone IFC, 2 channels in  cross pattern, default parameters ([employer]) on lower back x 10 mins with moist heat pack (8.2v, increased to 8.6 after 5 mins);   Not performed: Prone grade I-II lumbar mobilizations L1-L5, 30s/bout x 3 bouts/level; L sidelying R lumbar gapping mobilization 3 x 30s; Seated sciatic nerve glides x 10 BLE; Hooklying posterior pelvic tilts with alternating bicycles x 10 BLE (very challenging for patient); Hooklying posterior pelvic tilt with marching x 10 BLE (very challenging for pt today); Hooklying ball squeeze bridges with focus on glute contraction initiating with posterior tilt 2 x 10;   PATIENT EDUCATION:  Education details: Pt educated throughout session about proper posture and technique with exercises. Improved exercise technique, movement at target joints, use of target muscles after min to mod verbal, visual, tactile cues.  Person educated: Patient Education method: Explanation, Tactile cues, and Verbal cues Education comprehension: verbalized understanding, returned demonstration, and verbal cues required   HOME EXERCISE PROGRAM:  Access Code: [email or web address] URL: [email or web address] Date: 02/09/2023 Prepared by: Ria Comment  Exercises - Supine Posterior Pelvic Tilt  - 1-2 x daily - 7 x weekly - 2 sets - 10 reps - 3-5s hold - Supine Knee Rocks  - 1-2 x daily - 7 x weekly - 2 sets - 10 reps - 3-5s hold - Standing Distal Sciatic Nerve Mobilization on Step  - 1-2 x daily - 7 x weekly - 2 sets - 10 reps - 3-5s hold   ASSESSMENT:  CLINICAL IMPRESSION: Pt continues to have some discomfort with ADLs, especially getting in and out of bed. Continued manual techniques for decompression. Pt responded well to manual therapy today. Pain decreased from 8/10 NPS with LBP at start of session to 6/10 NPS at end of session. No HEP modifications today. Pt encouraged to follow-up as scheduled. Pt will benefit from PT services to address deficits in strength, ROM, and pain in  order to return to full function at home with less back pain.  OBJECTIVE IMPAIRMENTS: decreased activity tolerance, decreased strength, obesity, and pain.   ACTIVITY LIMITATIONS: lifting, bending, sitting, squatting, and caring for others  PARTICIPATION LIMITATIONS: meal prep, cleaning, laundry, and community activity  PERSONAL FACTORS: Past/current experiences, Time since onset of injury/illness/exacerbation, and 3+ comorbidities: OA, migraines, and obesity  are also affecting patient's functional outcome.   REHAB POTENTIAL: Fair    CLINICAL DECISION MAKING: Unstable/unpredictable  EVALUATION COMPLEXITY: High   GOALS: Goals reviewed with patient? No  SHORT TERM GOALS: Target date: 03/04/2023   Pt will be independent with HEP in order to improve strength and decrease back pain to improve pain-free function at home and work. Baseline:  Goal status: INITIAL   LONG TERM GOALS: Target date: 04/01/2023  Pt will increase FOTO to at least 68 to demonstrate significant improvement in function at home and work related to back pain  Baseline: 56; 03/16/23: 40 Goal status: Ongoing  2.  Pt will decrease worst back pain by at least 2 points on the NPRS in order to demonstrate clinically significant reduction in back pain. Baseline: 5/10; 03/26/23: 10/10 worst, 5/5 best Goal status: Ongoing  3.  Pt will decrease mODI score by at least 13 points in order demonstrate clinically significant reduction in back pain/disability.  Baseline: 03/16/23: 50% Goal status: INITIAL  4. Pt will increase strength of R ankle dorsiflexion to at least 4+/5 MMT grade in order to demonstrate improvement in strength and function  Baseline: 3+/5 Goal status: Deferred    PLAN: PT FREQUENCY: 1-2x/week  PT DURATION: 8 weeks  PLANNED INTERVENTIONS: Therapeutic exercises, Therapeutic activity, Neuromuscular re-education, Balance training, Gait training, Patient/Family education, Self Care, Joint mobilization,  Joint manipulation, Vestibular training, Canalith repositioning, Orthotic/Fit training, DME instructions, Dry Needling, Electrical stimulation, Spinal manipulation, Spinal mobilization, Cryotherapy, Moist heat, Taping, Traction, Ultrasound, Ionotophoresis 4mg /ml Dexamethasone, Manual therapy, and Re-evaluation.  PLAN FOR NEXT SESSION: recertification, strength test MMT, progress stabilization exercises, assess response to manual techniques and progress as appropriate.   Sherri Sear, SPT [facility]   Jason D Huprich PT, DPT, GCS  Huprich,Jason, PT 03/29/2023, 8:34 AM

## 2023-04-08 ENCOUNTER — Ambulatory Visit: Payer: 59 | Attending: Primary Care

## 2023-04-08 DIAGNOSIS — M6281 Muscle weakness (generalized): Secondary | ICD-10-CM | POA: Diagnosis present

## 2023-04-08 DIAGNOSIS — M5459 Other low back pain: Secondary | ICD-10-CM | POA: Insufficient documentation

## 2023-04-08 NOTE — Therapy (Signed)
 OUTPATIENT PHYSICAL THERAPY THORACOLUMBAR TREATMENT  Patient Name: Brittany Pham MRN: [id number] DOB:[birth year], 58 y.o., female Today's Date: 04/08/2023  END OF SESSION:  PT End of Session - 04/08/23 0842     Visit Number 14    Number of Visits 33    Date for PT Re-Evaluation 06/03/23    Authorization Type eval: 02/04/23    PT Start Time 0845    PT Stop Time 0930    PT Time Calculation (min) 45 min    Activity Tolerance Patient tolerated treatment well    Behavior During Therapy [facility] for tasks assessed/performed            Past Medical History:  Diagnosis Date   Arthritis    Chickenpox    Complication of anesthesia    nausea after septoplasty   Depression    GERD (gastroesophageal reflux disease)    Migraines    2-3 per month   PONV (postoperative nausea and vomiting)    Post-viral cough syndrome 05/16/2018   Past Surgical History:  Procedure Laterality Date   CERVICAL ABLATION  2007   CERVICAL SPINE SURGERY  2014   COLONOSCOPY WITH PROPOFOL  N/A 10/19/2018   Procedure: COLONOSCOPY WITH BIOPSY;  Surgeon: Jinny Carmine, MD;  Location: [facility];  Service: Endoscopy;  Laterality: N/A;   POLYPECTOMY N/A 10/19/2018   Procedure: POLYPECTOMY;  Surgeon: Jinny Carmine, MD;  Location: [facility];  Service: Endoscopy;  Laterality: N/A;   SEPTOPLASTY  1987   Patient Active Problem List   Diagnosis Date Noted   Chronic left-sided back pain 01/10/2023   Acute right ankle pain 01/10/2023   Tobacco dependence 05/20/2022   OSA (obstructive sleep apnea) 05/01/2019   External hemorrhoid 05/01/2019   Encounter for screening colonoscopy    Polyp of ascending colon    Rectal polyp    Myopia of both eyes 05/09/2018   Presbyopia 05/09/2018   Genital herpes 09/19/2017   Frequent headaches 09/19/2017   Stress incontinence of urine 09/19/2017   Prediabetes 09/19/2017   Menopausal symptoms 09/19/2017   Migraines 09/22/2016   Chronic neck pain 09/22/2016   Anxiety  and depression 09/22/2016   Preventative health care 09/22/2016    PCP: Gretta Comer POUR, NP  REFERRING PROVIDER: Gretta Comer POUR, NP  REFERRING DIAG: M54.50,G89.29 (ICD-10-CM) - Chronic left-sided low back pain without sciatica  RATIONALE FOR EVALUATION AND TREATMENT: Rehabilitation  THERAPY DIAG: Other low back pain  Muscle weakness (generalized)  ONSET DATE: Late October  FOLLOW-UP APPT SCHEDULED WITH REFERRING PROVIDER: Yes   FROM INITIAL EVALUATION SUBJECTIVE:  SUBJECTIVE STATEMENT:  Low back pain and R foot weakness  PERTINENT HISTORY:  Pt referred for acute on chronic low back pain. She suffered a fall approximately 6 weeks ago with worsening of left low back pain and sudden R foot dorsiflexion weakness and numbness.  During the fall pt had a R ankle inversion sprain with subsequent swelling which has mostly resolved. She now complains of numbness in entire R foot up to the ankle. Symptoms have remained unchanged over the last few weeks. Pt has a history of a low back fracture playing softball in seventh grade with chronic low back pain since that time. However this is a significant acute worsening of her pain. She is currently retired and has been trying to lose weight recently. Pt has a history of chronic L hip pain as well and until about 6 months ago was seeing a land. She also has a history of a cervical fusion in 2014. She denies any saddle paresthesia or loss of bowel/bladder control.  01/10/23: DG Ankle Complete Right: IMPRESSION: Mild to moderate lateral ankle soft tissue swelling versus patient body habitus. No acute fracture is seen.  01/10/23: EXAM: LUMBAR SPINE - 2-3 VIEW IMPRESSION: 1. Grade 1 anterolisthesis of L5 on S1 appears new from remote 10/10/2007 CT. 2. Chronic  bilateral L5 pars defects, also seen on prior remote 2009 CT. 3. Mild-to-moderate L5-S1 degenerative disc and endplate changes.  PAIN:    Pain Intensity: Present: 0/10, Best: 0/10, Worst: 5/10 Pain location: left low back  Pain Quality: excruciating Radiating: Yes, occasionally down RLE Numbness/Tingling: Yes, entire R foot to the ankle Focal Weakness: Yes, R ankle Aggravating factors: sit to/from stand  Relieving factors: heat, TENS unit Position of comfort: Standing 24-hour pain behavior: Improves throughout day History of prior back injury, pain, surgery, or therapy: Yes, spinal fracture in adolescence Dominant hand: left Imaging: Yes, plain films (see history); Red flags: Negative for bowel/bladder changes, saddle paresthesia, personal history of cancer, h/o spinal tumors, h/o compression fx, h/o abdominal aneurysm, abdominal pain, chills/fever, night sweats, nausea, vomiting, unrelenting pain;  PRECAUTIONS: None  WEIGHT BEARING RESTRICTIONS: No  FALLS: Has patient fallen in last 6 months? Yes. Number of falls 2  Living Environment Lives with: lives with their family, spouse and 24 year old twins Lives in: House/apartment, single level Stairs: ramp to enter Has following equipment at home: None  Prior level of function: Independent  Occupational demands: Retired airline pilot  Hobbies: caring for grandchildren, helping care for her extended family  Patient Goals: I want to be more active.    OBJECTIVE:  Patient Surveys  FOTO 56, predicted improvement to 81  Cognition Patient is oriented to person, place, and time.  Recent memory is intact.  Remote memory is intact.  Attention span and concentration are intact.  Expressive speech is intact.  Patient's fund of knowledge is within normal limits for educational level.    Gross Musculoskeletal Assessment Tremor: None Bulk: Normal Tone: Normal No visible step-off along spinal column, no signs of scoliosis Mild  swelling noted in R ankle just superior to lateral malleoli.  GAIT: Deferred full gait assessment  Posture: Lumbar lordosis: WNL Iliac crest height: Equal bilaterally Lumbar lateral shift: Negative  AROM AROM (Normal range in degrees) AROM   Lumbar   Flexion (65) Moderately limited*  Extension (30) Severely limited*  Right lateral flexion (25) Min limitation*  Left lateral flexion (25) Severe limitation*  Right rotation (30) Min limitation  Left rotation (30) Mod limitation*  Hip Right Left  Flexion (125) WNL* WNL*  Extension (15)    Abduction (40)    Adduction     Internal Rotation (45) 45 50  External Rotation (45) 23 22      Knee    Flexion (135) WNL WNL  Extension (0) WNL WNL  (* = pain; Blank rows = not tested)  LE MMT: MMT (out of 5) Right  Left   Hip flexion 4+* (left low back pain) 5  Hip extension    Hip abduction 5 5  Hip adduction 5 5  Hip internal rotation 5 5  Hip external rotation 5 5  Knee flexion 5 5  Knee extension 5 5  Ankle dorsiflexion 3+ 5  Ankle plantarflexion Strong Strong  Ankle inversion 4 5  Ankle eversion 4 5  (* = pain; Blank rows = not tested)  Sensation Pt reports decreased sensation along L5 and S1 dermatomes of R ankle (anterior and lateral ankle). Otherwise RLE sensation intact. Proprioception, stereognosis, and hot/cold testing deferred on this date.  Reflexes R/L Knee Jerk (L3/4): 2+/2+  Ankle Jerk (S1/2): 1+/0   Muscle Length Hamstrings: R: Positive for tightness around 75 degrees L: Positive for tightness around 75 degrees [place] (quadriceps): R: Not examined L: Not examined Thomas (hip flexors): R: Not examined L: Not examined Ober: R: Not examined L: Not examined  Palpation Location Right Left         Lumbar paraspinals 1 1  Quadratus Lumborum 1 1  Gluteus Maximus 0 0  Gluteus Medius 0 0  Deep hip external rotators 0 0  PSIS 0 0  Fortin's Area (SIJ) 0 0  Greater Trochanter 0 0  (Blank rows = not  tested) Graded on 0-4 scale (0 = no pain, 1 = pain, 2 = pain with wincing/grimacing/flinching, 3 = pain with withdrawal, 4 = unwilling to allow palpation)  Passive Accessory Intervertebral Motion Pt reports reproduction of low back pain and L hip pain with CPA L3-L5 , R UPA L3-L5, and L UPA L2-L5. Unable to truly assess mobility secondary to pain;  Special Tests Lumbar Radiculopathy and Discogenic: Centralization and Peripheralization (SN 92, -LR 0.12): Not examined Slump (SN 83, -LR 0.32): R: Positive for low back pain L: Negative SLR (SN 92, -LR 0.29): R: Negative L:  Negative Crossed SLR (SP 90): R: Negative L: Negative  Facet Joint: Extension-Rotation (SN 100, -LR 0.0): R: Negative L: Negative  Lumbar Foraminal Stenosis: Lumbar quadrant (SN 70): R: Positive L: Negative  Hip: FABER (SN 81): R: Positive for low back pain L: Positive for low back pain FADIR (SN 94): R: Negative L: Negative Hip scour (SN 50): R: Negative L: Positive for low back pain  SIJ:  Thigh Thrust (SN 88, -LR 0.18) : R: Not examined  L: Not examined  Piriformis Syndrome: FAIR Test (SN 88, SP 83): R: Not examined L: Not examined  Functional Tasks Deferred  Beighton scale Deferred   TODAY'S TREATMENT:   SUBJECTIVE: Pt reports that she is having some increased pain today. She is reporting popping in right knee. Pt is reporting 8/10 low back pain and 3/10 right knee pain upon arrival today. She has an MRI scheduled for 04/19/23. No questions or concerns upon arrival.   PAIN: 8/10 low back pain, 3/10 right knee pain   Manual Therapy  Prone STM to lumbar region x 8 minutes; Prone grade I-II lumbar mobilizations L1-L5, 30s/bout x 3 bouts/level; Hooklying single knee to chest 3 x 30s hold BLE;  Double knee to chest stretch 3 x 30s hold; Hooklying lumbar rotation rocking 3s hold x 10 each direction;   TherEx NuStep warmup L1-L4 x 10 mins with moist heat pack on low back; Hooklying posterior pelvic  tilts x 10 with 3s hold (stopped due to pain);   Not performed: Prone IFC, 2 channels in cross pattern, default parameters ([employer]) on lower back x 10 mins with moist heat pack (8.2v, increased to 8.6 after 5 mins); L sidelying R lumbar gapping mobilization 3 x 30s; Seated sciatic nerve glides x 10 BLE; Hooklying posterior pelvic tilts with alternating bicycles x 10 BLE (very challenging for patient); Hooklying posterior pelvic tilt with marching x 10 BLE (very challenging for pt today); Hooklying ball squeeze bridges with focus on glute contraction initiating with posterior tilt 2 x 10;   PATIENT EDUCATION:  Education details: Pt educated throughout session about proper posture and technique with exercises. Improved exercise technique, movement at target joints, use of target muscles after min to mod verbal, visual, tactile cues.  Person educated: Patient Education method: Explanation, Tactile cues, and Verbal cues Education comprehension: verbalized understanding, returned demonstration, and verbal cues required   HOME EXERCISE PROGRAM:  Access Code: [email or web address] URL: [email or web address] Date: 02/09/2023 Prepared by: Selinda Eck  Exercises - Supine Posterior Pelvic Tilt  - 1-2 x daily - 7 x weekly - 2 sets - 10 reps - 3-5s hold - Supine Knee Rocks  - 1-2 x daily - 7 x weekly - 2 sets - 10 reps - 3-5s hold - Standing Distal Sciatic Nerve Mobilization on Step  - 1-2 x daily - 7 x weekly - 2 sets - 10 reps - 3-5s hold   ASSESSMENT:  CLINICAL IMPRESSION: Pt continues to have some discomfort with ADLs, especially getting in and out of bed. Continued manual techniques for decompression however she is heavily limited by pain. She continues to demonstrate significant R dorsiflexion weakness and her pain level remains high. She reports improvement from therapy and neurosurgery would like her to continue until a different plan of care is established. Pt responded well  to manual therapy today. No HEP modifications currently. Pt encouraged to follow-up as scheduled. She will benefit from PT services to address deficits in strength, ROM, and pain in order to return to full function at home with less back pain.  OBJECTIVE IMPAIRMENTS: decreased activity tolerance, decreased strength, obesity, and pain.   ACTIVITY LIMITATIONS: lifting, bending, sitting, squatting, and caring for others  PARTICIPATION LIMITATIONS: meal prep, cleaning, laundry, and community activity  PERSONAL FACTORS: Past/current experiences, Time since onset of injury/illness/exacerbation, and 3+ comorbidities: OA, migraines, and obesity  are also affecting patient's functional outcome.   REHAB POTENTIAL: Fair    CLINICAL DECISION MAKING: Unstable/unpredictable  EVALUATION COMPLEXITY: High   GOALS: Goals reviewed with patient? No  SHORT TERM GOALS: Target date: 03/04/2023   Pt will be independent with HEP in order to improve strength and decrease back pain to improve pain-free function at home and work. Baseline:  Goal status: INITIAL   LONG TERM GOALS: Target date: 04/01/2023  Pt will increase FOTO to at least 68 to demonstrate significant improvement in function at home and work related to back pain  Baseline: 56; 03/16/23: 40 Goal status: Ongoing  2.  Pt will decrease worst back pain by at least 2 points on the NPRS in order to demonstrate clinically significant reduction in back pain. Baseline: 5/10; 03/26/23: 10/10 worst, 5/5 best Goal status: Ongoing  3.  Pt  will decrease mODI score by at least 13 points in order demonstrate clinically significant reduction in back pain/disability.       Baseline: 03/16/23: 50% Goal status: INITIAL  4. Pt will increase strength of R ankle dorsiflexion to at least 4+/5 MMT grade in order to demonstrate improvement in strength and function  Baseline: 3+/5 Goal status: Deferred    PLAN: PT FREQUENCY: 1-2x/week  PT DURATION: 8  weeks  PLANNED INTERVENTIONS: Therapeutic exercises, Therapeutic activity, Neuromuscular re-education, Balance training, Gait training, Patient/Family education, Self Care, Joint mobilization, Joint manipulation, Vestibular training, Canalith repositioning, Orthotic/Fit training, DME instructions, Dry Needling, Electrical stimulation, Spinal manipulation, Spinal mobilization, Cryotherapy, Moist heat, Taping, Traction, Ultrasound, Ionotophoresis 4mg /ml Dexamethasone, Manual therapy, and Re-evaluation.  PLAN FOR NEXT SESSION: strength test MMT, progress stabilization exercises, assess response to manual techniques and progress as appropriate.   Matisha Termine, SPT [employer] DP[employer]   Jason D Huprich PT, DPT, GCS  Huprich,Jason, PT 04/08/2023, 6:27 PM

## 2023-04-11 ENCOUNTER — Encounter: Payer: Self-pay | Admitting: Neurology

## 2023-04-11 ENCOUNTER — Ambulatory Visit: Payer: 59

## 2023-04-11 ENCOUNTER — Other Ambulatory Visit: Payer: Self-pay

## 2023-04-11 DIAGNOSIS — R202 Paresthesia of skin: Secondary | ICD-10-CM

## 2023-04-14 ENCOUNTER — Ambulatory Visit: Payer: 59

## 2023-04-18 ENCOUNTER — Ambulatory Visit: Payer: 59

## 2023-04-18 DIAGNOSIS — M6281 Muscle weakness (generalized): Secondary | ICD-10-CM

## 2023-04-18 DIAGNOSIS — M5459 Other low back pain: Secondary | ICD-10-CM

## 2023-04-18 NOTE — Therapy (Signed)
OUTPATIENT PHYSICAL THERAPY THORACOLUMBAR TREATMENT  Patient Name: Brittany Pham MRN: [id number] DOB:04-01-1965, 58 y.o., female Today's Date: 04/18/2023  END OF SESSION:  PT End of Session - 04/18/23 0849     Visit Number 15    Number of Visits 33    Date for PT Re-Evaluation 06/03/23    Authorization Type eval: 02/04/23    PT Start Time 0845    PT Stop Time 0930    PT Time Calculation (min) 45 min    Activity Tolerance Patient tolerated treatment well    Behavior During Therapy [facility] for tasks assessed/performed            Past Medical History:  Diagnosis Date   Arthritis    Chickenpox    Complication of anesthesia    nausea after septoplasty   Depression    GERD (gastroesophageal reflux disease)    Migraines    2-3 per month   PONV (postoperative nausea and vomiting)    Post-viral cough syndrome 05/16/2018   Past Surgical History:  Procedure Laterality Date   CERVICAL ABLATION  2007   CERVICAL SPINE SURGERY  2014   COLONOSCOPY WITH PROPOFOL N/A 10/19/2018   Procedure: COLONOSCOPY WITH BIOPSY;  Surgeon: Midge Minium, MD;  Location: [facility];  Service: Endoscopy;  Laterality: N/A;   POLYPECTOMY N/A 10/19/2018   Procedure: POLYPECTOMY;  Surgeon: Midge Minium, MD;  Location: [facility];  Service: Endoscopy;  Laterality: N/A;   SEPTOPLASTY  1987   Patient Active Problem List   Diagnosis Date Noted   Chronic left-sided back pain 01/10/2023   Acute right ankle pain 01/10/2023   Tobacco dependence 05/20/2022   OSA (obstructive sleep apnea) 05/01/2019   External hemorrhoid 05/01/2019   Encounter for screening colonoscopy    Polyp of ascending colon    Rectal polyp    Myopia of both eyes 05/09/2018   Presbyopia 05/09/2018   Genital herpes 09/19/2017   Frequent headaches 09/19/2017   Stress incontinence of urine 09/19/2017   Prediabetes 09/19/2017   Menopausal symptoms 09/19/2017   Migraines 09/22/2016   Chronic neck pain 09/22/2016   Anxiety  and depression 09/22/2016   Preventative health care 09/22/2016    PCP: Doreene Nest, NP  REFERRING PROVIDER: Doreene Nest, NP  REFERRING DIAG: M54.50,G89.29 (ICD-10-CM) - Chronic left-sided low back pain without sciatica  RATIONALE FOR EVALUATION AND TREATMENT: Rehabilitation  THERAPY DIAG: Other low back pain  Muscle weakness (generalized)  ONSET DATE: Late October  FOLLOW-UP APPT SCHEDULED WITH REFERRING PROVIDER: Yes   FROM INITIAL EVALUATION SUBJECTIVE:  SUBJECTIVE STATEMENT:  Low back pain and R foot weakness  PERTINENT HISTORY:  Pt referred for acute on chronic low back pain. She suffered a fall approximately 6 weeks ago with worsening of left low back pain and sudden R foot dorsiflexion weakness and numbness.  During the fall pt had a R ankle inversion sprain with subsequent swelling which has mostly resolved. She now complains of numbness in entire R foot up to the ankle. Symptoms have remained unchanged over the last few weeks. Pt has a history of a low back fracture playing softball in seventh grade with chronic low back pain since that time. However this is a significant acute worsening of her pain. She is currently retired and has been trying to lose weight recently. Pt has a history of chronic L hip pain as well and until about 6 months ago was seeing a [facility]. She also has a history of a cervical fusion in 2014. She denies any saddle paresthesia or loss of bowel/bladder control.  01/10/23: DG Ankle Complete Right: IMPRESSION: Mild to moderate lateral ankle soft tissue swelling versus patient body habitus. No acute fracture is seen.  01/10/23: EXAM: LUMBAR SPINE - 2-3 VIEW IMPRESSION: 1. Grade 1 anterolisthesis of L5 on S1 appears new from remote 10/10/2007 CT. 2. Chronic  bilateral L5 pars defects, also seen on prior remote 2009 CT. 3. Mild-to-moderate L5-S1 degenerative disc and endplate changes.  PAIN:    Pain Intensity: Present: 0/10, Best: 0/10, Worst: 5/10 Pain location: left low back  Pain Quality: "excruciating" Radiating: Yes, occasionally down RLE Numbness/Tingling: Yes, entire R foot to the ankle Focal Weakness: Yes, R ankle Aggravating factors: sit to/from stand  Relieving factors: heat, TENS unit Position of comfort: Standing 24-hour pain behavior: Improves throughout day History of prior back injury, pain, surgery, or therapy: Yes, spinal fracture in adolescence Dominant hand: left Imaging: Yes, plain films (see history); Red flags: Negative for bowel/bladder changes, saddle paresthesia, personal history of cancer, h/o spinal tumors, h/o compression fx, h/o abdominal aneurysm, abdominal pain, chills/fever, night sweats, nausea, vomiting, unrelenting pain;  PRECAUTIONS: None  WEIGHT BEARING RESTRICTIONS: No  FALLS: Has patient fallen in last 6 months? Yes. Number of falls 2  Living Environment Lives with: lives with their family, spouse and 30 year old twins Lives in: House/apartment, single level Stairs: ramp to enter Has following equipment at home: None  Prior level of function: Independent  Occupational demands: Retired Journalist, newspaper: caring for grandchildren, helping care for her extended family  Patient Goals: "I want to be more active."    OBJECTIVE:  Patient Surveys  FOTO 61, predicted improvement to 75  Cognition Patient is oriented to person, place, and time.  Recent memory is intact.  Remote memory is intact.  Attention span and concentration are intact.  Expressive speech is intact.  Patient's fund of knowledge is within normal limits for educational level.    Gross Musculoskeletal Assessment Tremor: None Bulk: Normal Tone: Normal No visible step-off along spinal column, no signs of scoliosis Mild  swelling noted in R ankle just superior to lateral malleoli.  GAIT: Deferred full gait assessment  Posture: Lumbar lordosis: WNL Iliac crest height: Equal bilaterally Lumbar lateral shift: Negative  AROM AROM (Normal range in degrees) AROM   Lumbar   Flexion (65) Moderately limited*  Extension (30) Severely limited*  Right lateral flexion (25) Min limitation*  Left lateral flexion (25) Severe limitation*  Right rotation (30) Min limitation  Left rotation (30) Mod limitation*  Hip Right Left  Flexion (125) WNL* WNL*  Extension (15)    Abduction (40)    Adduction     Internal Rotation (45) 45 50  External Rotation (45) 23 22      Knee    Flexion (135) WNL WNL  Extension (0) WNL WNL  (* = pain; Blank rows = not tested)  LE MMT: MMT (out of 5) Right  Left   Hip flexion 4+* (left low back pain) 5  Hip extension    Hip abduction 5 5  Hip adduction 5 5  Hip internal rotation 5 5  Hip external rotation 5 5  Knee flexion 5 5  Knee extension 5 5  Ankle dorsiflexion 3+ 5  Ankle plantarflexion Strong Strong  Ankle inversion 4 5  Ankle eversion 4 5  (* = pain; Blank rows = not tested)  Sensation Pt reports decreased sensation along L5 and S1 dermatomes of R ankle (anterior and lateral ankle). Otherwise RLE sensation intact. Proprioception, stereognosis, and hot/cold testing deferred on this date.  Reflexes R/L Knee Jerk (L3/4): 2+/2+  Ankle Jerk (S1/2): 1+/0   Muscle Length Hamstrings: R: Positive for tightness around 75 degrees L: Positive for tightness around 75 degrees [place] (quadriceps): R: Not examined L: Not examined Thomas (hip flexors): R: Not examined L: Not examined Ober: R: Not examined L: Not examined  Palpation Location Right Left         Lumbar paraspinals 1 1  Quadratus Lumborum 1 1  Gluteus Maximus 0 0  Gluteus Medius 0 0  Deep hip external rotators 0 0  PSIS 0 0  Fortin's Area (SIJ) 0 0  Greater Trochanter 0 0  (Blank rows = not  tested) Graded on 0-4 scale (0 = no pain, 1 = pain, 2 = pain with wincing/grimacing/flinching, 3 = pain with withdrawal, 4 = unwilling to allow palpation)  Passive Accessory Intervertebral Motion Pt reports reproduction of low back pain and L hip pain with CPA L3-L5 , R UPA L3-L5, and L UPA L2-L5. Unable to truly assess mobility secondary to pain;  Special Tests Lumbar Radiculopathy and Discogenic: Centralization and Peripheralization (SN 92, -LR 0.12): Not examined Slump (SN 83, -LR 0.32): R: Positive for low back pain L: Negative SLR (SN 92, -LR 0.29): R: Negative L:  Negative Crossed SLR (SP 90): R: Negative L: Negative  Facet Joint: Extension-Rotation (SN 100, -LR 0.0): R: Negative L: Negative  Lumbar Foraminal Stenosis: Lumbar quadrant (SN 70): R: Positive L: Negative  Hip: FABER (SN 81): R: Positive for low back pain L: Positive for low back pain FADIR (SN 94): R: Negative L: Negative Hip scour (SN 50): R: Negative L: Positive for low back pain  SIJ:  Thigh Thrust (SN 88, -LR 0.18) : R: Not examined  L: Not examined  Piriformis Syndrome: FAIR Test (SN 88, SP 83): R: Not examined L: Not examined  Functional Tasks Deferred  Beighton scale Deferred   TODAY'S TREATMENT:   SUBJECTIVE: Pt reports that she is having some increased pain today. She is reporting "popping" in right knee. Pt is reporting 6/10 low back pain and 6/10 right knee pain upon arrival today. She has an MRI scheduled for tomorrow, 04/19/23. She also has a NCV study scheduled. No questions or concerns upon arrival.   PAIN: 6/10 low back pain, 6/10 right knee pain   Manual Therapy  Prone STM to lumbar region x 8 minutes; Prone grade I-II lumbar mobilizations L1-L5, 30s/bout x 3 bouts/level; Hooklying single  knee to chest 2 x 30s hold BLE; Double knee to chest stretch 2 x 30s hold; Hooklying lumbar rotation rocking 3s hold x 10 each direction;   TherEx NuStep warmup L1-L4 x 10 mins with moist heat  pack on low back; Hooklying posterior pelvic tilts x 10 with 3s hold (stopped due to pain); Hooklying posterior pelvic tilt with marching x 10 BLE;   Not performed: Prone IFC, 2 channels in cross pattern, default parameters ([employer]) on lower back x 10 mins with moist heat pack (8.2v, increased to 8.6 after 5 mins); L sidelying R lumbar gapping mobilization 3 x 30s; Seated sciatic nerve glides x 10 BLE; Hooklying posterior pelvic tilts with alternating bicycles x 10 BLE (very challenging for patient); Hooklying ball squeeze bridges with focus on glute contraction initiating with posterior tilt 2 x 10;   PATIENT EDUCATION:  Education details: Pt educated throughout session about proper posture and technique with exercises. Improved exercise technique, movement at target joints, use of target muscles after min to mod verbal, visual, tactile cues.  Person educated: Patient Education method: Explanation, Tactile cues, and Verbal cues Education comprehension: verbalized understanding, returned demonstration, and verbal cues required   HOME EXERCISE PROGRAM:  Access Code: [email or web address] URL: [email or web address] Date: 02/09/2023 Prepared by: Ria Comment  Exercises - Supine Posterior Pelvic Tilt  - 1-2 x daily - 7 x weekly - 2 sets - 10 reps - 3-5s hold - Supine Knee Rocks  - 1-2 x daily - 7 x weekly - 2 sets - 10 reps - 3-5s hold - Standing Distal Sciatic Nerve Mobilization on Step  - 1-2 x daily - 7 x weekly - 2 sets - 10 reps - 3-5s hold   ASSESSMENT:  CLINICAL IMPRESSION: Pt continues to have some discomfort with ADLs, especially getting in and out of bed. Continued manual techniques for decompression however she is heavily limited by pain. She continues to demonstrate significant R dorsiflexion weakness and her pain level remains high. She reports improvement from therapy and neurosurgery would like her to continue until a different plan of care is established. Pt  responded well to manual therapy today. No HEP modifications currently. Pt encouraged to follow-up as scheduled. She will benefit from PT services to address deficits in strength, ROM, and pain in order to return to full function at home with less back pain.  OBJECTIVE IMPAIRMENTS: decreased activity tolerance, decreased strength, obesity, and pain.   ACTIVITY LIMITATIONS: lifting, bending, sitting, squatting, and caring for others  PARTICIPATION LIMITATIONS: meal prep, cleaning, laundry, and community activity  PERSONAL FACTORS: Past/current experiences, Time since onset of injury/illness/exacerbation, and 3+ comorbidities: OA, migraines, and obesity  are also affecting patient's functional outcome.   REHAB POTENTIAL: Fair    CLINICAL DECISION MAKING: Unstable/unpredictable  EVALUATION COMPLEXITY: High   GOALS: Goals reviewed with patient? No  SHORT TERM GOALS: Target date: 03/04/2023   Pt will be independent with HEP in order to improve strength and decrease back pain to improve pain-free function at home and work. Baseline:  Goal status: INITIAL   LONG TERM GOALS: Target date: 04/01/2023  Pt will increase FOTO to at least 68 to demonstrate significant improvement in function at home and work related to back pain  Baseline: 56; 03/16/23: 40 Goal status: Ongoing  2.  Pt will decrease worst back pain by at least 2 points on the NPRS in order to demonstrate clinically significant reduction in back pain. Baseline: 5/10; 03/26/23: 10/10 worst, 5/5 best Goal status: Ongoing  3.  Pt will decrease mODI score by at least 13 points in order demonstrate clinically significant reduction in back pain/disability.       Baseline: 03/16/23: 50% Goal status: INITIAL  4. Pt will increase strength of R ankle dorsiflexion to at least 4+/5 MMT grade in order to demonstrate improvement in strength and function  Baseline: 3+/5 Goal status: Deferred    PLAN: PT FREQUENCY: 1-2x/week  PT DURATION:  8 weeks  PLANNED INTERVENTIONS: Therapeutic exercises, Therapeutic activity, Neuromuscular re-education, Balance training, Gait training, Patient/Family education, Self Care, Joint mobilization, Joint manipulation, Vestibular training, Canalith repositioning, Orthotic/Fit training, DME instructions, Dry Needling, Electrical stimulation, Spinal manipulation, Spinal mobilization, Cryotherapy, Moist heat, Taping, Traction, Ultrasound, Ionotophoresis 4mg /ml Dexamethasone, Manual therapy, and Re-evaluation.  PLAN FOR NEXT SESSION: strength test MMT, progress stabilization exercises, assess response to manual techniques and progress as appropriate.   Sherri Sear, SPT [facility]   Jason D Huprich PT, DPT, GCS  Huprich,Jason, PT 04/18/2023, 12:26 PM

## 2023-04-21 ENCOUNTER — Ambulatory Visit: Payer: 59

## 2023-04-25 ENCOUNTER — Ambulatory Visit (INDEPENDENT_AMBULATORY_CARE_PROVIDER_SITE_OTHER): Payer: 59 | Admitting: Neurology

## 2023-04-25 ENCOUNTER — Ambulatory Visit: Payer: 59

## 2023-04-25 DIAGNOSIS — G5731 Lesion of lateral popliteal nerve, right lower limb: Secondary | ICD-10-CM

## 2023-04-25 DIAGNOSIS — R202 Paresthesia of skin: Secondary | ICD-10-CM | POA: Diagnosis not present

## 2023-04-25 NOTE — Procedures (Signed)
 [facility]  [address] [place], [address]  [place], [place] [address] Tel: [phone] Fax: [phone] Test Date:  04/25/2023  Patient: Brittany Pham DOB: 01/13/1966 Physician: Jacquelyne Balint, MD  Sex: Female Height: 5\' 3"  Ref Phys: Marikay Alar, MD  ID#: [id number]   Technician:    History: This is a 3 year of female with right foot drop.  NCV & EMG Findings: Extensive electrodiagnostic evaluation of the right lower limb with additional nerve conduction studies of the left lower limb shows: Bilateral superficial peroneal/fibular sensory responses are absent. Right sural sensory response is within normal limits. Right peroneal/fibular (TA) motor response shows mild amplitude loss from popliteal fossa to below fibular head stimulation sites (3.2 mV distally and 1.9 mV proximally). Left peroneal/fibular (TA) motor response is within normal limits. Right peroneal/fibular (EDB) and tibial (AH) motor responses are within normal limits. Right H reflex latency is within normal limits. Chronic motor axon loss changes without accompanying active denervation changes are seen in the right tibialis anterior and right peroneus/fibularis longus muscles.  Neuromuscular Ultrasound Findings: High frequency (4.0-16.0 MHz) B-mode, nonvascular ultrasound of the right lower limb shows a normal cross sectional area of the peroneal/fibular (popliteal fossa to fibular head) nerve.   Impression: This is a complex, but abnormal study. 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 There is some amplitude reduction in the right peroneal/fibular (TA) motor response across the fibular head. However, the peroneal/fibular sensory response being absent bilaterally is likely technical in nature, and the  peroneal/fibular nerve appears normal on ultrasound at the popliteal fossa and fibular head. There are no other L5 innervated muscles that are abnormal on needle examination to suggest a lumbosacral radiculopathy responsible for symptoms. No definite right lumbosacral (L3-S1) motor radiculopathy. No electrodiagnostic evidence of a large fiber sensorimotor neuropathy.   ___________________________ Jacquelyne Balint, MD    NCS+ Motor Nerve Results    Latency Amplitude F-Lat Segment Distance CV Comment  Site (ms) Norm (mV) Norm (ms)  (cm) (m/s) Norm   Right Fibular (EDB) Motor  Ankle 3.5  < 6.0 3.7  > 2.5        Bel fib head 10.3 - 3.1 -  Bel fib head-Ankle 30 44  > 40   Pop fossa 12.1 - 2.4 -  Pop fossa-Bel fib head 8.5 47 -   Left Fibular (TA) Motor  Fib head 2.2  < 4.5 3.8  > 3.0        Pop fossa 4.0  < 6.7 3.0 -  Pop fossa-Fib head 8.5 47  > 40   Right Fibular (TA) Motor  Fib head 2.0  < 4.5 3.2  > 3.0        Pop fossa 4.0  < 6.7 1.90 -  Pop fossa-Fib head 8 40  > 40   Right Tibial (AH) Motor  Ankle 2.6  < 6.0 14.2  > 4.0        Knee 11.1 - 10.7 -  Knee-Ankle 37 44  > 40    Sensory Sites    Neg Peak Lat Amplitude (O-P) Segment Distance Velocity Comment  Site (ms) Norm (V) Norm  (cm) (ms)   Left Superficial Fibular Sensory  14 cm-Ankle *NR  < 4.6 *NR  > 4 14 cm-Ankle 14    Right Superficial Fibular Sensory  14  cm-Ankle *NR  < 4.6 *NR  > 4 14 cm-Ankle 14    Right Sural Sensory  Calf-Lat mall 3.3  < 4.6 4  > 4 Calf-Lat mall 12     H-Reflex Results    M-Lat H Lat H Neg Amp H-M Lat  Site (ms) (ms) Norm (mV) (ms)  Right Tibial H-Reflex  Pop fossa 4.6 32.2  < 35.0 0.58 27.6   EMG+   Side Muscle Ins.Act Fibs Fasc Recrt Amp Dur Poly Activation Comment  Right Tib ant Nml Nml Nml *3- *1+ *1+ *1+ Nml N/A  Right Gastroc MH Nml Nml Nml Nml Nml Nml Nml Nml N/A  Right FDL Nml Nml Nml Nml Nml Nml Nml Nml N/A  Right Fib longus Nml Nml Nml *2- *1+ *1+ *1+ Nml N/A  Right Rectus fem Nml  Nml Nml Nml Nml Nml Nml Nml N/A  Right Biceps fem SH Nml Nml Nml Nml Nml Nml Nml Nml N/A  Right Gluteus med Nml Nml Nml Nml Nml Nml Nml Nml N/A  Right Lumbar PSP lower Nml Nml Nml Nml Nml Nml Nml Nml N/A   Nerve Measurements   Site Area Mobility Vascularity Comment   mm Norm     Right Fibular  Fib head 7.2  < 17.8      Pop fossa 6.7  < 20.9          Waveforms:  Motor           Sensory        H-Reflex

## 2023-04-28 ENCOUNTER — Ambulatory Visit: Payer: 59

## 2023-04-30 HISTORY — PX: OTHER SURGICAL HISTORY: SHX169

## 2023-05-02 ENCOUNTER — Encounter: Payer: Self-pay | Admitting: Primary Care

## 2023-05-02 ENCOUNTER — Ambulatory Visit (INDEPENDENT_AMBULATORY_CARE_PROVIDER_SITE_OTHER): Payer: 59 | Admitting: Primary Care

## 2023-05-02 VITALS — BP 146/80 | HR 93 | Temp 97.3°F | Ht 63.0 in | Wt 211.0 lb

## 2023-05-02 DIAGNOSIS — G43009 Migraine without aura, not intractable, without status migrainosus: Secondary | ICD-10-CM | POA: Diagnosis not present

## 2023-05-02 DIAGNOSIS — M25561 Pain in right knee: Secondary | ICD-10-CM

## 2023-05-02 DIAGNOSIS — M545 Low back pain, unspecified: Secondary | ICD-10-CM

## 2023-05-02 DIAGNOSIS — G8929 Other chronic pain: Secondary | ICD-10-CM | POA: Diagnosis not present

## 2023-05-02 NOTE — Assessment & Plan Note (Signed)
 Following with neurosurgery. She will update regarding decision to undergo surgery.
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
## 2023-05-02 NOTE — Assessment & Plan Note (Signed)
 Deteriorated recently given stress. Continue Maxalt 10 mg as needed. Consider preventative treatment if needed.  She will update.

## 2023-05-09 ENCOUNTER — Encounter: Payer: 59 | Admitting: Neurology

## 2023-05-20 ENCOUNTER — Encounter: Payer: Self-pay | Admitting: Primary Care

## 2023-05-20 ENCOUNTER — Ambulatory Visit
Admission: RE | Admit: 2023-05-20 | Discharge: 2023-05-20 | Disposition: A | Discharge status: Home / Self Care | Source: Ambulatory Visit | Attending: Primary Care | Admitting: Primary Care

## 2023-05-20 ENCOUNTER — Ambulatory Visit (INDEPENDENT_AMBULATORY_CARE_PROVIDER_SITE_OTHER): Payer: BC Managed Care – PPO | Admitting: Primary Care

## 2023-05-20 VITALS — BP 136/86 | HR 97 | Temp 98.4°F | Ht 63.0 in | Wt 213.0 lb

## 2023-05-20 DIAGNOSIS — Z8249 Family history of ischemic heart disease and other diseases of the circulatory system: Secondary | ICD-10-CM

## 2023-05-20 DIAGNOSIS — M25512 Pain in left shoulder: Secondary | ICD-10-CM

## 2023-05-20 DIAGNOSIS — G43009 Migraine without aura, not intractable, without status migrainosus: Secondary | ICD-10-CM | POA: Diagnosis not present

## 2023-05-20 DIAGNOSIS — R519 Headache, unspecified: Secondary | ICD-10-CM

## 2023-05-20 DIAGNOSIS — H524 Presbyopia: Secondary | ICD-10-CM

## 2023-05-20 DIAGNOSIS — G4733 Obstructive sleep apnea (adult) (pediatric): Secondary | ICD-10-CM

## 2023-05-20 DIAGNOSIS — F32A Depression, unspecified: Secondary | ICD-10-CM

## 2023-05-20 DIAGNOSIS — E785 Hyperlipidemia, unspecified: Secondary | ICD-10-CM

## 2023-05-20 DIAGNOSIS — A6000 Herpesviral infection of urogenital system, unspecified: Secondary | ICD-10-CM | POA: Diagnosis not present

## 2023-05-20 DIAGNOSIS — Z0001 Encounter for general adult medical examination with abnormal findings: Secondary | ICD-10-CM

## 2023-05-20 DIAGNOSIS — Z1231 Encounter for screening mammogram for malignant neoplasm of breast: Secondary | ICD-10-CM

## 2023-05-20 DIAGNOSIS — M545 Low back pain, unspecified: Secondary | ICD-10-CM

## 2023-05-20 DIAGNOSIS — F419 Anxiety disorder, unspecified: Secondary | ICD-10-CM

## 2023-05-20 DIAGNOSIS — G8929 Other chronic pain: Secondary | ICD-10-CM

## 2023-05-20 DIAGNOSIS — R7303 Prediabetes: Secondary | ICD-10-CM

## 2023-05-20 LAB — LIPID PANEL
Cholesterol: 201 mg/dL — ABNORMAL HIGH (ref 0–200)
HDL: 51.6 mg/dL (ref >39.00)
LDL Cholesterol: 123 mg/dL — ABNORMAL HIGH (ref 0–99)
NonHDL: 148.94
Total CHOL/HDL Ratio: 4
Triglycerides: 130 mg/dL (ref 0.0–149.0)
VLDL: 26 mg/dL (ref 0.0–40.0)

## 2023-05-20 LAB — COMPREHENSIVE METABOLIC PANEL
ALT: 17 U/L (ref 0–35)
AST: 13 U/L (ref 0–37)
Albumin: 4.5 g/dL (ref 3.5–5.2)
Alkaline Phosphatase: 64 U/L (ref 39–117)
BUN: 10 mg/dL (ref 6–23)
CO2: 28 meq/L (ref 19–32)
Calcium: 9.5 mg/dL (ref 8.4–10.5)
Chloride: 102 meq/L (ref 96–112)
Creatinine, Ser: 0.84 mg/dL (ref 0.40–1.20)
GFR: 77.92 mL/min (ref >60.00)
Glucose, Bld: 96 mg/dL (ref 70–99)
Potassium: 5 meq/L (ref 3.5–5.1)
Sodium: 139 meq/L (ref 135–145)
Total Bilirubin: 0.5 mg/dL (ref 0.2–1.2)
Total Protein: 7 g/dL (ref 6.0–8.3)

## 2023-05-20 LAB — HEMOGLOBIN A1C: Hgb A1c MFr Bld: 6.2 % (ref 4.6–6.5)

## 2023-05-20 NOTE — Patient Instructions (Addendum)
 Medications:  No changes   Lab work:  Due today - we will be in touch with results  Testing:  Shoulder x-ray  Follow up:  1 year for annual physical     Due for mammogram - call [facility] Due for colonoscopy - August 2025

## 2023-05-20 NOTE — Assessment & Plan Note (Signed)
 Controlled.  Continue CPAP machine nightly

## 2023-05-20 NOTE — Assessment & Plan Note (Signed)
 Repeat A1C pending.

## 2023-05-20 NOTE — Assessment & Plan Note (Signed)
Controlled.  Continue duloxetine 90 mg daily.
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
## 2023-05-20 NOTE — Assessment & Plan Note (Addendum)
 Following with neurosurgery.   Continue injections as planned.

## 2023-05-20 NOTE — Assessment & Plan Note (Signed)
 Improved and controlled.  Continue rizatriptan 10 mg PRN

## 2023-05-20 NOTE — Assessment & Plan Note (Signed)
 Immunizations UTD.  Pap smear UTD. Mammogram due, orders placed. Colonoscopy UTD, due in August 2025, discussed with patient.   Discussed the importance of a healthy diet and regular exercise in order for weight loss, and to reduce the risk of further co-morbidity.  Exam stable. Labs pending.  Follow up in 1 year for repeat physical.

## 2023-05-20 NOTE — Assessment & Plan Note (Signed)
 Controlled.  Continue Acyclovir 400 mg BID for prevention.

## 2023-05-20 NOTE — Assessment & Plan Note (Signed)
 Differentials include partial rotator cuff tear.   Checking plain films today. May require MRI.   She will also update her orthopedic provider.

## 2023-05-20 NOTE — Assessment & Plan Note (Signed)
 Improved.  Continue rizatriptan 10 mg PRN.

## 2023-05-23 ENCOUNTER — Encounter: Payer: Self-pay | Admitting: *Deleted

## 2023-06-01 ENCOUNTER — Other Ambulatory Visit: Payer: Self-pay | Admitting: Primary Care

## 2023-06-01 ENCOUNTER — Ambulatory Visit
Admission: RE | Admit: 2023-06-01 | Discharge: 2023-06-01 | Disposition: A | Discharge status: Home / Self Care | Payer: Self-pay | Source: Ambulatory Visit | Attending: Primary Care | Admitting: Primary Care

## 2023-06-01 DIAGNOSIS — G43909 Migraine, unspecified, not intractable, without status migrainosus: Secondary | ICD-10-CM

## 2023-06-01 DIAGNOSIS — R7303 Prediabetes: Secondary | ICD-10-CM

## 2023-06-01 DIAGNOSIS — Z8249 Family history of ischemic heart disease and other diseases of the circulatory system: Secondary | ICD-10-CM

## 2023-06-01 DIAGNOSIS — E785 Hyperlipidemia, unspecified: Secondary | ICD-10-CM

## 2023-06-07 ENCOUNTER — Other Ambulatory Visit: Payer: Self-pay | Admitting: Primary Care

## 2023-06-07 DIAGNOSIS — Z1231 Encounter for screening mammogram for malignant neoplasm of breast: Secondary | ICD-10-CM

## 2023-06-08 ENCOUNTER — Encounter

## 2023-06-08 DIAGNOSIS — Z1231 Encounter for screening mammogram for malignant neoplasm of breast: Secondary | ICD-10-CM

## 2023-06-16 ENCOUNTER — Encounter

## 2023-06-19 ENCOUNTER — Other Ambulatory Visit: Payer: Self-pay | Admitting: Primary Care

## 2023-06-19 DIAGNOSIS — F419 Anxiety disorder, unspecified: Secondary | ICD-10-CM

## 2023-07-14 ENCOUNTER — Other Ambulatory Visit: Payer: Self-pay | Admitting: Primary Care

## 2023-07-14 DIAGNOSIS — Z1231 Encounter for screening mammogram for malignant neoplasm of breast: Secondary | ICD-10-CM

## 2023-07-20 ENCOUNTER — Ambulatory Visit
Admission: RE | Admit: 2023-07-20 | Discharge: 2023-07-20 | Disposition: A | Discharge status: Home / Self Care | Source: Ambulatory Visit

## 2023-07-20 DIAGNOSIS — Z1231 Encounter for screening mammogram for malignant neoplasm of breast: Secondary | ICD-10-CM

## 2023-07-22 ENCOUNTER — Ambulatory Visit: Payer: Self-pay | Admitting: Primary Care

## 2023-08-02 ENCOUNTER — Other Ambulatory Visit: Payer: Self-pay | Admitting: Primary Care

## 2023-08-02 DIAGNOSIS — G43909 Migraine, unspecified, not intractable, without status migrainosus: Secondary | ICD-10-CM

## 2023-09-06 ENCOUNTER — Other Ambulatory Visit: Payer: Self-pay | Admitting: Primary Care

## 2023-09-06 DIAGNOSIS — G43909 Migraine, unspecified, not intractable, without status migrainosus: Secondary | ICD-10-CM

## 2023-09-10 ENCOUNTER — Ambulatory Visit
Admission: EM | Admit: 2023-09-10 | Discharge: 2023-09-10 | Disposition: A | Discharge status: Home / Self Care | Attending: Family Medicine | Admitting: Family Medicine

## 2023-09-10 DIAGNOSIS — K047 Periapical abscess without sinus: Secondary | ICD-10-CM | POA: Diagnosis not present

## 2023-09-10 DIAGNOSIS — M461 Sacroiliitis, not elsewhere classified: Secondary | ICD-10-CM | POA: Insufficient documentation

## 2023-09-10 MED ORDER — AMOXICILLIN-POT CLAVULANATE 875-125 MG PO TABS
1.0000 | ORAL_TABLET | Freq: Two times a day (BID) | ORAL | 0 refills | Status: DC
Start: 2023-09-10 — End: 2023-10-14

## 2023-09-10 MED ORDER — FLUCONAZOLE 150 MG PO TABS: 150.0000 mg | ORAL_TABLET | Freq: Once | ORAL | 0 refills | Status: AC

## 2023-09-10 NOTE — Discharge Instructions (Addendum)
 For pain take 800 mg ibuprofen  with 1000 mg of Tylenol  every 6-8 hours. You were prescribed an antibiotic. Please take this exactly as directed and do not stop taking it until the entire course of medicine is finished, even if you begin to feel better before finishing the course.  Schedule an appointment with your dentist or call local dentists to see if they take your insurance or can do a payment payment plan.  [facility] and [employer] [facility] are options.

## 2023-09-10 NOTE — ED Provider Notes (Signed)
 [facility]    CSN: [id number] Arrival date & time: 09/10/23  1004      History   Chief Complaint Chief Complaint  Patient presents with   Dental Pain    HPI Brittany Pham is a 58 y.o. female.   HPI  Brittany Pham presents for right lower dental pain that started yesterday.  She has some swelling in her tooth.  She called her dentist but they were closed.  Tried Dual-action and applied Anbesol without relief.  Mixed oil and coconut oil on the outside of her swollen jaw but that didn't help either. She is due to get dentures soon.       Past Medical History:  Diagnosis Date   Acute right ankle pain 01/10/2023   Arthritis    Chickenpox    Complication of anesthesia    nausea after septoplasty   Depression    GERD (gastroesophageal reflux disease)    Migraines    2-3 per month   PONV (postoperative nausea and vomiting)    Post-viral cough syndrome 05/16/2018    Patient Active Problem List   Diagnosis Date Noted   Bilateral sacroiliitis (HCC) 09/10/2023   Acute pain of left shoulder 05/20/2023   Chronic pain of right knee 05/02/2023   Chronic left-sided back pain 01/10/2023   Tobacco dependence 05/20/2022   OSA (obstructive sleep apnea) 05/01/2019   External hemorrhoid 05/01/2019   Encounter for screening colonoscopy    Polyp of ascending colon    Rectal polyp    Myopia of both eyes 05/09/2018   Presbyopia 05/09/2018   Genital herpes 09/19/2017   Frequent headaches 09/19/2017   Stress incontinence of urine 09/19/2017   Prediabetes 09/19/2017   Menopausal symptoms 09/19/2017   Migraines 09/22/2016   Chronic neck pain 09/22/2016   Anxiety and depression 09/22/2016   Encounter for annual general medical examination with abnormal findings in adult 09/22/2016    Past Surgical History:  Procedure Laterality Date   APPENDECTOMY     CERVICAL ABLATION  2007   CERVICAL SPINE SURGERY  2014   COLONOSCOPY WITH PROPOFOL  N/A 10/19/2018   Procedure:  COLONOSCOPY WITH BIOPSY;  Surgeon: Jinny Carmine, MD;  Location: [facility];  Service: Endoscopy;  Laterality: N/A;   perineal nerve decopression Right 04/2023   POLYPECTOMY N/A 10/19/2018   Procedure: POLYPECTOMY;  Surgeon: Jinny Carmine, MD;  Location: [facility];  Service: Endoscopy;  Laterality: N/A;   SEPTOPLASTY  1987   TUBAL LIGATION      OB History   No obstetric history on file.      Home Medications    Prior to Admission medications   Medication Sig Start Date End Date Taking? Authorizing Provider  amoxicillin -clavulanate (AUGMENTIN ) 875-125 MG tablet Take 1 tablet by mouth every 12 (twelve) hours. 09/10/23  Yes Fatim Vanderschaaf, DO  fluconazole  (DIFLUCAN ) 150 MG tablet Take 1 tablet (150 mg total) by mouth once for 1 dose. 09/10/23 09/10/23 Yes Oralia Criger, DO  acyclovir  (ZOVIRAX ) 400 MG tablet TAKE 1 TABLET BY MOUTH TWICE A DAY 04/03/21   Clark, Katherine K, NP  aspirin-acetaminophen -caffeine (EXCEDRIN MIGRAINE) 250-250-65 MG tablet Take 1 tablet by mouth every 6 (six) hours as needed for headache.    [provider]  DULoxetine  (CYMBALTA ) 30 MG capsule TAKE 1 CAPSULE (30 MG TOTAL) BY MOUTH DAILY. FOR ANXIETY AND DEPRESSION. TAKE WITH 60 MG DOSE. 06/20/23   Clark, Katherine K, NP  DULoxetine  (CYMBALTA ) 60 MG capsule TAKE 1 CAPSULE (60 MG TOTAL)  BY MOUTH DAILY. FOR ANXIETY AND DEPRESSION. TAKE WITH 30 MG DOSE. 06/20/23   Clark, Katherine K, NP  HYDROcodone-acetaminophen  (NORCO/VICODIN) 5-325 MG tablet Take 1 tablet by mouth every 6 (six) hours as needed for moderate pain (pain score 4-6).    [provider]  rizatriptan  (MAXALT ) 10 MG tablet TAKE 1 TABLET BY MOUTH AT MIGRAINE ONSET. MAY REPEAT WITH 1 TABLET IN 2 HOURS IF NEEDED. 08/02/23   Gretta Comer POUR, NP    Family History Family History  Problem Relation Age of Onset   Arthritis Mother    Breast cancer Mother 100   Heart disease Father    Heart attack Father 70       Deceased from MI    Arthritis Maternal Grandfather    Lung cancer Maternal Grandfather    Cancer Daughter    Cancer Son     Social History Social History   Tobacco Use   Smoking status: Every Day    Current packs/day: 1.00    Average packs/day: 1 pack/day for 19.0 years (19.0 ttl pk-yrs)    Types: Cigarettes   Smokeless tobacco: Never  Vaping Use   Vaping status: Never Used  Substance Use Topics   Alcohol use: Not Currently    Comment: rare   Drug use: No     Allergies   Azithromycin, Erythromycin, and Prednisone    Review of Systems Review of Systems : negative unless otherwise stated in HPI.      Physical Exam Triage Vital Signs ED Triage Vitals  Encounter Vitals Group     BP 09/10/23 1011 (!) 151/101     Girls Systolic BP Percentile --      Girls Diastolic BP Percentile --      Boys Systolic BP Percentile --      Boys Diastolic BP Percentile --      Pulse Rate 09/10/23 1011 (!) 102     Resp 09/10/23 1011 16     Temp 09/10/23 1011 98.3 F (36.8 C)     Temp Source 09/10/23 1011 Oral     SpO2 09/10/23 1011 95 %     Weight 09/10/23 1010 200 lb (90.7 kg)     Height 09/10/23 1010 5' 3 (1.6 m)     Head Circumference --      Peak Flow --      Pain Score 09/10/23 1016 8     Pain Loc --      Pain Education --      Exclude from Growth Chart --    No data found.  Updated Vital Signs BP (!) 151/101 (BP Location: Right Arm)   Pulse (!) 102   Temp 98.3 F (36.8 C) (Oral)   Resp 16   Ht 5' 3 (1.6 m)   Wt 90.7 kg   SpO2 95%   BMI 35.43 kg/m   Visual Acuity Right Eye Distance:   Left Eye Distance:   Bilateral Distance:    Right Eye Near:   Left Eye Near:    Bilateral Near:     Physical Exam  GEN:     alert, uncomfortable appearing female in no distress    HENT:  mucus membranes moist, oropharyngeal without lesions exudates or erythema, no nasal discharge, right lower 1st molar tender to percussion with gum erythema with an area of purulence but no fluctuance, no  trismus, no secretion pooling, no visible swelling of the floor the mouth, normal jaw movement without difficulty EYES:    no scleral  injection or discharge NECK:  normal ROM, no meningismus   RESP:  no increased work of breathing CVS:   regular rate  Skin:   warm and dry, no rash or skin changes of on external jaw    UC Treatments / Results  Labs (all labs ordered are listed, but only abnormal results are displayed) Labs Reviewed - No data to display  EKG   Radiology No results found.  Procedures Procedures (including critical care time)  Medications Ordered in UC Medications - No data to display  Initial Impression / Assessment and Plan / UC Course  I have reviewed the triage vital signs and the nursing notes.  Pertinent labs & imaging results that were available during my care of the patient were reviewed by me and considered in my medical decision making (see chart for details).     Pt is a 58 y.o. female who presents for 1 day of right lower dental pain with jaw swelling. Jaz is afebrile here without recent antipyretics. Satting well on room air. Overall pt is well appearing, well hydrated, without respiratory distress.  Dental exam concerning for dental abscess. Treat with antibiotics as below.  - continue Tylenol  with Motrin  as needed for discomfort - Gargle with salt water  several times a day - Follow up with her dentist on Monday  - Discussed  ED precautions, understanding voiced.   Discussed MDM, treatment plan and plan for follow-up with patient who agrees with plan.   Final Clinical Impressions(s) / UC Diagnoses   Final diagnoses:  Dental abscess     Discharge Instructions      For pain take 800 mg ibuprofen  with 1000 mg of Tylenol  every 6-8 hours. You were prescribed an antibiotic. Please take this exactly as directed and do not stop taking it until the entire course of medicine is finished, even if you begin to feel better before finishing the  course.  Schedule an appointment with your dentist or call local dentists to see if they take your insurance or can do a payment payment plan.  [facility] [employer] [facility] are options.      ED Prescriptions     Medication Sig Dispense Auth. Provider   amoxicillin -clavulanate (AUGMENTIN ) 875-125 MG tablet Take 1 tablet by mouth every 12 (twelve) hours. 14 tablet Nelli Swalley, DO   fluconazole  (DIFLUCAN ) 150 MG tablet Take 1 tablet (150 mg total) by mouth once for 1 dose. 1 tablet Kriste Berth, DO      PDMP not reviewed this encounter.   Kriste Berth, DO 09/10/23 1039

## 2023-09-10 NOTE — ED Triage Notes (Addendum)
 Pt c/o R sided dental pain x1 day. Has tried OTC meds w/o relief.

## 2023-10-14 ENCOUNTER — Ambulatory Visit (INDEPENDENT_AMBULATORY_CARE_PROVIDER_SITE_OTHER): Admitting: Primary Care

## 2023-10-14 ENCOUNTER — Encounter: Payer: Self-pay | Admitting: Primary Care

## 2023-10-14 VITALS — BP 146/92 | HR 104 | Temp 97.0°F | Ht 63.0 in | Wt 209.0 lb

## 2023-10-14 DIAGNOSIS — I10 Essential (primary) hypertension: Secondary | ICD-10-CM | POA: Insufficient documentation

## 2023-10-14 MED ORDER — VALSARTAN 80 MG PO TABS: 80.0000 mg | ORAL_TABLET | Freq: Every day | ORAL | 0 refills | Status: DC

## 2023-10-14 NOTE — Assessment & Plan Note (Signed)
 New diagnosis.  Given her blood pressure readings, coupled with mild aortic aneurysm and medical history, will treat. She will work on her diet, we discussed foods to avoid.  Start valsartan  80 mg daily.  Will plan to see her back in 2 weeks for blood pressure follow-up.

## 2023-10-14 NOTE — Progress Notes (Signed)
 Subjective:    Patient ID: Brittany Pham, female    DOB: [birth year], 58 y.o.   MRN: [id number]  HPI  Brittany Pham is a very pleasant 58 y.o. female with a history of OSA, migraines, prediabetes who presents today to discuss blood pressure readings.  She began checking her BP at home for the last 1 month and has noticed readings running 150-160/90-100. She denies a family history of hypertension, but her father did pass from a heart attack.  She's cut out soda, has increased water  intake and veggies. She underwent CT coronary calcium score in April 2025 which showed ascending aortic aneurysm measuring 3.9 X 4.1 cm.    BP Readings from Last 3 Encounters:  10/14/23 (!) 146/92  09/10/23 (!) 151/101  05/20/23 136/86      Review of Systems  Eyes:  Negative for visual disturbance.  Respiratory:  Negative for shortness of breath.   Cardiovascular:  Negative for chest pain.  Neurological:  Negative for dizziness and headaches.         Past Medical History:  Diagnosis Date   Acute right ankle pain 01/10/2023   Arthritis    Chickenpox    Complication of anesthesia    nausea after septoplasty   Depression    GERD (gastroesophageal reflux disease)    Migraines    2-3 per month   PONV (postoperative nausea and vomiting)    Post-viral cough syndrome 05/16/2018    Social History   Socioeconomic History   Marital status: Married    Spouse name: Not on file   Number of children: Not on file   Years of education: Not on file   Highest education level: Not on file  Occupational History   Not on file  Tobacco Use   Smoking status: Every Day    Current packs/day: 1.00    Average packs/day: 1 pack/day for 19.0 years (19.0 ttl pk-yrs)    Types: Cigarettes   Smokeless tobacco: Never  Vaping Use   Vaping status: Never Used  Substance and Sexual Activity   Alcohol use: Not Currently    Comment: rare   Drug use: No   Sexual activity: Yes    Birth control/protection:  Post-menopausal  Other Topics Concern   Not on file  Social History Narrative   Married.   2 children, 4 step children, 1 grandchild.    Works in [employer].    Enjoys swimming, spending time with family.    Social Drivers of Corporate investment banker Strain: Low Risk  (10/15/2020)   Received from [facility]   Overall Financial Resource Strain (CARDIA)    Difficulty of Paying Living Expenses: Not very hard  Food Insecurity: No Food Insecurity (10/15/2020)   Received from [facility]   Hunger Vital Sign    Within the past 12 months, you worried that your food would run out before you got the money to buy more.: Never true    Within the past 12 months, the food you bought just didn't last and you didn't have money to get more.: Never true  Transportation Needs: No Transportation Needs (10/15/2020)   Received from [facility]   PRAPARE - Transportation    Lack of Transportation (Medical): No    Lack of Transportation (Non-Medical): No  Physical Activity: Not on file  Stress: Not on file  Social Connections: Unknown (04/28/2022)   Received from [facility]   Social Network    Social Network: Not  on file  Intimate Partner Violence: Unknown (04/28/2022)   Received from [facility]   HITS    Physically Hurt: Not on file    Insult or Talk Down To: Not on file    Threaten Physical Harm: Not on file    Scream or Curse: Not on file    Past Surgical History:  Procedure Laterality Date   APPENDECTOMY     CERVICAL ABLATION  2007   CERVICAL SPINE SURGERY  2014   COLONOSCOPY WITH PROPOFOL  N/A 10/19/2018   Procedure: COLONOSCOPY WITH BIOPSY;  Surgeon: Jinny Carmine, MD;  Location: [facility];  Service: Endoscopy;  Laterality: N/A;   perineal nerve decopression Right 04/2023   POLYPECTOMY N/A 10/19/2018   Procedure: POLYPECTOMY;  Surgeon: Jinny Carmine, MD;  Location: [facility];  Service: Endoscopy;  Laterality: N/A;   SEPTOPLASTY  1987   TUBAL LIGATION       Family History  Problem Relation Age of Onset   Arthritis Mother    Breast cancer Mother 67   Heart disease Father    Heart attack Father 30       Deceased from MI   Arthritis Maternal Grandfather    Lung cancer Maternal Grandfather    Cancer Daughter    Cancer Son     Allergies  Allergen Reactions   Azithromycin Nausea And Vomiting   Erythromycin Nausea And Vomiting   Prednisone  Other (See Comments)    Passes out  syncope    Current Outpatient Medications on File Prior to Visit  Medication Sig Dispense Refill   acyclovir  (ZOVIRAX ) 400 MG tablet TAKE 1 TABLET BY MOUTH TWICE A DAY 180 tablet 0   aspirin-acetaminophen -caffeine (EXCEDRIN MIGRAINE) 250-250-65 MG tablet Take 1 tablet by mouth every 6 (six) hours as needed for headache.     DULoxetine  (CYMBALTA ) 30 MG capsule TAKE 1 CAPSULE (30 MG TOTAL) BY MOUTH DAILY. FOR ANXIETY AND DEPRESSION. TAKE WITH 60 MG DOSE. 90 capsule 2   DULoxetine  (CYMBALTA ) 60 MG capsule TAKE 1 CAPSULE (60 MG TOTAL) BY MOUTH DAILY. FOR ANXIETY AND DEPRESSION. TAKE WITH 30 MG DOSE. 90 capsule 2   rizatriptan  (MAXALT ) 10 MG tablet TAKE 1 TABLET BY MOUTH AT MIGRAINE ONSET. MAY REPEAT WITH 1 TABLET IN 2 HOURS IF NEEDED. 10 tablet 0   No current facility-administered medications on file prior to visit.    BP (!) 146/92   Pulse (!) 104   Temp (!) 97 F (36.1 C) (Temporal)   Ht 5' 3 (1.6 m)   Wt 209 lb (94.8 kg)   SpO2 98%   BMI 37.02 kg/m  Objective:   Physical Exam Cardiovascular:     Rate and Rhythm: Normal rate and regular rhythm.  Pulmonary:     Effort: Pulmonary effort is normal.     Breath sounds: Normal breath sounds.  Musculoskeletal:     Cervical back: Neck supple.  Skin:    General: Skin is warm and dry.  Neurological:     Mental Status: She is alert and oriented to person, place, and time.  Psychiatric:        Mood and Affect: Mood normal.           Assessment & Plan:  Essential hypertension Assessment &  Plan: New diagnosis.  Given her blood pressure readings, coupled with mild aortic aneurysm and medical history, will treat. She will work on her diet, we discussed foods to avoid.  Start valsartan  80 mg daily.  Will plan to see  her back in 2 weeks for blood pressure follow-up.  Orders: -     Valsartan ; Take 1 tablet (80 mg total) by mouth daily. for blood pressure.  Dispense: 30 tablet; Refill: 0        Comer MARLA Gaskins, NP

## 2023-10-14 NOTE — Patient Instructions (Addendum)
 Start valsartan  80 mg once daily for blood pressure.  Continue to monitor your blood pressure at home.  Please schedule a follow up visit to meet back with me in 2-3 weeks for blood pressure check.   It was a pleasure to see you today!

## 2023-11-04 ENCOUNTER — Encounter: Payer: Self-pay | Admitting: Primary Care

## 2023-11-04 ENCOUNTER — Ambulatory Visit (INDEPENDENT_AMBULATORY_CARE_PROVIDER_SITE_OTHER): Admitting: Primary Care

## 2023-11-04 VITALS — BP 152/98 | HR 92 | Temp 97.7°F | Ht 63.0 in | Wt 213.0 lb

## 2023-11-04 DIAGNOSIS — I10 Essential (primary) hypertension: Secondary | ICD-10-CM | POA: Diagnosis not present

## 2023-11-04 NOTE — Patient Instructions (Signed)
 Start valsartan  80 mg daily for blood pressure.  Please schedule a follow up visit to meet back with me in 2-3 weeks for blood pressure check.   It was a pleasure to see you today!

## 2023-11-04 NOTE — Progress Notes (Signed)
 Subjective:    Patient ID: Brittany Pham, female    DOB: Oct 04, 1965, 58 y.o.   MRN: [id number]  Brittany Pham is a very pleasant 58 y.o. female with a history of hypertension, migraines, OSA, anxiety, tobacco use who presents today for follow-up of hypertension.  She was last evaluated on 10/14/2023 for elevated blood pressure readings without diagnosis of hypertension.  Readings were above goal at home and also during multiple prior office visits.  During this visit she was initiated on valsartan  80 mg daily.  She is here for follow-up today.  Since her last visit she has yet to begin taking valsartan  80 mg due to a recent family death.  She is checking her blood pressure at home which is running in the 160s over 90s.  BP Readings from Last 3 Encounters:  11/04/23 (!) 152/98  10/14/23 (!) 146/92  09/10/23 (!) 151/101     Review of Systems  Constitutional:  Positive for fatigue.  Respiratory:  Negative for shortness of breath.   Cardiovascular:  Negative for chest pain.  Neurological:  Positive for headaches.         Past Medical History:  Diagnosis Date   Acute right ankle pain 01/10/2023   Arthritis    Chickenpox    Complication of anesthesia    nausea after septoplasty   Depression    GERD (gastroesophageal reflux disease)    Migraines    2-3 per month   PONV (postoperative nausea and vomiting)    Post-viral cough syndrome 05/16/2018    Social History   Socioeconomic History   Marital status: Married    Spouse name: Not on file   Number of children: Not on file   Years of education: Not on file   Highest education level: Not on file  Occupational History   Not on file  Tobacco Use   Smoking status: Every Day    Current packs/day: 1.00    Average packs/day: 1 pack/day for 19.0 years (19.0 ttl pk-yrs)    Types: Cigarettes   Smokeless tobacco: Never  Vaping Use   Vaping status: Never Used  Substance and Sexual Activity   Alcohol use: Not Currently     Comment: rare   Drug use: No   Sexual activity: Yes    Birth control/protection: Post-menopausal  Other Topics Concern   Not on file  Social History Narrative   Married.   2 children, 4 step children, 1 grandchild.    Works in [employer].    Enjoys swimming, spending time with family.    Social Drivers of Corporate investment banker Strain: Low Risk  (10/15/2020)   Received from [facility]   Overall Financial Resource Strain (CARDIA)    Difficulty of Paying Living Expenses: Not very hard  Food Insecurity: No Food Insecurity (10/15/2020)   Received from [facility]   Hunger Vital Sign    Within the past 12 months, you worried that your food would run out before you got the money to buy more.: Never true    Within the past 12 months, the food you bought just didn't last and you didn't have money to get more.: Never true  Transportation Needs: No Transportation Needs (10/15/2020)   Received from [facility]   PRAPARE - Transportation    Lack of Transportation (Medical): No    Lack of Transportation (Non-Medical): No  Physical Activity: Not on file  Stress: Not on file  Social Connections: Unknown (04/28/2022)  Received from [facility]   Social Network    Social Network: Not on file  Intimate Partner Violence: Unknown (04/28/2022)   Received from [facility]   HITS    Physically Hurt: Not on file    Insult or Talk Down To: Not on file    Threaten Physical Harm: Not on file    Scream or Curse: Not on file    Past Surgical History:  Procedure Laterality Date   APPENDECTOMY     CERVICAL ABLATION  2007   CERVICAL SPINE SURGERY  2014   COLONOSCOPY WITH PROPOFOL  N/A 10/19/2018   Procedure: COLONOSCOPY WITH BIOPSY;  Surgeon: Jinny Carmine, MD;  Location: [facility];  Service: Endoscopy;  Laterality: N/A;   perineal nerve decopression Right 04/2023   POLYPECTOMY N/A 10/19/2018   Procedure: POLYPECTOMY;  Surgeon: Jinny Carmine, MD;  Location: [facility];  Service: Endoscopy;  Laterality: N/A;   SEPTOPLASTY  1987   TUBAL LIGATION      Family History  Problem Relation Age of Onset   Arthritis Mother    Breast cancer Mother 71   Heart disease Father    Heart attack Father 55       Deceased from MI   Arthritis Maternal Grandfather    Lung cancer Maternal Grandfather    Cancer Daughter    Cancer Son     Allergies  Allergen Reactions   Azithromycin Nausea And Vomiting   Erythromycin Nausea And Vomiting   Prednisone  Other (See Comments)    Passes out  syncope    Current Outpatient Medications on File Prior to Visit  Medication Sig Dispense Refill   acyclovir  (ZOVIRAX ) 400 MG tablet TAKE 1 TABLET BY MOUTH TWICE A DAY 180 tablet 0   aspirin-acetaminophen -caffeine (EXCEDRIN MIGRAINE) 250-250-65 MG tablet Take 1 tablet by mouth every 6 (six) hours as needed for headache.     DULoxetine  (CYMBALTA ) 30 MG capsule TAKE 1 CAPSULE (30 MG TOTAL) BY MOUTH DAILY. FOR ANXIETY AND DEPRESSION. TAKE WITH 60 MG DOSE. 90 capsule 2   DULoxetine  (CYMBALTA ) 60 MG capsule TAKE 1 CAPSULE (60 MG TOTAL) BY MOUTH DAILY. FOR ANXIETY AND DEPRESSION. TAKE WITH 30 MG DOSE. 90 capsule 2   rizatriptan  (MAXALT ) 10 MG tablet TAKE 1 TABLET BY MOUTH AT MIGRAINE ONSET. MAY REPEAT WITH 1 TABLET IN 2 HOURS IF NEEDED. 10 tablet 0   valsartan  (DIOVAN ) 80 MG tablet Take 1 tablet (80 mg total) by mouth daily. for blood pressure. (Patient not taking: Reported on 11/04/2023) 30 tablet 0   No current facility-administered medications on file prior to visit.    BP (!) 152/98   Pulse 92   Temp 97.7 F (36.5 C) (Temporal)   Ht 5' 3 (1.6 m)   Wt 213 lb (96.6 kg)   SpO2 98%   BMI 37.73 kg/m  Objective:   Physical Exam Cardiovascular:     Rate and Rhythm: Normal rate and regular rhythm.  Pulmonary:     Effort: Pulmonary effort is normal.     Breath sounds: Normal breath sounds.  Musculoskeletal:     Cervical back: Neck supple.  Skin:    General: Skin is warm and  dry.  Neurological:     Mental Status: She is alert and oriented to person, place, and time.  Psychiatric:        Mood and Affect: Mood normal.     Physical Exam        Assessment & Plan:  Essential hypertension  Assessment & Plan: Uncontrolled.  Discussed the importance of starting valsartan  80 mg as prescribed.  We will plan to see her back in 2-3 weeks for BP check and BMP.      Assessment and Plan Assessment & Plan         Comer MARLA Gaskins, NP     History of Present Illness

## 2023-11-04 NOTE — Assessment & Plan Note (Signed)
 Uncontrolled.  Discussed the importance of starting valsartan  80 mg as prescribed.  We will plan to see her back in 2-3 weeks for BP check and BMP.

## 2023-11-04 NOTE — Assessment & Plan Note (Deleted)
 Uncontrolled.  Discussed the importance of starting valsartan  80 mg as prescribed.  We will plan to see her back in 2-3 weeks for BP check and BMP.

## 2023-11-07 ENCOUNTER — Other Ambulatory Visit: Payer: Self-pay | Admitting: Primary Care

## 2023-11-07 DIAGNOSIS — I10 Essential (primary) hypertension: Secondary | ICD-10-CM

## 2023-11-18 ENCOUNTER — Ambulatory Visit (INDEPENDENT_AMBULATORY_CARE_PROVIDER_SITE_OTHER): Admitting: Primary Care

## 2023-11-18 ENCOUNTER — Encounter: Payer: Self-pay | Admitting: Primary Care

## 2023-11-18 VITALS — BP 168/100 | HR 116 | Temp 97.5°F | Ht 63.0 in | Wt 213.0 lb

## 2023-11-18 DIAGNOSIS — F419 Anxiety disorder, unspecified: Secondary | ICD-10-CM

## 2023-11-18 DIAGNOSIS — F32A Depression, unspecified: Secondary | ICD-10-CM | POA: Diagnosis not present

## 2023-11-18 DIAGNOSIS — I10 Essential (primary) hypertension: Secondary | ICD-10-CM | POA: Diagnosis not present

## 2023-11-18 MED ORDER — HYDROCHLOROTHIAZIDE 25 MG PO TABS: 25.0000 mg | ORAL_TABLET | Freq: Every day | ORAL | 0 refills | Status: DC

## 2023-11-18 NOTE — Progress Notes (Signed)
 Subjective:    Patient ID: Brittany Pham, female    DOB: Jan 13, 1966, 58 y.o.   MRN: [id number]  Brittany Pham is a very pleasant 58 y.o. female with a history of hypertension, migraines, OSA, headaches, anxiety who presents today for follow-up of hypertension and to discuss depression.   1) Hypertension: She was last evaluated on 11/04/2023 for follow-up of hypertension.  During this visit she admitted that she had yet to start her valsartan  80 mg prescription.  She is instructed to start valsartan  80 mg daily and follow-up 2 weeks later.  Since her last visit, she is compliant to valsartan  80 mg daily. She's checking her BP at home which running 150-160/90s. She denies swelling in her lower extremities. Her headaches have improved some.   BP Readings from Last 3 Encounters:  11/18/23 (!) 168/100  11/04/23 (!) 152/98  10/14/23 (!) 146/92   2) Depression: Chronic history of depression and anxiety. Over the last few months she's been under increased family stress. Symptoms include tearfulness, feeling down, little motivation to do things. She is managed on duloxetine  90 mg daily which has historically been effective.  She would like to meet with a therapist.   Review of Systems  Respiratory:  Negative for shortness of breath.   Cardiovascular:  Negative for chest pain.  Neurological:  Positive for headaches. Negative for dizziness.         Past Medical History:  Diagnosis Date   Acute right ankle pain 01/10/2023   Arthritis    Chickenpox    Complication of anesthesia    nausea after septoplasty   Depression    GERD (gastroesophageal reflux disease)    Migraines    2-3 per month   PONV (postoperative nausea and vomiting)    Post-viral cough syndrome 05/16/2018    Social History   Socioeconomic History   Marital status: Married    Spouse name: Not on file   Number of children: Not on file   Years of education: Not on file   Highest education level: Not on file   Occupational History   Not on file  Tobacco Use   Smoking status: Every Day    Current packs/day: 1.00    Average packs/day: 1 pack/day for 19.0 years (19.0 ttl pk-yrs)    Types: Cigarettes   Smokeless tobacco: Never  Vaping Use   Vaping status: Never Used  Substance and Sexual Activity   Alcohol use: Not Currently    Comment: rare   Drug use: No   Sexual activity: Yes    Birth control/protection: Post-menopausal  Other Topics Concern   Not on file  Social History Narrative   Married.   2 children, 4 step children, 1 grandchild.    Works in [employer].    Enjoys swimming, spending time with family.    Social Drivers of Corporate investment banker Strain: Low Risk  (10/15/2020)   Received from [facility]   Overall Financial Resource Strain (CARDIA)    Difficulty of Paying Living Expenses: Not very hard  Food Insecurity: No Food Insecurity (10/15/2020)   Received from [facility]   Hunger Vital Sign    Within the past 12 months, you worried that your food would run out before you got the money to buy more.: Never true    Within the past 12 months, the food you bought just didn't last and you didn't have money to get more.: Never true  Transportation Needs: No Transportation  Needs (10/15/2020)   Received from [facility] - Transportation    Lack of Transportation (Medical): No    Lack of Transportation (Non-Medical): No  Physical Activity: Not on file  Stress: Not on file  Social Connections: Unknown (04/28/2022)   Received from [facility]   Social Network    Social Network: Not on file  Intimate Partner Violence: Unknown (04/28/2022)   Received from [facility]   HITS    Physically Hurt: Not on file    Insult or Talk Down To: Not on file    Threaten Physical Harm: Not on file    Scream or Curse: Not on file    Past Surgical History:  Procedure Laterality Date   APPENDECTOMY     CERVICAL ABLATION  2007   CERVICAL SPINE SURGERY  2014    COLONOSCOPY WITH PROPOFOL  N/A 10/19/2018   Procedure: COLONOSCOPY WITH BIOPSY;  Surgeon: Jinny Carmine, MD;  Location: [facility];  Service: Endoscopy;  Laterality: N/A;   perineal nerve decopression Right 04/2023   POLYPECTOMY N/A 10/19/2018   Procedure: POLYPECTOMY;  Surgeon: Jinny Carmine, MD;  Location: [facility];  Service: Endoscopy;  Laterality: N/A;   SEPTOPLASTY  1987   TUBAL LIGATION      Family History  Problem Relation Age of Onset   Arthritis Mother    Breast cancer Mother 26   Heart disease Father    Heart attack Father 33       Deceased from MI   Arthritis Maternal Grandfather    Lung cancer Maternal Grandfather    Cancer Daughter    Cancer Son     Allergies  Allergen Reactions   Azithromycin Nausea And Vomiting   Erythromycin Nausea And Vomiting   Prednisone  Other (See Comments)    Passes out  syncope    Current Outpatient Medications on File Prior to Visit  Medication Sig Dispense Refill   acyclovir  (ZOVIRAX ) 400 MG tablet TAKE 1 TABLET BY MOUTH TWICE A DAY 180 tablet 0   aspirin-acetaminophen -caffeine (EXCEDRIN MIGRAINE) 250-250-65 MG tablet Take 1 tablet by mouth every 6 (six) hours as needed for headache.     DULoxetine  (CYMBALTA ) 30 MG capsule TAKE 1 CAPSULE (30 MG TOTAL) BY MOUTH DAILY. FOR ANXIETY AND DEPRESSION. TAKE WITH 60 MG DOSE. 90 capsule 2   DULoxetine  (CYMBALTA ) 60 MG capsule TAKE 1 CAPSULE (60 MG TOTAL) BY MOUTH DAILY. FOR ANXIETY AND DEPRESSION. TAKE WITH 30 MG DOSE. 90 capsule 2   rizatriptan  (MAXALT ) 10 MG tablet TAKE 1 TABLET BY MOUTH AT MIGRAINE ONSET. MAY REPEAT WITH 1 TABLET IN 2 HOURS IF NEEDED. 10 tablet 0   No current facility-administered medications on file prior to visit.    BP (!) 168/100   Pulse (!) 116   Temp (!) 97.5 F (36.4 C) (Temporal)   Ht 5' 3 (1.6 m)   Wt 213 lb (96.6 kg)   SpO2 96%   BMI 37.73 kg/m  Objective:   Physical Exam Cardiovascular:     Rate and Rhythm: Normal rate and regular  rhythm.  Pulmonary:     Effort: Pulmonary effort is normal.     Breath sounds: Normal breath sounds.  Musculoskeletal:     Cervical back: Neck supple.  Skin:    General: Skin is warm and dry.  Neurological:     Mental Status: She is alert and oriented to person, place, and time.  Psychiatric:        Mood and  Affect: Mood normal.     Physical Exam        Assessment & Plan:  Essential hypertension Assessment & Plan: Remains uncontrolled, worse than last visit.  Discontinue valsartan  80 mg daily. Start hydrochlorothiazide  25 mg once daily.  Will plan to see her back in the office in 2 to 3 weeks for blood pressure check and BMP.  Orders: -     hydroCHLOROthiazide ; Take 1 tablet (25 mg total) by mouth daily.  Dispense: 30 tablet; Refill: 0  Anxiety and depression Assessment & Plan: Referral placed for therapy.  Orders: -     Ambulatory referral to Psychology    Assessment and Plan Assessment & Plan         Comer MARLA Gaskins, NP   History of Present Illness

## 2023-11-18 NOTE — Patient Instructions (Signed)
 You will either be contacted via phone regarding your referral to therapy, or you may receive a letter on your MyChart portal from our referral team with instructions for scheduling an appointment. Please let us  know if you have not been contacted by anyone within two weeks.  Stop taking valsartan  for blood pressure.  Start taking hydrochlorothiazide  25 mg once daily for blood pressure.  Please schedule a follow up visit to meet back with me in 2-3 weeks for blood pressure check.   It was a pleasure to see you today!

## 2023-11-18 NOTE — Assessment & Plan Note (Signed)
 Remains uncontrolled, worse than last visit.  Discontinue valsartan  80 mg daily. Start hydrochlorothiazide  25 mg once daily.  Will plan to see her back in the office in 2 to 3 weeks for blood pressure check and BMP.

## 2023-11-18 NOTE — Assessment & Plan Note (Signed)
 Referral placed for therapy.

## 2023-12-09 ENCOUNTER — Encounter: Payer: Self-pay | Admitting: Primary Care

## 2023-12-09 ENCOUNTER — Ambulatory Visit: Payer: Self-pay | Admitting: Primary Care

## 2023-12-09 ENCOUNTER — Ambulatory Visit: Admitting: Primary Care

## 2023-12-09 VITALS — BP 112/74 | HR 88 | Temp 98.6°F | Ht 63.0 in | Wt 213.0 lb

## 2023-12-09 DIAGNOSIS — I10 Essential (primary) hypertension: Secondary | ICD-10-CM

## 2023-12-09 LAB — BASIC METABOLIC PANEL WITH GFR
BUN: 11 mg/dL (ref 6–23)
CO2: 29 meq/L (ref 19–32)
Calcium: 9.2 mg/dL (ref 8.4–10.5)
Chloride: 101 meq/L (ref 96–112)
Creatinine, Ser: 0.88 mg/dL (ref 0.40–1.20)
GFR: 73.41 mL/min (ref >60.00)
Glucose, Bld: 96 mg/dL (ref 70–99)
Potassium: 4.2 meq/L (ref 3.5–5.1)
Sodium: 138 meq/L (ref 135–145)

## 2023-12-09 NOTE — Assessment & Plan Note (Signed)
Controlled.  Continue hydrochlorothiazide 25 mg daily. BMP pending.

## 2023-12-09 NOTE — Progress Notes (Signed)
 Subjective:    Patient ID: Brittany Pham, female    DOB: [birth year], 58 y.o.   MRN: [id number]  Brittany Pham is a very pleasant 58 y.o. female with a history of hypertension, OSA, prediabetes who presents today for follow-up of hypertension.  She was last evaluated on 11/18/2023 for follow-up of elevated blood pressure readings on valsartan  80 mg.  During this visit she was compliant to valsartan  but BP readings were worse. Valsartan  was discontinued and hydrochlorothiazide  25 mg was initiated.  Since her last visit she is compliant to hydrochlorothiazide  25 mg daily. She initially noticed dizziness in the beginning which has improved. She denies chest pain. She's had a few headaches for which she attributes to seasonal changes.   BP Readings from Last 3 Encounters:  12/09/23 112/74  11/18/23 (!) 168/100  11/04/23 (!) 152/98      Review of Systems  Respiratory:  Negative for shortness of breath.   Cardiovascular:  Negative for chest pain.  Neurological:  Positive for headaches. Negative for dizziness.         Past Medical History:  Diagnosis Date   Acute right ankle pain 01/10/2023   Arthritis    Chickenpox    Complication of anesthesia    nausea after septoplasty   Depression    GERD (gastroesophageal reflux disease)    Migraines    2-3 per month   PONV (postoperative nausea and vomiting)    Post-viral cough syndrome 05/16/2018    Social History   Socioeconomic History   Marital status: Married    Spouse name: Not on file   Number of children: Not on file   Years of education: Not on file   Highest education level: Not on file  Occupational History   Not on file  Tobacco Use   Smoking status: Every Day    Current packs/day: 1.00    Average packs/day: 1 pack/day for 19.0 years (19.0 ttl pk-yrs)    Types: Cigarettes   Smokeless tobacco: Never  Vaping Use   Vaping status: Never Used  Substance and Sexual Activity   Alcohol use: Not Currently     Comment: rare   Drug use: No   Sexual activity: Yes    Birth control/protection: Post-menopausal  Other Topics Concern   Not on file  Social History Narrative   Married.   2 children, 4 step children, 1 grandchild.    Works in [employer].    Enjoys swimming, spending time with family.    Social Drivers of Corporate investment banker Strain: Low Risk  (10/15/2020)   Received from [facility]   Overall Financial Resource Strain (CARDIA)    Difficulty of Paying Living Expenses: Not very hard  Food Insecurity: No Food Insecurity (10/15/2020)   Received from [facility]   Hunger Vital Sign    Within the past 12 months, you worried that your food would run out before you got the money to buy more.: Never true    Within the past 12 months, the food you bought just didn't last and you didn't have money to get more.: Never true  Transportation Needs: No Transportation Needs (10/15/2020)   Received from [facility]   PRAPARE - Transportation    Lack of Transportation (Medical): No    Lack of Transportation (Non-Medical): No  Physical Activity: Not on file  Stress: Not on file  Social Connections: Unknown (04/28/2022)   Received from [facility]   Social Network  Social Network: Not on file  Intimate Partner Violence: Unknown (04/28/2022)   Received from [facility]   HITS    Physically Hurt: Not on file    Insult or Talk Down To: Not on file    Threaten Physical Harm: Not on file    Scream or Curse: Not on file    Past Surgical History:  Procedure Laterality Date   APPENDECTOMY     CERVICAL ABLATION  2007   CERVICAL SPINE SURGERY  2014   COLONOSCOPY WITH PROPOFOL  N/A 10/19/2018   Procedure: COLONOSCOPY WITH BIOPSY;  Surgeon: Jinny Carmine, MD;  Location: [facility];  Service: Endoscopy;  Laterality: N/A;   perineal nerve decopression Right 04/2023   POLYPECTOMY N/A 10/19/2018   Procedure: POLYPECTOMY;  Surgeon: Jinny Carmine, MD;  Location: [facility];  Service: Endoscopy;  Laterality: N/A;   SEPTOPLASTY  1987   TUBAL LIGATION      Family History  Problem Relation Age of Onset   Arthritis Mother    Breast cancer Mother 32   Heart disease Father    Heart attack Father 49       Deceased from MI   Arthritis Maternal Grandfather    Lung cancer Maternal Grandfather    Cancer Daughter    Cancer Son     Allergies  Allergen Reactions   Azithromycin Nausea And Vomiting   Erythromycin Nausea And Vomiting   Prednisone  Other (See Comments)    Passes out  syncope    Current Outpatient Medications on File Prior to Visit  Medication Sig Dispense Refill   acyclovir  (ZOVIRAX ) 400 MG tablet TAKE 1 TABLET BY MOUTH TWICE A DAY 180 tablet 0   aspirin-acetaminophen -caffeine (EXCEDRIN MIGRAINE) 250-250-65 MG tablet Take 1 tablet by mouth every 6 (six) hours as needed for headache.     DULoxetine  (CYMBALTA ) 30 MG capsule TAKE 1 CAPSULE (30 MG TOTAL) BY MOUTH DAILY. FOR ANXIETY AND DEPRESSION. TAKE WITH 60 MG DOSE. 90 capsule 2   DULoxetine  (CYMBALTA ) 60 MG capsule TAKE 1 CAPSULE (60 MG TOTAL) BY MOUTH DAILY. FOR ANXIETY AND DEPRESSION. TAKE WITH 30 MG DOSE. 90 capsule 2   hydrochlorothiazide  (HYDRODIURIL ) 25 MG tablet Take 1 tablet (25 mg total) by mouth daily. 30 tablet 0   rizatriptan  (MAXALT ) 10 MG tablet TAKE 1 TABLET BY MOUTH AT MIGRAINE ONSET. MAY REPEAT WITH 1 TABLET IN 2 HOURS IF NEEDED. 10 tablet 0   No current facility-administered medications on file prior to visit.    BP 112/74   Pulse 88   Temp 98.6 F (37 C) (Oral)   Ht 5' 3 (1.6 m)   Wt 213 lb (96.6 kg)   SpO2 97%   BMI 37.73 kg/m  Objective:   Physical Exam Cardiovascular:     Rate and Rhythm: Normal rate and regular rhythm.  Pulmonary:     Effort: Pulmonary effort is normal.     Breath sounds: Normal breath sounds.  Musculoskeletal:     Cervical back: Neck supple.  Skin:    General: Skin is warm and dry.  Neurological:     Mental Status: She is alert and  oriented to person, place, and time.  Psychiatric:        Mood and Affect: Mood normal.     Physical Exam        Assessment & Plan:  Essential hypertension Assessment & Plan: Controlled.  Continue hydrochlorothiazide  25 mg daily. BMP pending.  Orders: -     Basic metabolic  panel with GFR    Assessment and Plan Assessment & Plan         Comer MARLA Gaskins, NP    History of Present Illness

## 2023-12-09 NOTE — Patient Instructions (Signed)
 Schedule a 40 minute visit for the mole removal.   Stop by the lab prior to leaving today. I will notify you of your results once received.   It was a pleasure to see you today!

## 2023-12-13 ENCOUNTER — Other Ambulatory Visit: Payer: Self-pay | Admitting: Primary Care

## 2023-12-13 DIAGNOSIS — I10 Essential (primary) hypertension: Secondary | ICD-10-CM

## 2023-12-19 ENCOUNTER — Other Ambulatory Visit (HOSPITAL_COMMUNITY)
Admission: RE | Admit: 2023-12-19 | Discharge: 2023-12-19 | Disposition: A | Discharge status: Home / Self Care | Source: Ambulatory Visit | Attending: Primary Care | Admitting: Primary Care

## 2023-12-19 ENCOUNTER — Ambulatory Visit (INDEPENDENT_AMBULATORY_CARE_PROVIDER_SITE_OTHER): Admitting: Primary Care

## 2023-12-19 ENCOUNTER — Encounter: Payer: Self-pay | Admitting: Primary Care

## 2023-12-19 VITALS — BP 128/76 | HR 102 | Temp 97.8°F | Ht 63.0 in | Wt 211.0 lb

## 2023-12-19 DIAGNOSIS — D229 Melanocytic nevi, unspecified: Secondary | ICD-10-CM | POA: Insufficient documentation

## 2023-12-19 NOTE — Assessment & Plan Note (Signed)
 Appears benign, however, given her smoking history and sun exposure history, we will send off for pathology testing. She agrees.  Written consent obtained. Site cleansed with Betadine solution. Lidocaine  2% with epi was used for analgesia. Nevus was removed with derma blade. Nevus was placed and pathology testing. Silver nitrate sticks used for bleeding cessation. Dressing applied. Patient tolerated well.  Home instructions provided.

## 2023-12-19 NOTE — Progress Notes (Signed)
 Subjective:    Patient ID: Brittany Pham, female    DOB: 08-05-1965, 58 y.o.   MRN: [id number]  Brittany Pham is a very pleasant 58 y.o. female with a history of migraines, hypertension, OSA, genital herpes, tobacco use who presents today for removal of nevus.  The nevus is located to her right lateral anterior mid thigh for which she noticed about 1 year ago. The nevus has grown in size and become scaly. She admits picking at the nevus intermittently. She is a current smoker. Worked as an Set designer for 3 years. She would like the nevus removed.    Review of Systems  Constitutional:  Negative for unexpected weight change.  Skin:  Negative for color change and rash.       Nevus         Past Medical History:  Diagnosis Date   Acute right ankle pain 01/10/2023   Arthritis    Chickenpox    Complication of anesthesia    nausea after septoplasty   Depression    GERD (gastroesophageal reflux disease)    Migraines    2-3 per month   PONV (postoperative nausea and vomiting)    Post-viral cough syndrome 05/16/2018    Social History   Socioeconomic History   Marital status: Married    Spouse name: Not on file   Number of children: Not on file   Years of education: Not on file   Highest education level: Not on file  Occupational History   Not on file  Tobacco Use   Smoking status: Every Day    Current packs/day: 1.00    Average packs/day: 1 pack/day for 19.0 years (19.0 ttl pk-yrs)    Types: Cigarettes   Smokeless tobacco: Never  Vaping Use   Vaping status: Never Used  Substance and Sexual Activity   Alcohol use: Not Currently    Comment: rare   Drug use: No   Sexual activity: Yes    Birth control/protection: Post-menopausal  Other Topics Concern   Not on file  Social History Narrative   Married.   2 children, 4 step children, 1 grandchild.    Works in [employer].    Enjoys swimming, spending time with family.    Social Drivers of Research scientist (physical sciences) Strain: Low Risk  (10/15/2020)   Received [facility]   Overall Financial Resource Strain (CARDIA)    Difficulty of Paying Living Expenses: Not very hard  Food Insecurity: No Food Insecurity (10/15/2020)   Received from [facility]   Hunger Vital Sign    Within the past 12 months, you worried that your food would run out before you got the money to buy more.: Never true    Within the past 12 months, the food you bought just didn't last and you didn't have money to get more.: Never true  Transportation Needs: No Transportation Needs (10/15/2020)   Received from [facility]   PRAPARE - Transportation    Lack of Transportation (Medical): No    Lack of Transportation (Non-Medical): No  Physical Activity: Not on file  Stress: Not on file  Social Connections: Unknown (04/28/2022)   Received from [facility]   Social Network    Social Network: Not on file  Intimate Partner Violence: Unknown (04/28/2022)   Received [facility]   HITS    Physically Hurt: Not on file    Insult or Talk Down To: Not on file  Threaten Physical Harm: Not on file    Scream or Curse: Not on file    Past Surgical History:  Procedure Laterality Date   APPENDECTOMY     CERVICAL ABLATION  2007   CERVICAL SPINE SURGERY  2014   COLONOSCOPY WITH PROPOFOL  N/A 10/19/2018   Procedure: COLONOSCOPY WITH BIOPSY;  Surgeon: Jinny Carmine, MD;  Location: [facility];  Service: Endoscopy;  Laterality: N/A;   perineal nerve decopression Right 04/2023   POLYPECTOMY N/A 10/19/2018   Procedure: POLYPECTOMY;  Surgeon: Jinny Carmine, MD;  Location: [facility];  Service: Endoscopy;  Laterality: N/A;   SEPTOPLASTY  1987   TUBAL LIGATION      Family History  Problem Relation Age of Onset   Arthritis Mother    Breast cancer Mother 71   Heart disease Father    Heart attack Father 65       Deceased from MI   Arthritis Maternal Grandfather    Lung cancer Maternal Grandfather     Cancer Daughter    Cancer Son     Allergies  Allergen Reactions   Azithromycin Nausea And Vomiting   Erythromycin Nausea And Vomiting   Prednisone  Other (See Comments)    Passes out  syncope    Current Outpatient Medications on File Prior to Visit  Medication Sig Dispense Refill   acyclovir  (ZOVIRAX ) 400 MG tablet TAKE 1 TABLET BY MOUTH TWICE A DAY 180 tablet 0   aspirin-acetaminophen -caffeine (EXCEDRIN MIGRAINE) 250-250-65 MG tablet Take 1 tablet by mouth every 6 (six) hours as needed for headache.     DULoxetine  (CYMBALTA ) 30 MG capsule TAKE 1 CAPSULE (30 MG TOTAL) BY MOUTH DAILY. FOR ANXIETY AND DEPRESSION. TAKE WITH 60 MG DOSE. 90 capsule 2   DULoxetine  (CYMBALTA ) 60 MG capsule TAKE 1 CAPSULE (60 MG TOTAL) BY MOUTH DAILY. FOR ANXIETY AND DEPRESSION. TAKE WITH 30 MG DOSE. 90 capsule 2   hydrochlorothiazide  (HYDRODIURIL ) 25 MG tablet Take 1 tablet (25 mg total) by mouth daily. for blood pressure. 90 tablet 1   rizatriptan  (MAXALT ) 10 MG tablet TAKE 1 TABLET BY MOUTH AT MIGRAINE ONSET. MAY REPEAT WITH 1 TABLET IN 2 HOURS IF NEEDED. 10 tablet 0   No current facility-administered medications on file prior to visit.    BP 128/76   Pulse (!) 102   Temp 97.8 F (36.6 C) (Temporal)   Ht 5' 3 (1.6 m)   Wt 211 lb (95.7 kg)   SpO2 98%   BMI 37.38 kg/m  Objective:   Physical Exam Pulmonary:     Effort: Pulmonary effort is normal.  Skin:    General: Skin is warm and dry.     Comments: 6 mm raised, light brown, slightly scaly nevus to right lateral/anterior mid thigh.  Neurological:     Mental Status: She is alert.     Physical Exam        Assessment & Plan:  Nevus Assessment & Plan: Appears benign, however, given her smoking history and sun exposure history, we will send off for pathology testing. She agrees.  Written consent obtained. Site cleansed with Betadine solution. Lidocaine  2% with epi was used for analgesia. Nevus was removed with derma blade. Nevus was  placed and pathology testing. Silver nitrate sticks used for bleeding cessation. Dressing applied. Patient tolerated well.  Home instructions provided.  Orders: -     Surgical pathology    Assessment and Plan Assessment & Plan         Comer POUR  Gretta, NP    History of Present Illness

## 2023-12-19 NOTE — Patient Instructions (Addendum)
 Keep the site clean and dry until it heals.  Will be in touch soon regarding the results.  It was a pleasure to see you today!

## 2023-12-21 ENCOUNTER — Ambulatory Visit: Payer: Self-pay | Admitting: Primary Care

## 2023-12-21 DIAGNOSIS — Z1211 Encounter for screening for malignant neoplasm of colon: Secondary | ICD-10-CM

## 2023-12-21 LAB — SURGICAL PATHOLOGY

## 2023-12-24 ENCOUNTER — Other Ambulatory Visit: Payer: Self-pay | Admitting: Primary Care

## 2023-12-24 DIAGNOSIS — G43909 Migraine, unspecified, not intractable, without status migrainosus: Secondary | ICD-10-CM

## 2024-01-05 DIAGNOSIS — F40232 Fear of other medical care: Secondary | ICD-10-CM

## 2024-01-06 MED ORDER — HYDROXYZINE HCL 10 MG PO TABS: ORAL_TABLET | ORAL | 0 refills | Status: AC

## 2024-02-10 ENCOUNTER — Encounter: Payer: Self-pay | Admitting: *Deleted

## 2024-02-14 NOTE — Telephone Encounter (Signed)
 Updated pt's chart. Fyi to [place].

## 2024-03-12 DIAGNOSIS — Z1211 Encounter for screening for malignant neoplasm of colon: Secondary | ICD-10-CM

## 2024-03-15 ENCOUNTER — Encounter: Admission: RE | Payer: Self-pay | Source: Home / Self Care

## 2024-03-15 ENCOUNTER — Ambulatory Visit: Admission: RE | Admit: 2024-03-15 | Source: Home / Self Care | Admitting: Gastroenterology

## 2024-03-15 SURGERY — COLONOSCOPY
Anesthesia: General

## 2024-03-22 ENCOUNTER — Other Ambulatory Visit: Payer: Self-pay | Admitting: Primary Care

## 2024-03-22 DIAGNOSIS — G43909 Migraine, unspecified, not intractable, without status migrainosus: Secondary | ICD-10-CM

## 2024-05-22 ENCOUNTER — Encounter: Admitting: Primary Care
